# Patient Record
Sex: Female | Born: 1970
Health system: Southern US, Community
[De-identification: ages and names within clinical notes are randomized; demographics above are authoritative.]

## PROBLEM LIST (undated history)

## (undated) DIAGNOSIS — J45909 Unspecified asthma, uncomplicated: Secondary | ICD-10-CM

## (undated) DIAGNOSIS — J302 Other seasonal allergic rhinitis: Secondary | ICD-10-CM

## (undated) DIAGNOSIS — K219 Gastro-esophageal reflux disease without esophagitis: Secondary | ICD-10-CM

## (undated) HISTORY — PX: ABDOMINAL HYSTERECTOMY: SHX81

## (undated) HISTORY — PX: BLADDER SURGERY: SHX569

## (undated) HISTORY — PX: OTHER SURGICAL HISTORY: SHX169

## (undated) HISTORY — PX: BACK SURGERY: SHX140

## (undated) HISTORY — DX: Unspecified asthma, uncomplicated: J45.909

---

## 2003-03-09 ENCOUNTER — Encounter: Payer: Self-pay | Admitting: Otolaryngology

## 2003-03-09 ENCOUNTER — Ambulatory Visit (HOSPITAL_COMMUNITY): Admission: RE | Admit: 2003-03-09 | Discharge: 2003-03-09 | Payer: Self-pay | Admitting: Otolaryngology

## 2003-10-21 ENCOUNTER — Ambulatory Visit (HOSPITAL_COMMUNITY): Admission: RE | Admit: 2003-10-21 | Discharge: 2003-10-21 | Payer: Self-pay | Admitting: Family Medicine

## 2004-06-22 ENCOUNTER — Ambulatory Visit (HOSPITAL_COMMUNITY): Admission: RE | Admit: 2004-06-22 | Discharge: 2004-06-22 | Payer: Self-pay | Admitting: Internal Medicine

## 2004-07-15 ENCOUNTER — Ambulatory Visit (HOSPITAL_COMMUNITY): Admission: RE | Admit: 2004-07-15 | Discharge: 2004-07-15 | Payer: Self-pay | Admitting: Internal Medicine

## 2004-09-23 ENCOUNTER — Ambulatory Visit (HOSPITAL_COMMUNITY): Admission: RE | Admit: 2004-09-23 | Discharge: 2004-09-23 | Payer: Self-pay | Admitting: Family Medicine

## 2004-10-30 ENCOUNTER — Ambulatory Visit: Payer: Self-pay | Admitting: Pulmonary Disease

## 2004-10-30 ENCOUNTER — Ambulatory Visit: Admission: RE | Admit: 2004-10-30 | Discharge: 2004-10-30 | Payer: Self-pay | Admitting: Family Medicine

## 2005-05-18 ENCOUNTER — Ambulatory Visit: Payer: Self-pay | Admitting: Psychiatry

## 2008-08-25 ENCOUNTER — Ambulatory Visit (HOSPITAL_COMMUNITY): Admission: RE | Admit: 2008-08-25 | Discharge: 2008-08-25 | Payer: Self-pay | Admitting: Family Medicine

## 2009-05-12 ENCOUNTER — Ambulatory Visit: Payer: Self-pay | Admitting: Orthopedic Surgery

## 2009-05-12 DIAGNOSIS — Q762 Congenital spondylolisthesis: Secondary | ICD-10-CM | POA: Insufficient documentation

## 2009-05-12 DIAGNOSIS — M549 Dorsalgia, unspecified: Secondary | ICD-10-CM | POA: Insufficient documentation

## 2009-05-14 ENCOUNTER — Encounter: Payer: Self-pay | Admitting: Orthopedic Surgery

## 2009-05-27 ENCOUNTER — Encounter (HOSPITAL_COMMUNITY): Admission: RE | Admit: 2009-05-27 | Discharge: 2009-06-26 | Payer: Self-pay | Admitting: Orthopedic Surgery

## 2009-06-17 ENCOUNTER — Encounter: Payer: Self-pay | Admitting: Orthopedic Surgery

## 2009-06-23 ENCOUNTER — Ambulatory Visit: Payer: Self-pay | Admitting: Orthopedic Surgery

## 2009-07-27 ENCOUNTER — Ambulatory Visit (HOSPITAL_BASED_OUTPATIENT_CLINIC_OR_DEPARTMENT_OTHER): Admission: RE | Admit: 2009-07-27 | Discharge: 2009-07-27 | Payer: Self-pay | Admitting: Urology

## 2009-12-04 HISTORY — PX: HIGH RISK BREAST EXCISION: SHX6773

## 2009-12-27 ENCOUNTER — Ambulatory Visit (HOSPITAL_COMMUNITY): Admission: RE | Admit: 2009-12-27 | Discharge: 2009-12-27 | Payer: Self-pay | Admitting: Family Medicine

## 2010-05-12 ENCOUNTER — Inpatient Hospital Stay (HOSPITAL_COMMUNITY): Admission: RE | Admit: 2010-05-12 | Discharge: 2010-05-14 | Payer: Self-pay | Admitting: Neurosurgery

## 2010-10-04 ENCOUNTER — Ambulatory Visit (HOSPITAL_COMMUNITY): Admission: RE | Admit: 2010-10-04 | Discharge: 2010-10-04 | Payer: Self-pay | Admitting: Family Medicine

## 2010-10-19 ENCOUNTER — Ambulatory Visit (HOSPITAL_COMMUNITY): Admission: RE | Admit: 2010-10-19 | Discharge: 2010-10-19 | Payer: Self-pay | Admitting: Family Medicine

## 2010-10-24 ENCOUNTER — Encounter: Admission: RE | Admit: 2010-10-24 | Discharge: 2010-10-24 | Payer: Self-pay | Admitting: Family Medicine

## 2010-11-21 ENCOUNTER — Ambulatory Visit
Admission: RE | Admit: 2010-11-21 | Discharge: 2010-11-21 | Payer: Self-pay | Source: Home / Self Care | Attending: General Surgery | Admitting: General Surgery

## 2010-11-21 ENCOUNTER — Encounter
Admission: RE | Admit: 2010-11-21 | Discharge: 2010-11-21 | Payer: Self-pay | Source: Home / Self Care | Attending: General Surgery | Admitting: General Surgery

## 2010-12-21 ENCOUNTER — Ambulatory Visit: Payer: Self-pay | Admitting: Oncology

## 2011-01-05 NOTE — Assessment & Plan Note (Signed)
Summary: 6 WK RE-CK BACK FOL'G PT/BCBS/CAF   Primary Provider:  Dr Lubertha South    History of Present Illness: I saw Brenda [name] in the office today for a followup visit.  She is a 40 years old woman with the complaint of:  6 week recheck on back after PT and taking neurontin.  Doing better, has been discharged from PT.  Neurontin helps.  No radiaiton of pain anymore.   HISTORY 40 year old female with LEFT hip pain for the past 5 months.  Patient does note history of various episodes of lower back pain as well.  Presents now with pain in the LEFT hip radiating down the LEFT thigh and pain over the LEFT greater trochanter with some aching there.  She denies groin pain, numbness or tingling.  It does bother her to lie on the LEFT side.  Denies stiffness.  She tried some ibuprofen 600 mg at night as well as a Dosepak did not get much relief   She is employed at [employer] and does a lot of lifting.  She had scoliosis as a child.   Allergies: No Known Drug Allergies  Review of Systems Neuro:  Complains of unsteady walking; denies numbness. MS:  Denies joint pain.   Detailed Back/Spine Exam  General:    Well-developed, well-nourished, in no acute distress; alert and oriented x 3.    Gait:    Normal heel-toe gait pattern bilaterally.    Skin:    Intact with no erythema; no scarring.    Inspection:    plantigrade foot with normal alignment of leg, ankle, hindfoot, and foot.   Palpation:    tender over the left lower back   Vascular:    dorsalis pedis and posterior tibial pulses 2+ and symmetric, capillary refill < 2 seconds, normal hair pattern, no evidence of ischemia.   Lumbosacral Exam:  Inspection-deformity:    Normal Palpation-spinal tenderness:  Abnormal Lying Straight Leg Raise:    Right:  negative    Left:  negative Toe Walking:    Right:  normal    Left:  normal Heel Walking:    Right:  normal    Left:  normal   Impression & Recommendations:   Problem # 1:  SPONDYLOLITHESIS (EAV-409.81)  Orders: Est. Patient Level III (19147)  Problem # 2:  SPONDYLOLYSIS (ICD-756.11)  Orders: Est. Patient Level III (82956)  Problem # 3:  BACK PAIN (ICD-724.5)  Orders: Est. Patient Level III (21308)  Patient Instructions: 1)  continue medicne as you can  2)  continue exercises at home  3)  schedule follow ups as  needed  Prescriptions: NEURONTIN 100 MG CAPS (GABAPENTIN) one by mouth tonite 2 tabs thurs nite 3 tabs friday nite and continue  #90 x 5   Entered and Authorized by:   Fuller Canada MD   Signed by:   Fuller Canada MD on 06/23/2009   Method used:   Print then Give to Patient   RxID:   [id number]

## 2011-01-05 NOTE — Letter (Signed)
Summary: *Orthopedic Consult Note  [facility]  [address] [place] [address]  [place], [place] [address]   Phone: [phone]  Fax: [phone]    Re:    NADEZHDA POLLITT DOB:    06-25-1971   Dear: she   Thank you for requesting that we see the above patient for consultation.  A copy of the detailed office note will be sent under separate cover, for your review.  Evaluation today is consistent with:  1)  SPONDYLOLITHESIS (ICD-756.12) 2)  SPONDYLOLYSIS (ICD-756.11) 3)  BACK PAIN (ICD-724.5)   Our recommendation is for: physical therapy and Neurontin titrated from 100 mg up to 300 mg at night to start the need to go to 3 times q. day dosing were switched her Lyrica Neurontin not tolerated  Physical therapy very important  No surgical treatment at this time but MRI may be needed in the future if pain becomes more symptomatic below the knee.       Thank you for this opportunity to look after your patient.  Sincerely,   Terrance Mass. MD.

## 2011-01-05 NOTE — Letter (Signed)
Summary: History form  History form   Imported By: Jacklynn Ganong 05/14/2009 09:18:13  _____________________________________________________________________  External Attachment:    Type:   Image     Comment:   External Document

## 2011-01-05 NOTE — Miscellaneous (Signed)
Summary: PT Discharge summary  PT Discharge summary   Imported By: Jacklynn Ganong 06/24/2009 15:23:08  _____________________________________________________________________  External Attachment:    Type:   Image     Comment:   External Document

## 2011-01-05 NOTE — Assessment & Plan Note (Signed)
Summary: left hip pain needs xr/bcbs/luking/bsf   Vital Signs:  Patient profile:   40 year old female Weight:      203 pounds Pulse (ortho):   70 / minute Resp:     16 per minute  Vitals Entered By: Fuller Canada MD (May 12, 2009 10:15 AM)  Visit Type:  Initial visit Primary Provider:  Dr Lubertha South   CC:  left hip pain.  History of Present Illness: 40 year old female with LEFT hip pain for the past 5 months.  Patient does note history of various episodes of lower back pain as well.  Presents now with pain in the LEFT hip radiating down the LEFT thigh and pain over the LEFT greater trochanter with some aching there.  She denies groin pain, numbness or tingling.  It does bother her to lie on the LEFT side.  Denies stiffness.  She tried some ibuprofen 600 mg at night as well as a Dosepak did not get much relief   She is employed at [employer] and does a lot of lifting.  She had scoliosis as a child.   Preventive Screening-Counseling & Management  Alcohol-Tobacco     Alcohol drinks/day: 0     Smoking Status: never  Caffeine-Diet-Exercise     Caffeine use/day: 3  Allergies (verified): No Known Drug Allergies  Past History:  Past Medical History: acid reflux  Past Surgical History: partial hysterectomy  Family History: na  Social History: Patient is married.  Lowes Home improvementAlcohol drinks/day:  0 Smoking Status:  never Caffeine use/day:  3  Review of Systems General:  Complains of fatigue; denies weight loss, weight gain, fever, and chills. Cardiac :  Denies chest pain, angina, heart attack, heart failure, poor circulation, blood clots, and phlebitis. Resp:  Denies short of breath, difficulty breathing, COPD, cough, and pneumonia. GI:  Denies nausea, vomiting, diarrhea, constipation, difficulty swallowing, ulcers, GERD, and reflux. GU:  Denies kidney failure, kidney transplant, kidney stones, burning, poor stream, testicular cancer, blood in urine,  and . Neuro:  Denies headache, dizziness, migraines, numbness, weakness, tremor, and unsteady walking. MS:  Complains of joint pain and joint swelling; denies rheumatoid arthritis, gout, bone cancer, osteoporosis, and . Endo:  Denies thyroid disease, goiter, and diabetes. Psych:  Complains of depression and anxiety; denies mood swings, panic attack, bipolar, and schizophrenia. Derm:  Denies eczema, cancer, and itching. EENT:  Denies poor vision, cataracts, glaucoma, poor hearing, vertigo, ears ringing, sinusitis, hoarseness, toothaches, and bleeding gums. Immunology:  Complains of seasonal allergies and sinus problems; denies allergic to bee stings. Lymphatic:  Denies lymph node cancer and lymph edema.  Physical Exam  Additional Exam:  She is a pleasant slightly overweight well-developed well-nourished female with no form and these.  She has normal temperature to her extremities within normal pulses  She is awake alert and oriented x3 with normal mood  Reflexes and coordination are normal as is sensation.  Skin shows no lesions rashes or ulcers  Lymph nodes negative  Gait analysis is normal  Lumbar exam shows tenderness in the lower lumbar segments and LEFT buttock.  Spinal motion is preserved without spasm or tightness.  Lower extremities show normal range of motion, normal strength, no instability, no tenderness except over the LEFT greater trochanter  Her extremities normal.   Impression & Recommendations:  Problem # 1:  SPONDYLOLITHESIS (ZOX-096.04)  Orders: Physical Therapy Referral (PT) New Patient Level III (54098)  Problem # 2:  SPONDYLOLYSIS (JXB-147.82)  Orders: Physical Therapy Referral (PT) New  Patient Level III (16109)  Problem # 3:  BACK PAIN (ICD-724.5) x-rays lumbar spine 3 views There is spondylolysis at L5-S1 as well as spondylolisthesis grade 1, there is rounding of the sacral superior vertebrae  Impression spondylolysis with grade 1  spondylolisthesis and some deformity of the S1 superior endplate.  She has a symptomatic spondylolysis listhesis and back pain with leg pain which is above the knee at this time.  We should be able to manage with physical therapy and Neurontin titrated up to 300 mg.  Follow up 6 weeks.  May need MRI in the future if pain becomes more symptomatic or goes below the knee.   Orders: Physical Therapy Referral (PT) New Patient Level III (60454) Lumbosacral Spine ,2/3 views (72100)  Medications Added to Medication List This Visit: 1)  Neurontin 100 Mg Caps (Gabapentin) .... One by mouth tonite 2 tabs thurs nite 3 tabs friday nite and continue  Patient Instructions: 1)  Neurontin take 100 mg at night the 1st nigth then 2 the next night then 3 tabs at night  2)  PT  3)  return in 6 weeks  Prescriptions: NEURONTIN 100 MG CAPS (GABAPENTIN) one by mouth tonite 2 tabs thurs nite 3 tabs friday nite and continue  #90 x 0   Entered and Authorized by:   Fuller Canada MD   Signed by:   Fuller Canada MD on 05/12/2009   Method used:   Handwritten   RxID:   [id number]

## 2011-01-17 ENCOUNTER — Encounter (HOSPITAL_BASED_OUTPATIENT_CLINIC_OR_DEPARTMENT_OTHER): Payer: BC Managed Care – PPO | Admitting: Oncology

## 2011-01-17 DIAGNOSIS — Z8049 Family history of malignant neoplasm of other genital organs: Secondary | ICD-10-CM

## 2011-01-17 DIAGNOSIS — N6089 Other benign mammary dysplasias of unspecified breast: Secondary | ICD-10-CM

## 2011-01-17 DIAGNOSIS — R920 Mammographic microcalcification found on diagnostic imaging of breast: Secondary | ICD-10-CM

## 2011-02-13 LAB — CBC
HCT: 38.4 % (ref 36.0–46.0)
Hemoglobin: 12.8 g/dL (ref 12.0–15.0)
MCH: 28.1 pg (ref 26.0–34.0)
MCHC: 33.3 g/dL (ref 30.0–36.0)
MCV: 84.2 fL (ref 78.0–100.0)
Platelets: 316 10*3/uL (ref 150–400)
RBC: 4.56 MIL/uL (ref 3.87–5.11)
RDW: 13.5 % (ref 11.5–15.5)
WBC: 7.7 10*3/uL (ref 4.0–10.5)

## 2011-02-13 LAB — BASIC METABOLIC PANEL
BUN: 7 mg/dL (ref 6–23)
CO2: 27 mEq/L (ref 19–32)
Calcium: 8.8 mg/dL (ref 8.4–10.5)
Chloride: 106 mEq/L (ref 96–112)
Creatinine, Ser: 0.85 mg/dL (ref 0.4–1.2)
GFR calc Af Amer: >60 mL/min (ref >60)
GFR calc non Af Amer: >60 mL/min (ref >60)
Glucose, Bld: 104 mg/dL — ABNORMAL HIGH (ref 70–99)
Potassium: 3.8 mEq/L (ref 3.5–5.1)
Sodium: 139 mEq/L (ref 135–145)

## 2011-02-13 LAB — DIFFERENTIAL
Basophils Absolute: 0.1 10*3/uL (ref 0.0–0.1)
Basophils Relative: 1 % (ref 0–1)
Eosinophils Absolute: 0.2 10*3/uL (ref 0.0–0.7)
Eosinophils Relative: 3 % (ref 0–5)
Lymphocytes Relative: 25 % (ref 12–46)
Lymphs Abs: 1.9 10*3/uL (ref 0.7–4.0)
Monocytes Absolute: 0.5 10*3/uL (ref 0.1–1.0)
Monocytes Relative: 6 % (ref 3–12)
Neutro Abs: 5.1 10*3/uL (ref 1.7–7.7)
Neutrophils Relative %: 66 % (ref 43–77)

## 2011-02-20 LAB — CBC
HCT: 41.2 % (ref 36.0–46.0)
Hemoglobin: 14.3 g/dL (ref 12.0–15.0)
MCHC: 34.7 g/dL (ref 30.0–36.0)
MCV: 88.5 fL (ref 78.0–100.0)
Platelets: 286 10*3/uL (ref 150–400)
RBC: 4.66 MIL/uL (ref 3.87–5.11)
RDW: 13.8 % (ref 11.5–15.5)
WBC: 8.3 10*3/uL (ref 4.0–10.5)

## 2011-02-20 LAB — TYPE AND SCREEN
ABO/RH(D): A POS
Antibody Screen: NEGATIVE

## 2011-02-20 LAB — ABO/RH: ABO/RH(D): A POS

## 2011-02-20 LAB — SURGICAL PCR SCREEN
MRSA, PCR: NEGATIVE
Staphylococcus aureus: POSITIVE — AB

## 2011-03-11 LAB — APTT: aPTT: 27 seconds (ref 24–37)

## 2011-03-11 LAB — CBC
HCT: 39.6 % (ref 36.0–46.0)
Hemoglobin: 13.4 g/dL (ref 12.0–15.0)
MCHC: 33.9 g/dL (ref 30.0–36.0)
MCV: 89.2 fL (ref 78.0–100.0)
Platelets: 348 10*3/uL (ref 150–400)
RBC: 4.44 MIL/uL (ref 3.87–5.11)
RDW: 13.9 % (ref 11.5–15.5)
WBC: 8.5 10*3/uL (ref 4.0–10.5)

## 2011-03-11 LAB — PROTIME-INR
INR: 1 (ref 0.00–1.49)
Prothrombin Time: 12.9 seconds (ref 11.6–15.2)

## 2011-03-11 LAB — ABO/RH: ABO/RH(D): A POS

## 2011-03-11 LAB — TYPE AND SCREEN
ABO/RH(D): A POS
Antibody Screen: NEGATIVE

## 2011-04-18 NOTE — Op Note (Signed)
Brenda [name], Brenda [name]                 ACCOUNT NO.:  [id number]   MEDICAL RECORD NO.:  [id number]          PATIENT TYPE:  AMB   LOCATION:  [facility]                         FACILITY:  [facility]   PHYSICIAN:  Martina Sinner, MD DATE OF BIRTH:  1971-02-18   DATE OF PROCEDURE:  DATE OF DISCHARGE:                               OPERATIVE REPORT   PREOPERATIVE DIAGNOSIS:  Stress urinary incontinence.   POSTOPERATIVE DIAGNOSIS:  Stress urinary incontinence.   SURGERY:  Sling, cystourethropexy plus cystoscopy (after sling  cystourethropexy, SPARC).   Ms. [name] has stress urinary incontinence.  She has a mixed  component.  She is refractory to anticholinergics.  She was prepped and  draped in the usual fashion.  Extra care was taken to leg positioning to  minimize risk compartment syndrome, neuropathy and DVT.  She was given  preoperative antibiotics and her lab tests were normal.   A weighted vaginal speculum __________ were utilized.  Two 1-cm  incisions were made one fingerbreadth above the symphysis pubis 1.5 cm  lateral to the midline.   A 2 cm incision was made underneath the mid urethra after instilling 7  mL of lidocaine/epinephrine mixture.  I dissected sharply to the  urethrovesical angle bilaterally.  With the bladder empty, I passed a  SPARC needle on top of and behind the back of the symphysis pubis on the  pulp of my index finger bilaterally, staying a little bit lateral.   I then cystoscoped the patient.  There was no injury to the bladder or  urethra and there was excellent efflux of blue dye bilaterally.  There  was no indentation of bladder with wiggling of the trocars.   With the bladder empty, I attached the [facility] sling and brought it up to  the retropubic space.  I tensioned over the fat part of a moderate-sized  Kelly clamp.  I cut below the blue dots and removed the sheath.  I was  happy with the position and hypermobility and lack of spring back of the  mid  urethral sling.  Copious irrigation was utilized with antibiotics.   I closed the vaginal incision with running 2-0 Vicryl on a CT1 needle  followed by two interrupted sutures.  I cut the sling below the skin  level in the suprapubic area used one interrupted 4-0 Vicryl followed by  Dermabond.   A vaginal pack was inserted at the end of the case.  The patient was  taken to the recovery room.  I was very pleased with the procedure and  hopefully it will reach her treatment goals.           ______________________________  Martina Sinner, MD  Electronically Signed    SAM/MEDQ  D:  07/27/2009  T:  07/27/2009  Job:  [id number]

## 2011-04-21 NOTE — Procedures (Signed)
Brenda [name], Brenda [name]                 ACCOUNT NO.:  [id number]   MEDICAL RECORD NO.:  [id number]          PATIENT TYPE:  OUT   LOCATION:  SLEEP LAB                     FACILITY:  [facility]   PHYSICIAN:  Marcelyn Bruins, M.D. [facility] DATE OF BIRTH:  06/18/1971   DATE OF STUDY:  10/30/2004                              NOCTURNAL POLYSOMNOGRAM   REFERRING PHYSICIAN:  Lubertha South   INDICATION FOR THIS STUDY:  Hypersomnia with sleep apnea.   SLEEP ARCHITECTURE:  The patient had a total sleep time of 342 minutes with  no slow wave sleep and normal quantity of REM.  Sleep onset latency was  prolonged at 69 minutes and REM onset was fairly quick.  Sleep efficiency  was 82%.   IMPRESSION:  1.  Very mild obstructive sleep apnea/hypopnea syndrome with a respiratory      disturbance of five events per hour and O2 desaturation as low as 90%.      The events were clearly worse in the supine position and with REM.  The      patient did not meet split night criteria due to the small numbers of      events.  2.  Moderate snoring noted.  3.  No clinically significant cardiac arrhythmia.  4.  Large numbers of periodic leg movements with very little sleep      disruption.  However, clinical correlation is suggested.      KC/MEDQ  D:  11/14/2004 12:43:48  T:  11/14/2004 14:13:55  Job:  [id number]

## 2011-05-04 ENCOUNTER — Other Ambulatory Visit (INDEPENDENT_AMBULATORY_CARE_PROVIDER_SITE_OTHER): Payer: Self-pay | Admitting: General Surgery

## 2011-05-04 DIAGNOSIS — C50911 Malignant neoplasm of unspecified site of right female breast: Secondary | ICD-10-CM

## 2011-05-11 ENCOUNTER — Other Ambulatory Visit (INDEPENDENT_AMBULATORY_CARE_PROVIDER_SITE_OTHER): Payer: Self-pay | Admitting: General Surgery

## 2011-05-11 ENCOUNTER — Ambulatory Visit
Admission: RE | Admit: 2011-05-11 | Discharge: 2011-05-11 | Disposition: A | Discharge status: Home / Self Care | Payer: BC Managed Care – PPO | Source: Ambulatory Visit | Attending: General Surgery | Admitting: General Surgery

## 2011-05-11 DIAGNOSIS — C50911 Malignant neoplasm of unspecified site of right female breast: Secondary | ICD-10-CM

## 2011-05-11 DIAGNOSIS — N6099 Unspecified benign mammary dysplasia of unspecified breast: Secondary | ICD-10-CM

## 2011-07-07 ENCOUNTER — Ambulatory Visit (HOSPITAL_COMMUNITY)
Admission: RE | Admit: 2011-07-07 | Discharge: 2011-07-07 | Disposition: A | Discharge status: Home / Self Care | Payer: BC Managed Care – PPO | Source: Ambulatory Visit | Attending: Family Medicine | Admitting: Family Medicine

## 2011-07-07 ENCOUNTER — Other Ambulatory Visit: Payer: Self-pay | Admitting: Family Medicine

## 2011-07-07 DIAGNOSIS — M25579 Pain in unspecified ankle and joints of unspecified foot: Secondary | ICD-10-CM | POA: Insufficient documentation

## 2011-07-07 DIAGNOSIS — M79671 Pain in right foot: Secondary | ICD-10-CM

## 2011-07-07 DIAGNOSIS — S99929A Unspecified injury of unspecified foot, initial encounter: Secondary | ICD-10-CM | POA: Insufficient documentation

## 2011-07-07 DIAGNOSIS — S8990XA Unspecified injury of unspecified lower leg, initial encounter: Secondary | ICD-10-CM | POA: Insufficient documentation

## 2011-07-07 DIAGNOSIS — W208XXA Other cause of strike by thrown, projected or falling object, initial encounter: Secondary | ICD-10-CM | POA: Insufficient documentation

## 2011-07-07 DIAGNOSIS — S99919A Unspecified injury of unspecified ankle, initial encounter: Secondary | ICD-10-CM | POA: Insufficient documentation

## 2011-12-11 IMAGING — RF DG LUMBAR SPINE 2-3V
1 series · 3 of 3 positions shown · non-contrast
Comparison: Intraoperative images 05/12/2010

CLINICAL DATA: L5-S1 posterior fusion.

LUMBAR SPINE - 2-3 VIEW

[Series 1: run · 3 of 3 slices shown]
[im 1/3]
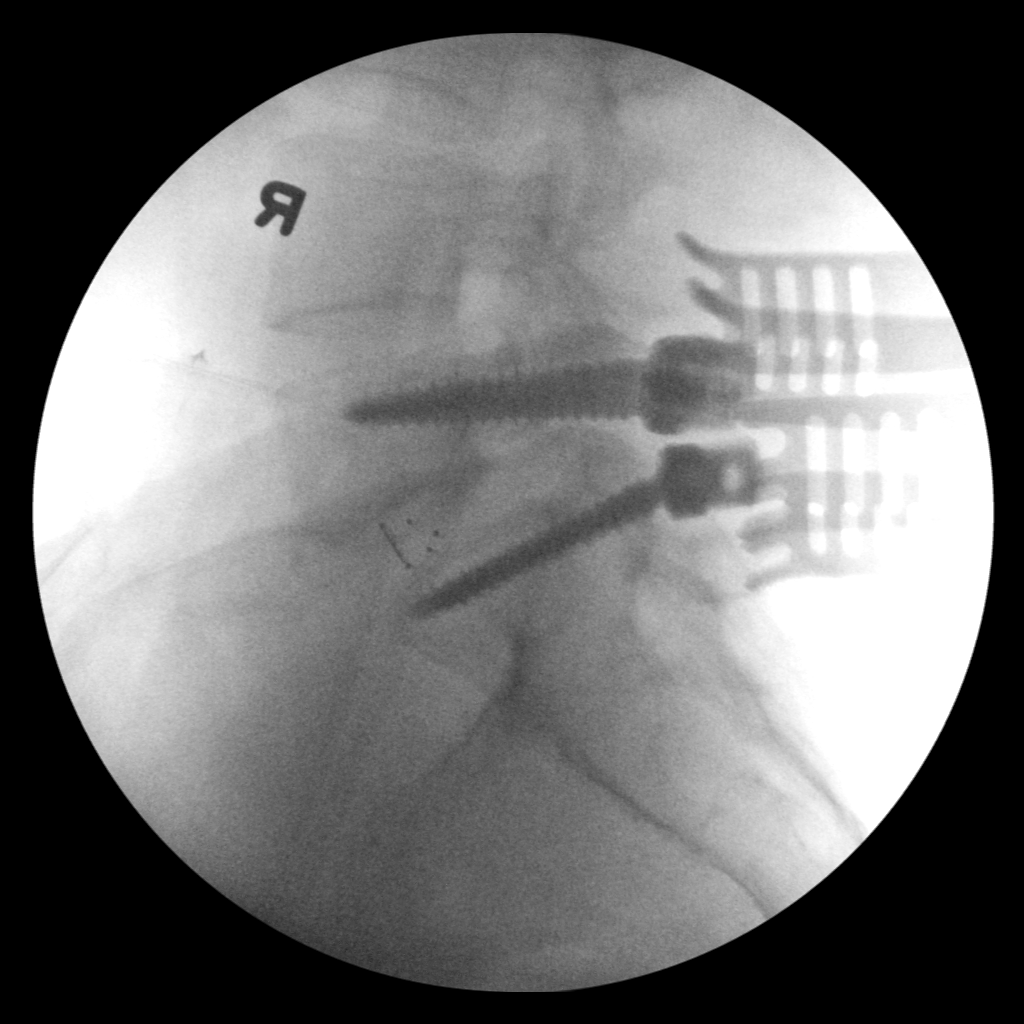
[im 2/3]
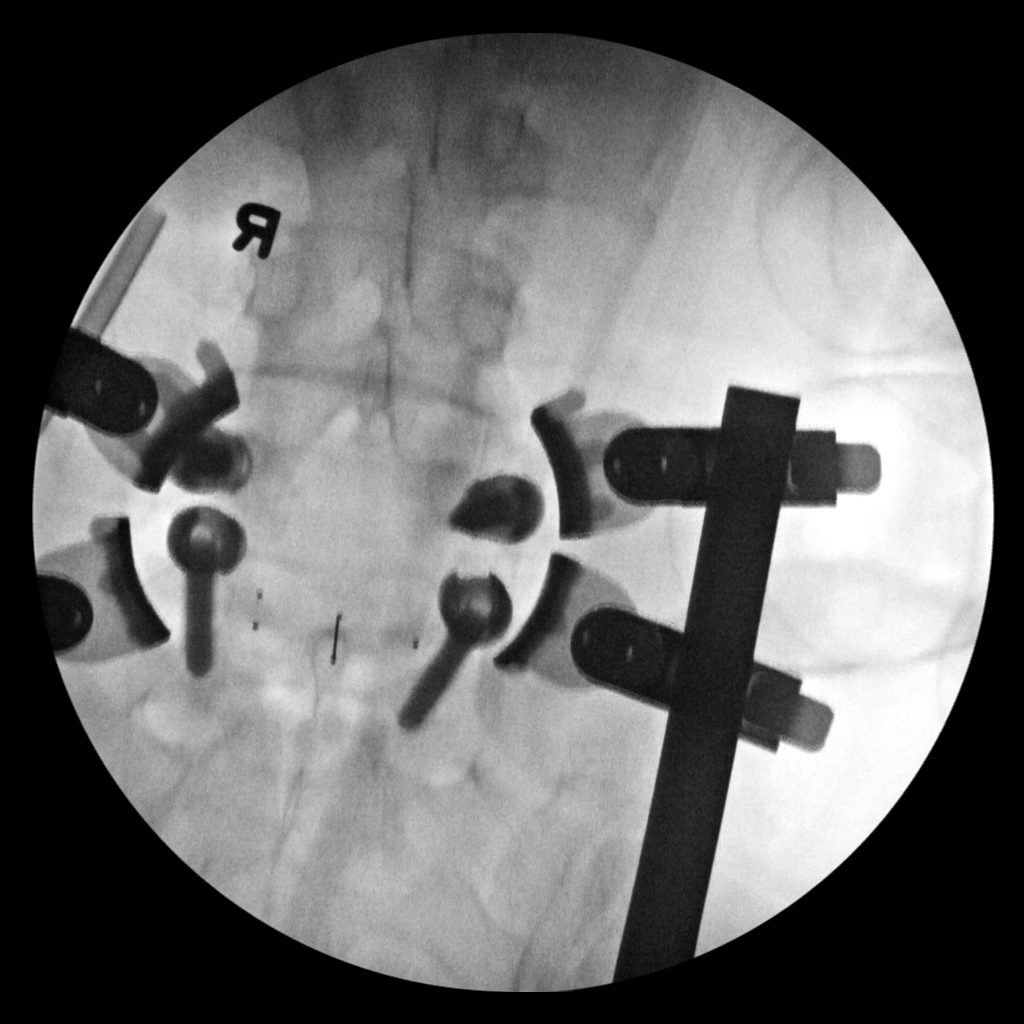
[im 3/3]
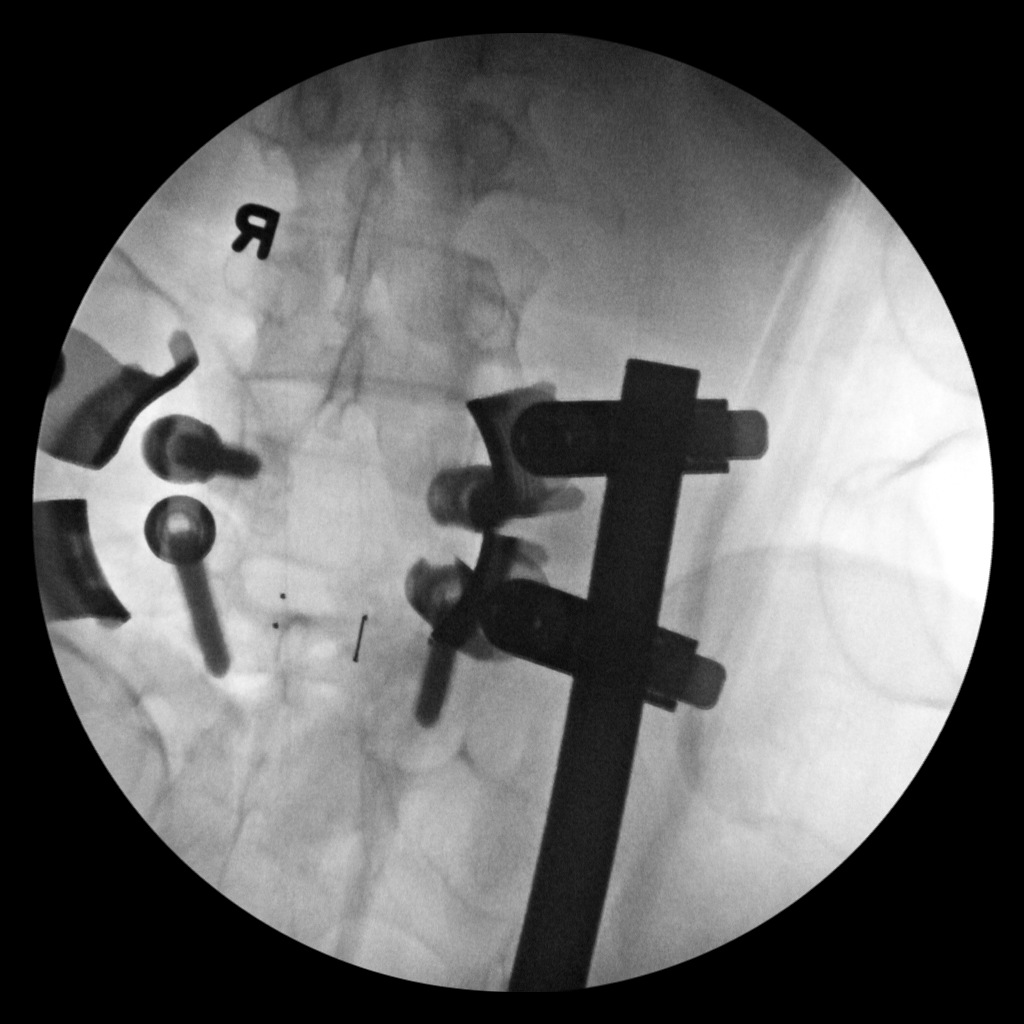

[3 of 3 positions shown; findings below may reference images not displayed]

FINDINGS: Two spot images demonstrate placement of pedicle screws
at L5 and S1.  Slight anterolisthesis of L5 on S1.
IMPRESSION: Pedicle placement at L5 and S1.

## 2011-12-11 IMAGING — CR DG LUMBAR SPINE 1V
1 series · 1 of 1 positions shown · non-contrast
Comparison: 12/27/2009 MRI

CLINICAL DATA: L5-S1 PLIF

LUMBAR SPINE - 1 VIEW

[view not recorded]
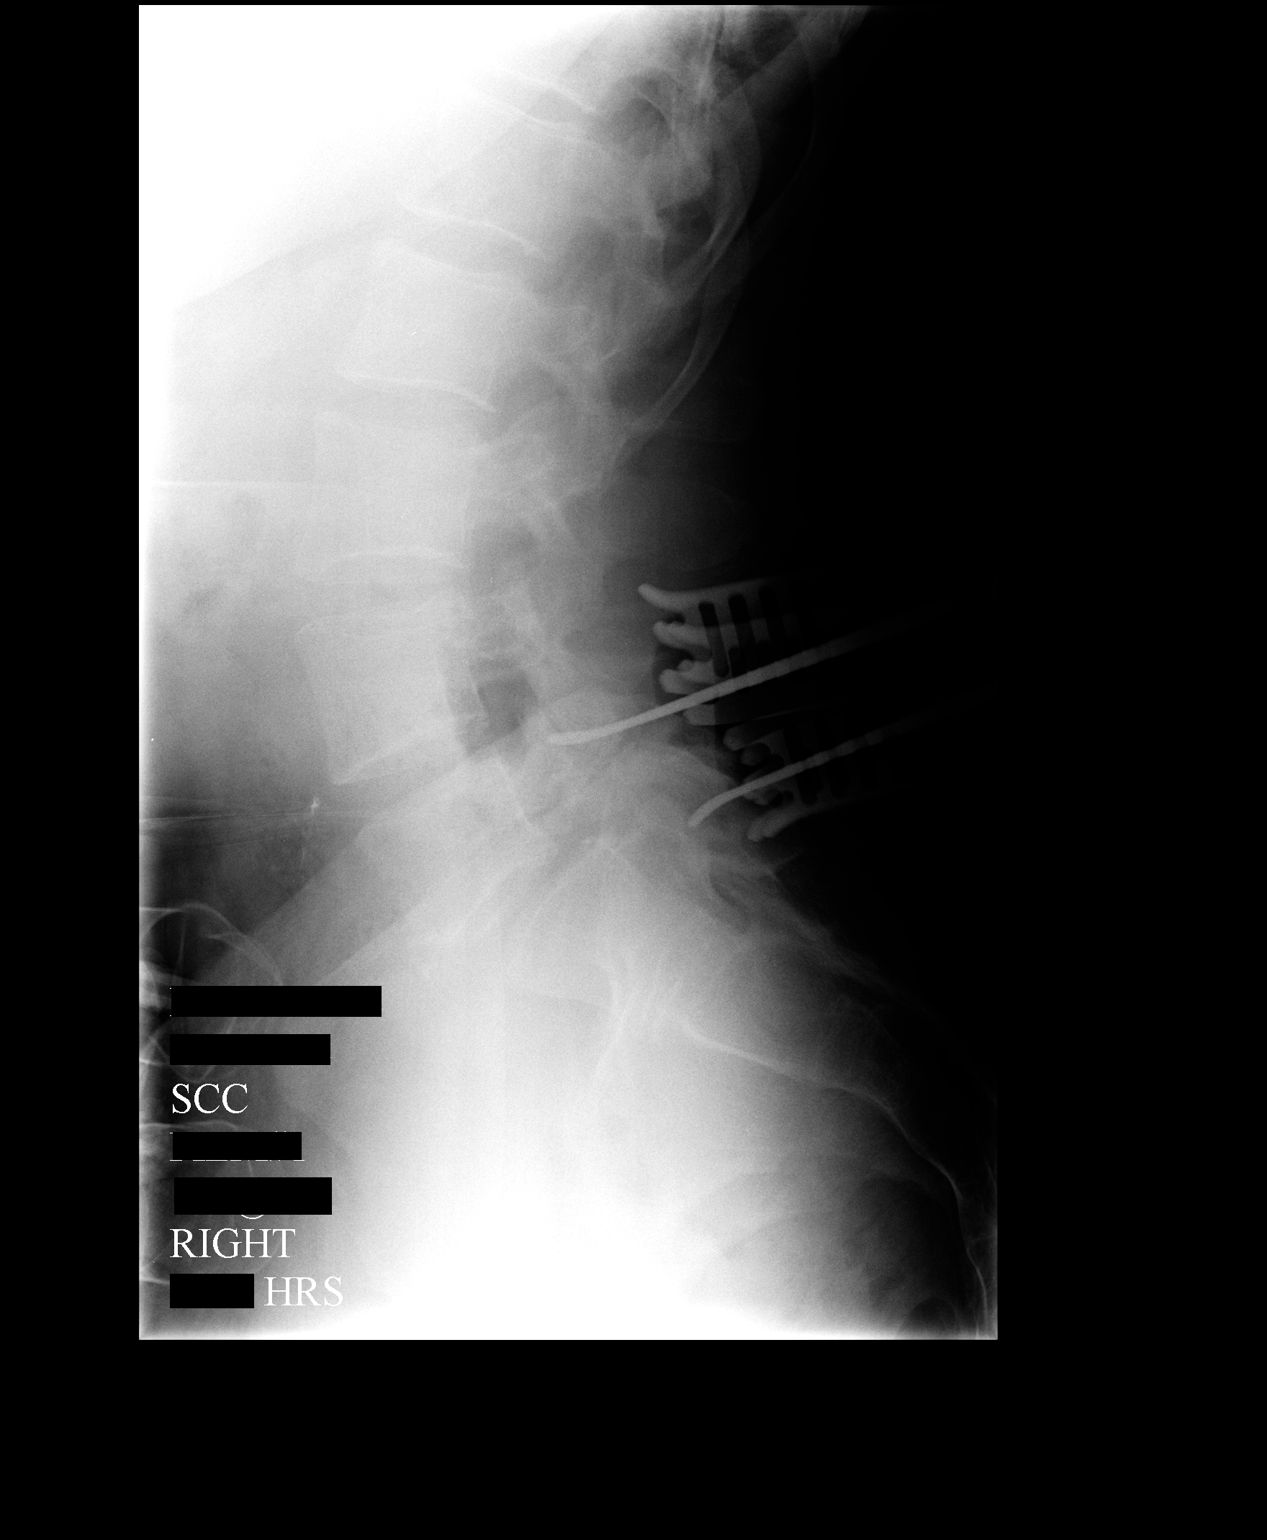

[1 of 1 positions shown; findings below may reference images not displayed]

FINDINGS: A single lateral intraoperative view of the lumbar spine
is submitted postoperatively for interpretation.
Two posterior metallic probes are identified, the superior one
directed at the L4-L5 interspace and the inferior one directed
towards the upper S1 body.
Grade 1 anterolisthesis of L5 on S1 is again noted.
IMPRESSION: Posterior localizers as described.

## 2012-08-06 ENCOUNTER — Other Ambulatory Visit: Payer: Self-pay | Admitting: Family Medicine

## 2012-08-06 DIAGNOSIS — Z1231 Encounter for screening mammogram for malignant neoplasm of breast: Secondary | ICD-10-CM

## 2012-08-09 ENCOUNTER — Ambulatory Visit
Admission: RE | Admit: 2012-08-09 | Discharge: 2012-08-09 | Disposition: A | Discharge status: Home / Self Care | Payer: BC Managed Care – PPO | Source: Ambulatory Visit | Attending: Family Medicine | Admitting: Family Medicine

## 2012-08-09 DIAGNOSIS — Z1231 Encounter for screening mammogram for malignant neoplasm of breast: Secondary | ICD-10-CM

## 2013-10-10 ENCOUNTER — Encounter: Payer: Self-pay | Admitting: Nurse Practitioner

## 2013-10-10 ENCOUNTER — Ambulatory Visit (INDEPENDENT_AMBULATORY_CARE_PROVIDER_SITE_OTHER): Payer: BC Managed Care – PPO | Admitting: Nurse Practitioner

## 2013-10-10 VITALS — BP 148/80 | Temp 98.8°F | Ht 67.0 in | Wt 232.0 lb

## 2013-10-10 DIAGNOSIS — J310 Chronic rhinitis: Secondary | ICD-10-CM

## 2013-10-10 MED ORDER — METHYLPREDNISOLONE ACETATE 40 MG/ML IJ SUSP
40.0000 mg | Freq: Once | INTRAMUSCULAR | Status: AC
  Administered 2013-10-10: 40 mg via INTRAMUSCULAR

## 2013-10-10 MED ORDER — FLUTICASONE PROPIONATE 50 MCG/ACT NA SUSP: 2.0000 | Freq: Every day | NASAL | Status: DC

## 2013-10-10 MED ORDER — CEFUROXIME AXETIL 500 MG PO TABS: 500.0000 mg | ORAL_TABLET | Freq: Two times a day (BID) | ORAL | Status: DC

## 2013-10-10 MED ORDER — PREDNISONE 20 MG PO TABS: ORAL_TABLET | ORAL | Status: DC

## 2013-10-10 NOTE — Patient Instructions (Signed)
Continue Zyrtec Zaditor eye drops ( generic)

## 2013-10-14 ENCOUNTER — Encounter: Payer: Self-pay | Admitting: Nurse Practitioner

## 2013-10-14 ENCOUNTER — Other Ambulatory Visit: Payer: Self-pay

## 2013-10-14 DIAGNOSIS — J310 Chronic rhinitis: Secondary | ICD-10-CM | POA: Insufficient documentation

## 2013-10-14 DIAGNOSIS — Z1231 Encounter for screening mammogram for malignant neoplasm of breast: Secondary | ICD-10-CM

## 2013-10-14 DIAGNOSIS — T485X5A Adverse effect of other anti-common-cold drugs, initial encounter: Secondary | ICD-10-CM | POA: Insufficient documentation

## 2013-10-14 NOTE — Assessment & Plan Note (Signed)
Medications  . methylPREDNISolone acetate (DEPO-MEDROL) injection 40 mg    Sig:   . cefUROXime (CEFTIN) 500 MG tablet    Sig: Take 1 tablet (500 mg total) by mouth 2 (two) times daily with a meal.    Dispense:  20 tablet    Refill:  0    Order Specific Question:  Supervising Provider    Answer:  Merlyn Albert [[id number]]  . predniSONE (DELTASONE) 20 MG tablet    Sig: 3 po qd x 3 d then 2 po qd x 3 d then 1 po qd x 3 d; start 11/8    Dispense:  18 tablet    Refill:  0    Order Specific Question:  Supervising Provider    Answer:  Merlyn Albert [[id number]]  . fluticasone (FLONASE) 50 MCG/ACT nasal spray    Sig: Place 2 sprays into both nostrils daily. Prn congestion    Dispense:  16 g    Refill:  5    Order Specific Question:  Supervising Provider    Answer:  Riccardo Dubin   Advised patient to stop Afrin nasal spray use immediately and to avoid using it in the future. Call back if symptoms worsen or persist. Explained that Flonase is for long-term use if needed.

## 2013-10-14 NOTE — Progress Notes (Signed)
Subjective:  Presents for complaints of intense head congestion over the past 2 months, seems to be worse over the past week. Has been using Afrin daily. Now only lasting about 4 hours before congestion comes back. Over the past week has started running a slight fever with frontal area headache. Nonproductive cough. Producing green mucus mainly in the mornings. No ear pain. No sore throat. Reflux is stable.  Objective:   BP 148/80  Temp(Src) 98.8 F (37.1 C) (Oral)  Ht 5\' 7"  (1.702 m)  Wt 232 lb (105.235 kg)  BMI 36.33 kg/m2  LMP 07/06/2004 NAD. Alert, oriented. TMs significant retraction, clear effusion. Pharynx injected with PND noted. Neck supple with mild soft nontender adenopathy. Nasal mucosa pale and very boggy. Lungs clear. Heart regular rate rhythm.  Assessment:Rhinitis - Plan: methylPREDNISolone acetate (DEPO-MEDROL) injection 40 mg  Rhinitis medicamentosa, initial encounter  Plan:  Meds ordered this encounter  Medications  . methylPREDNISolone acetate (DEPO-MEDROL) injection 40 mg    Sig:   . cefUROXime (CEFTIN) 500 MG tablet    Sig: Take 1 tablet (500 mg total) by mouth 2 (two) times daily with a meal.    Dispense:  20 tablet    Refill:  0    Order Specific Question:  Supervising Provider    Answer:  Merlyn Albert [[id number]]  . predniSONE (DELTASONE) 20 MG tablet    Sig: 3 po qd x 3 d then 2 po qd x 3 d then 1 po qd x 3 d; start 11/8    Dispense:  18 tablet    Refill:  0    Order Specific Question:  Supervising Provider    Answer:  Merlyn Albert [[id number]]  . fluticasone (FLONASE) 50 MCG/ACT nasal spray    Sig: Place 2 sprays into both nostrils daily. Prn congestion    Dispense:  16 g    Refill:  5    Order Specific Question:  Supervising Provider    Answer:  Riccardo Dubin   Advised patient to stop Afrin nasal spray use immediately and to avoid using it in the future. Call back if symptoms worsen or persist. Explained that Flonase is for long-term use if  needed.

## 2013-11-19 ENCOUNTER — Ambulatory Visit
Admission: RE | Admit: 2013-11-19 | Discharge: 2013-11-19 | Disposition: A | Discharge status: Home / Self Care | Payer: BC Managed Care – PPO | Source: Ambulatory Visit

## 2013-11-19 DIAGNOSIS — Z1231 Encounter for screening mammogram for malignant neoplasm of breast: Secondary | ICD-10-CM

## 2014-03-27 ENCOUNTER — Other Ambulatory Visit: Payer: Self-pay | Admitting: Nurse Practitioner

## 2014-04-01 ENCOUNTER — Ambulatory Visit (INDEPENDENT_AMBULATORY_CARE_PROVIDER_SITE_OTHER): Payer: BC Managed Care – PPO | Admitting: Family Medicine

## 2014-04-01 ENCOUNTER — Telehealth: Payer: Self-pay | Admitting: Family Medicine

## 2014-04-01 ENCOUNTER — Encounter: Payer: Self-pay | Admitting: Family Medicine

## 2014-04-01 ENCOUNTER — Ambulatory Visit (HOSPITAL_COMMUNITY)
Admission: RE | Admit: 2014-04-01 | Discharge: 2014-04-01 | Disposition: A | Discharge status: Home / Self Care | Payer: BC Managed Care – PPO | Source: Ambulatory Visit | Attending: Family Medicine | Admitting: Family Medicine

## 2014-04-01 VITALS — BP 112/80 | Temp 98.6°F | Ht 67.0 in | Wt 229.0 lb

## 2014-04-01 DIAGNOSIS — R918 Other nonspecific abnormal finding of lung field: Secondary | ICD-10-CM | POA: Insufficient documentation

## 2014-04-01 DIAGNOSIS — R05 Cough: Secondary | ICD-10-CM

## 2014-04-01 DIAGNOSIS — J309 Allergic rhinitis, unspecified: Secondary | ICD-10-CM

## 2014-04-01 DIAGNOSIS — R059 Cough, unspecified: Secondary | ICD-10-CM

## 2014-04-01 DIAGNOSIS — J45909 Unspecified asthma, uncomplicated: Secondary | ICD-10-CM

## 2014-04-01 DIAGNOSIS — J683 Other acute and subacute respiratory conditions due to chemicals, gases, fumes and vapors: Secondary | ICD-10-CM | POA: Insufficient documentation

## 2014-04-01 MED ORDER — METHYLPREDNISOLONE ACETATE 40 MG/ML IJ SUSP
40.0000 mg | Freq: Once | INTRAMUSCULAR | Status: AC
  Administered 2014-04-01: 40 mg via INTRAMUSCULAR

## 2014-04-01 MED ORDER — CEFDINIR 300 MG PO CAPS: 300.0000 mg | ORAL_CAPSULE | Freq: Two times a day (BID) | ORAL | Status: DC

## 2014-04-01 MED ORDER — ALBUTEROL SULFATE HFA 108 (90 BASE) MCG/ACT IN AERS: 2.0000 | INHALATION_SPRAY | Freq: Four times a day (QID) | RESPIRATORY_TRACT | Status: DC | PRN

## 2014-04-01 MED ORDER — PREDNISONE 20 MG PO TABS: ORAL_TABLET | ORAL | Status: DC

## 2014-04-01 MED ORDER — FLUTICASONE PROPIONATE 50 MCG/ACT NA SUSP: 2.0000 | Freq: Every day | NASAL | Status: DC

## 2014-04-01 MED ORDER — TRIAMCINOLONE ACETONIDE 0.1 % EX CREA: 1.0000 "application " | TOPICAL_CREAM | Freq: Two times a day (BID) | CUTANEOUS | Status: DC

## 2014-04-01 NOTE — Progress Notes (Signed)
Patient notified and verbalized understanding of the test results. No further questions. 

## 2014-04-01 NOTE — Progress Notes (Signed)
   Subjective:    Patient ID: Brenda [name], female    DOB: [birth year], 43 y.o.   MRN: [id number]  Sinusitis This is a new problem. The current episode started more than 1 month ago. The problem has been gradually worsening since onset. There has been no fever. Her pain is at a severity of 7/10. The pain is moderate. Associated symptoms include congestion and coughing. (Wheezing) Past treatments include spray decongestants. The treatment provided no relief.  Rash This is a new problem. The current episode started more than 1 month ago. The problem is unchanged. Pain location: all over body. The rash is characterized by redness and itchiness. She was exposed to nothing. Associated symptoms include congestion and coughing. Past treatments include nothing. The treatment provided no relief.  Patient has no other concerns at this time.   Using decong spray apparently for months.  On claritin, took zyrtec early, didn';t help that much.  alsa plus   Sig pressure in her sinuses.  Patient also notes food clogging in her esophagus intermittently. Has had her esophagus stretched twice in the past. Was placed on proton pump inhibitor but now only using intermittently. No obvious reflux symptomatology. Next. Patient states she's been coughing for a year. This worries her tremendously. Patient does not smoke. He does have secondary smoke exposure  Review of Systems  HENT: Positive for congestion.   Respiratory: Positive for cough.   Skin: Positive for rash.   No chest pain no back pain ROS otherwise negative    Objective:   Physical Exam  Alert no acute distress H&T moderate his congestion. Frontal tenderness. Trace normal neck supple. Lungs currently rare wheeze heart regular in rhythm. Abdomen benign skin nummular eczema-like eruption      Assessment & Plan:  Impression 1 chronic cough discussed likely reactive airways. Somewhat worsened during allergy season #2 allergic rhinitis poor control  discussed #3 rhinitis medicamentosa. #4 solid food dysphagia plan as per orders and inhaler steroids. Chest x-ray. GI consult. Antibiotics. WSL

## 2014-04-01 NOTE — Telephone Encounter (Signed)
Order already placed in for this when pts note dict

## 2014-04-01 NOTE — Telephone Encounter (Signed)
Patient had an appointment today and spoke with Dr. Richardson Landry about getting her setup with the doctor that she seen previously to have her esophagus stretched again. She just wanted to remind you to set this up, because nothing was mentioned about it on her AVS.

## 2014-04-06 ENCOUNTER — Ambulatory Visit: Payer: BC Managed Care – PPO | Admitting: Family Medicine

## 2014-04-08 ENCOUNTER — Encounter: Payer: Self-pay | Admitting: Nurse Practitioner

## 2014-04-08 ENCOUNTER — Ambulatory Visit (INDEPENDENT_AMBULATORY_CARE_PROVIDER_SITE_OTHER): Payer: BC Managed Care – PPO | Admitting: Nurse Practitioner

## 2014-04-08 VITALS — BP 112/88 | Ht 67.0 in | Wt 229.0 lb

## 2014-04-08 DIAGNOSIS — R5383 Other fatigue: Secondary | ICD-10-CM

## 2014-04-08 DIAGNOSIS — R5381 Other malaise: Secondary | ICD-10-CM

## 2014-04-08 DIAGNOSIS — F419 Anxiety disorder, unspecified: Secondary | ICD-10-CM | POA: Insufficient documentation

## 2014-04-08 DIAGNOSIS — F32A Depression, unspecified: Secondary | ICD-10-CM | POA: Insufficient documentation

## 2014-04-08 DIAGNOSIS — F341 Dysthymic disorder: Secondary | ICD-10-CM

## 2014-04-08 DIAGNOSIS — Z13220 Encounter for screening for lipoid disorders: Secondary | ICD-10-CM

## 2014-04-08 DIAGNOSIS — F329 Major depressive disorder, single episode, unspecified: Secondary | ICD-10-CM

## 2014-04-08 MED ORDER — ESCITALOPRAM OXALATE 10 MG PO TABS: 10.0000 mg | ORAL_TABLET | Freq: Every day | ORAL | Status: DC

## 2014-04-08 NOTE — Patient Instructions (Addendum)
My fitness pal Ventral sleeve gastrectomy [facility]

## 2014-04-09 ENCOUNTER — Encounter: Payer: Self-pay | Admitting: Nurse Practitioner

## 2014-04-09 NOTE — Progress Notes (Signed)
Subjective:  Presents for routine follow up. Has been off Lexapro for 2 weeks. Increased anxiety. Increased fatigue. Has had negative testing for sleep apnea in the past. Limited exercise. Not doing well with her diet.  Objective:   BP 112/88  Ht 5\' 7"  (1.702 m)  Wt 229 lb (103.874 kg)  BMI 35.86 kg/m2  LMP 07/06/2004 NAD. Alert, oriented. Lungs clear. Heart RRR. Central obesity.  Assessment:  Problem List Items Addressed This Visit     Other   Anxiety and depression - Primary    Other Visit Diagnoses   Fatigue        Relevant Orders       Basic metabolic panel       CBC with Differential       Hepatic function panel       TSH       Vit D  25 hydroxy (rtn osteoporosis monitoring)    Need for lipid screening        Relevant Orders       Lipid panel      Plan:  Meds ordered this encounter  Medications  . escitalopram (LEXAPRO) 10 MG tablet    Sig: Take 1 tablet (10 mg total) by mouth daily.    Dispense:  30 tablet    Refill:  5    Order Specific Question:  Supervising Provider    Answer:  [name] [[id number]]   Schedule physical for next visit. Encouraged regular activity, healthy diet and weight loss. Discussed weight loss surgery.

## 2014-04-14 ENCOUNTER — Encounter (INDEPENDENT_AMBULATORY_CARE_PROVIDER_SITE_OTHER): Payer: Self-pay | Admitting: *Deleted

## 2014-04-16 ENCOUNTER — Telehealth: Payer: Self-pay | Admitting: Family Medicine

## 2014-04-16 NOTE — Telephone Encounter (Signed)
Pt still with cough but it's some better, taking her reflux medicine, having chest pain all the way across, thought it may be gas, ongoing for 3 days, really can't afford to come in or go to ER, please advise

## 2014-04-17 NOTE — Telephone Encounter (Signed)
Spoke with patient. Encouraged her to go to ER for the chest pain and that it should not wait until later this evening or any longer. Pt verbalized understanding.

## 2014-04-17 NOTE — Telephone Encounter (Signed)
According to my notes, I did not see patient for these symptoms. I recommend that she be evaluated at our office or local urgent care. Also make sure she is taking her reflux medicine.

## 2014-04-20 ENCOUNTER — Telehealth: Payer: Self-pay | Admitting: Family Medicine

## 2014-04-20 NOTE — Telephone Encounter (Signed)
Will send message when her results come in. Please make sure she has signed up for my chart. Thanks.

## 2014-04-20 NOTE — Telephone Encounter (Signed)
Patient had to cancel her physical this Friday due to starting a new job. She would like you to send email through my chart to tell her the blood work results.

## 2014-04-22 ENCOUNTER — Encounter: Payer: Self-pay | Admitting: Nurse Practitioner

## 2014-04-22 LAB — LIPID PANEL
Cholesterol: 228 mg/dL — ABNORMAL HIGH (ref 0–200)
HDL: 55 mg/dL (ref >39)
LDL Cholesterol: 148 mg/dL — ABNORMAL HIGH (ref 0–99)
Total CHOL/HDL Ratio: 4.1 Ratio
Triglycerides: 125 mg/dL (ref <150)
VLDL: 25 mg/dL (ref 0–40)

## 2014-04-22 LAB — CBC WITH DIFFERENTIAL/PLATELET
Basophils Absolute: 0.1 10*3/uL (ref 0.0–0.1)
Basophils Relative: 1 % (ref 0–1)
Eosinophils Absolute: 0.7 10*3/uL (ref 0.0–0.7)
Eosinophils Relative: 8 % — ABNORMAL HIGH (ref 0–5)
HCT: 39.2 % (ref 36.0–46.0)
Hemoglobin: 12.9 g/dL (ref 12.0–15.0)
Lymphocytes Relative: 26 % (ref 12–46)
Lymphs Abs: 2.3 10*3/uL (ref 0.7–4.0)
MCH: 27.2 pg (ref 26.0–34.0)
MCHC: 32.9 g/dL (ref 30.0–36.0)
MCV: 82.5 fL (ref 78.0–100.0)
Monocytes Absolute: 0.4 10*3/uL (ref 0.1–1.0)
Monocytes Relative: 5 % (ref 3–12)
Neutro Abs: 5.2 10*3/uL (ref 1.7–7.7)
Neutrophils Relative %: 60 % (ref 43–77)
Platelets: 314 10*3/uL (ref 150–400)
RBC: 4.75 MIL/uL (ref 3.87–5.11)
RDW: 15.7 % — ABNORMAL HIGH (ref 11.5–15.5)
WBC: 8.7 10*3/uL (ref 4.0–10.5)

## 2014-04-22 LAB — TSH: TSH: 4.067 u[IU]/mL (ref 0.350–4.500)

## 2014-04-22 LAB — BASIC METABOLIC PANEL
BUN: 8 mg/dL (ref 6–23)
CO2: 33 mEq/L — ABNORMAL HIGH (ref 19–32)
Calcium: 9.2 mg/dL (ref 8.4–10.5)
Chloride: 104 mEq/L (ref 96–112)
Creat: 0.8 mg/dL (ref 0.50–1.10)
Glucose, Bld: 87 mg/dL (ref 70–99)
Potassium: 4.2 mEq/L (ref 3.5–5.3)
Sodium: 141 mEq/L (ref 135–145)

## 2014-04-22 LAB — HEPATIC FUNCTION PANEL
ALT: 20 U/L (ref 0–35)
AST: 20 U/L (ref 0–37)
Albumin: 3.8 g/dL (ref 3.5–5.2)
Alkaline Phosphatase: 81 U/L (ref 39–117)
Bilirubin, Direct: 0.1 mg/dL (ref 0.0–0.3)
Indirect Bilirubin: 0.3 mg/dL (ref 0.2–1.2)
Total Bilirubin: 0.4 mg/dL (ref 0.2–1.2)
Total Protein: 6.7 g/dL (ref 6.0–8.3)

## 2014-04-22 LAB — VITAMIN D 25 HYDROXY (VIT D DEFICIENCY, FRACTURES): Vit D, 25-Hydroxy: 37 ng/mL (ref 30–89)

## 2014-04-24 ENCOUNTER — Encounter: Payer: BC Managed Care – PPO | Admitting: Nurse Practitioner

## 2014-04-30 ENCOUNTER — Telehealth: Payer: Self-pay | Admitting: Family Medicine

## 2014-04-30 MED ORDER — BENZONATATE 100 MG PO CAPS: 100.0000 mg | ORAL_CAPSULE | Freq: Three times a day (TID) | ORAL | Status: DC | PRN

## 2014-04-30 MED ORDER — CLARITHROMYCIN 500 MG PO TABS
500.0000 mg | ORAL_TABLET | Freq: Two times a day (BID) | ORAL | Status: DC
Start: 2014-04-30 — End: 2014-05-08

## 2014-04-30 NOTE — Telephone Encounter (Signed)
biaxin 500 bid ten d, tess perle 100 numb thirty one q 6 prn cough

## 2014-04-30 NOTE — Telephone Encounter (Signed)
Pt states she is still not better from her visit on 5/4 Still has a cough and congestion. Wants to know if you  Will call her in some more cough meds and possibly another antibiotic to kick this out.   [facility]

## 2014-04-30 NOTE — Telephone Encounter (Signed)
Med sent electronically to pharmacy. Patient notified.

## 2014-05-08 ENCOUNTER — Encounter: Payer: Self-pay | Admitting: Family Medicine

## 2014-05-08 ENCOUNTER — Ambulatory Visit (INDEPENDENT_AMBULATORY_CARE_PROVIDER_SITE_OTHER): Payer: BC Managed Care – PPO | Admitting: Family Medicine

## 2014-05-08 VITALS — BP 122/82 | Temp 98.9°F | Ht 67.0 in | Wt 227.2 lb

## 2014-05-08 DIAGNOSIS — R062 Wheezing: Secondary | ICD-10-CM

## 2014-05-08 DIAGNOSIS — J45901 Unspecified asthma with (acute) exacerbation: Secondary | ICD-10-CM

## 2014-05-08 DIAGNOSIS — J441 Chronic obstructive pulmonary disease with (acute) exacerbation: Secondary | ICD-10-CM | POA: Insufficient documentation

## 2014-05-08 MED ORDER — ALBUTEROL SULFATE (2.5 MG/3ML) 0.083% IN NEBU
2.5000 mg | INHALATION_SOLUTION | Freq: Once | RESPIRATORY_TRACT | Status: AC
  Administered 2014-05-08: 2.5 mg via RESPIRATORY_TRACT

## 2014-05-08 MED ORDER — ALBUTEROL SULFATE (2.5 MG/3ML) 0.083% IN NEBU: 2.5000 mg | INHALATION_SOLUTION | Freq: Four times a day (QID) | RESPIRATORY_TRACT | Status: DC | PRN

## 2014-05-08 MED ORDER — FLUTICASONE PROPIONATE HFA 110 MCG/ACT IN AERO: 2.0000 | INHALATION_SPRAY | Freq: Two times a day (BID) | RESPIRATORY_TRACT | Status: DC

## 2014-05-08 MED ORDER — PREDNISONE 20 MG PO TABS: ORAL_TABLET | ORAL | Status: DC

## 2014-05-08 MED ORDER — LEVOFLOXACIN 500 MG PO TABS: 500.0000 mg | ORAL_TABLET | Freq: Every day | ORAL | Status: AC

## 2014-05-08 NOTE — Patient Instructions (Signed)
Asthma, Adult Asthma is a recurring condition in which the airways tighten and narrow. Asthma can make it difficult to breathe. It can cause coughing, wheezing, and shortness of breath. Asthma episodes (also called asthma attacks) range from minor to life-threatening. Asthma cannot be cured, but medicines and lifestyle changes can help control it. CAUSES Asthma is believed to be caused by inherited (genetic) and environmental factors, but its exact cause is unknown. Asthma may be triggered by allergens, lung infections, or irritants in the air. Asthma triggers are different for each person. Common triggers include:   Animal dander.  Dust mites.  Cockroaches.  Pollen from trees or grass.  Mold.  Smoke.  Air pollutants such as dust, household cleaners, hair sprays, aerosol sprays, paint fumes, strong chemicals, or strong odors.  Cold air, weather changes, and winds (which increase molds and pollens in the air).  Strong emotional expressions such as crying or laughing hard.  Stress.  Certain medicines (such as aspirin) or types of drugs (such as beta-blockers).  Sulfites in foods and drinks. Foods and drinks that may contain sulfites include dried fruit, potato chips, and sparkling grape juice.  Infections or inflammatory conditions such as the flu, a cold, or an inflammation of the nasal membranes (rhinitis).  Gastroesophageal reflux disease (GERD).  Exercise or strenuous activity. SYMPTOMS Symptoms may occur immediately after asthma is triggered or many hours later. Symptoms include:  Wheezing.  Excessive nighttime or early morning coughing.  Frequent or severe coughing with a common cold.  Chest tightness.  Shortness of breath. DIAGNOSIS  The diagnosis of asthma is made by a review of your medical history and a physical exam. Tests may also be performed. These may include:  Lung function studies. These tests show how much air you breath in and out.  Allergy  tests.  Imaging tests such as X-rays. TREATMENT  Asthma cannot be cured, but it can usually be controlled. Treatment involves identifying and avoiding your asthma triggers. It also involves medicines. There are 2 classes of medicine used for asthma treatment:   Controller medicines. These prevent asthma symptoms from occurring. They are usually taken every day.  Reliever or rescue medicines. These quickly relieve asthma symptoms. They are used as needed and provide short-term relief. Your health care provider will help you create an asthma action plan. An asthma action plan is a written plan for managing and treating your asthma attacks. It includes a list of your asthma triggers and how they may be avoided. It also includes information on when medicines should be taken and when their dosage should be changed. An action plan may also involve the use of a device called a peak flow meter. A peak flow meter measures how well the lungs are working. It helps you monitor your condition. HOME CARE INSTRUCTIONS   Take medicine as directed by your health care provider. Speak with your health care provider if you have questions about how or when to take the medicines.  Use a peak flow meter as directed by your health care provider. Record and keep track of readings.  Understand and use the action plan to help minimize or stop an asthma attack without needing to seek medical care.  Control your home environment in the following ways to help prevent asthma attacks:  Do not smoke. Avoid being exposed to secondhand smoke.  Change your heating and air conditioning filter regularly.  Limit your use of fireplaces and wood stoves.  Get rid of pests (such as roaches and   mice) and their droppings.  Throw away plants if you see mold on them.  Clean your floors and dust regularly. Use unscented cleaning products.  Try to have someone else vacuum for you regularly. Stay out of rooms while they are being  vacuumed and for a short while afterward. If you vacuum, use a dust mask from a hardware store, a double-layered or microfilter vacuum cleaner bag, or a vacuum cleaner with a HEPA filter.  Replace carpet with wood, tile, or vinyl flooring. Carpet can trap dander and dust.  Use allergy-proof pillows, mattress covers, and box spring covers.  Wash bed sheets and blankets every week in hot water and dry them in a dryer.  Use blankets that are made of polyester or cotton.  Clean bathrooms and kitchens with bleach. If possible, have someone repaint the walls in these rooms with mold-resistant paint. Keep out of the rooms that are being cleaned and painted.  Wash hands frequently. SEEK MEDICAL CARE IF:   You have wheezing, shortness of breath, or a cough even if taking medicine to prevent attacks.  The colored mucus you cough up (sputum) is thicker than usual.  Your sputum changes from clear or white to yellow, green, gray, or bloody.  You have any problems that may be related to the medicines you are taking (such as a rash, itching, swelling, or trouble breathing).  You are using a reliever medicine more than 2 3 times per week.  Your peak flow is still at 50 79% of you personal best after following your action plan for 1 hour. SEEK IMMEDIATE MEDICAL CARE IF:   You seem to be getting worse and are unresponsive to treatment during an asthma attack.  You are short of breath even at rest.  You get short of breath when doing very little physical activity.  You have difficulty eating, drinking, or talking due to asthma symptoms.  You develop chest pain.  You develop a fast heartbeat.  You have a bluish color to your lips or fingernails.  You are lightheaded, dizzy, or faint.  Your peak flow is less than 50% of your personal best.  You have a fever or persistent symptoms for more than 2 3 days.  You have a fever and symptoms suddenly get worse. MAKE SURE YOU:   Understand these  instructions.  Will watch your condition.  Will get help right away if you are not doing well or get worse. Document Released: 11/20/2005 Document Revised: 07/23/2013 Document Reviewed: 06/19/2013 Exit[employer] Patient Information 2014 [employer].  

## 2014-05-08 NOTE — Progress Notes (Signed)
   Subjective:    Patient ID: Brenda [name], female    DOB: [birth year], 43 y.o.   MRN: [id number]  Cough This is a chronic problem. The current episode started more than 1 year ago. Associated symptoms include nasal congestion and wheezing. Treatments tried: omnicef, biaxin ,tessalon perles.   Patient would like rx for nebulizer machine.  Spring allergies have improved.  The last 2 days have been difficult. Consider going to the emergency room last night. Severe wheezing. On further history did some wheezing very rarely once every 5 or 7 years with chest cold. Of note chronic cough for the past year.  All this illness started 6 weeks ago with a flulike prodrome. Now wheezing cough. Worse in recent days productive in nature. Gets headaches when coughing [name]. Next  Does not smoke some smoke exposure  Frequent "bronchitis" as a child  Review of Systems  Respiratory: Positive for cough and wheezing.    No vomiting no diarrhea no abdominal pain ROS otherwise negative    Objective:   Physical Exam Alert moderate malaise. Vital stable HET moderate his congestion lungs bilateral wheezes very significant. Significant improvement post to nebulizer treatments. Heart slight tachycardic       Assessment & Plan:  Impression asthma discussed. Time to declare this is a diagnosis. Likely parainfluenza prodrome at the start of this flare. Now onto persistent bronchitis. Plan prednisone taper. Levaquin daily. Time to initiate Flovent 110 mics per puff 2 puffs twice a day. Nebulizer prescribed. Warning signs discussed. Educational information given. Recheck in one month.

## 2014-05-14 ENCOUNTER — Encounter (INDEPENDENT_AMBULATORY_CARE_PROVIDER_SITE_OTHER): Payer: Self-pay | Admitting: Internal Medicine

## 2014-05-14 ENCOUNTER — Ambulatory Visit (INDEPENDENT_AMBULATORY_CARE_PROVIDER_SITE_OTHER): Payer: BC Managed Care – PPO | Admitting: Internal Medicine

## 2014-05-14 VITALS — BP 132/58 | HR 92 | Temp 97.8°F | Ht 67.0 in | Wt 232.5 lb

## 2014-05-14 DIAGNOSIS — R059 Cough, unspecified: Secondary | ICD-10-CM

## 2014-05-14 DIAGNOSIS — K219 Gastro-esophageal reflux disease without esophagitis: Secondary | ICD-10-CM | POA: Insufficient documentation

## 2014-05-14 DIAGNOSIS — R05 Cough: Secondary | ICD-10-CM

## 2014-05-14 MED ORDER — PANTOPRAZOLE SODIUM 40 MG PO TBEC: 40.0000 mg | DELAYED_RELEASE_TABLET | Freq: Every day | ORAL | Status: DC

## 2014-05-14 NOTE — Progress Notes (Signed)
Subjective:     Patient ID: Brenda [name], female   DOB: [birth year], 43 y.o.   MRN: [id number]  HPIReferred to our office by Dr. Sallee Lange for cough. She has had this cough for a year. She has been on numerous antibiotics in the past 3 months for her asthma/bronchitis. Presently taking Levaquin and Prednisone which she started on 05/08/2014.  She has wheezes every day for the past 6 months.  Chest xray revealed: FINDINGS:  Mediastinum and hilar structures normal. Minimal perihilar  interstitial prominence noted. Mild pneumonitis cannot be excluded.  No focal alveolar infiltrate. No pleural effusion or pneumothorax.  Heart size normal. Degenerative changes thoracic spine.  IMPRESSION:  Cannot exclude mild changes of perihilar pneumonitis. Mild chronic  interstitial lung disease could also present in this fashion.  Appetite is good. No weight loss. She tells me she has acid reflux, which she takes Nexium for. She says it is not controlled all the time. Foods are not lodging in her esophagus. No abdominal pain.She usually has a BM once a week. She takes a laxative after the 3rd day. No melena or bright red rectal bleeding.  She had an EGD/ED years ago by Dr. Laural Golden for dysphagia.( I could not find the report).   06/22/2004 DG Esophagus: choking sensation IMPRESSION  Somewhat prominent esophageal ampulla without evidence of definite hiatal hernia or reflux. Slight prominence cricopharyngeus muscle, but no Zenker's diverticulum.   Review of Systems Past Medical History  Diagnosis Date  . Asthma     Past Surgical History  Procedure Laterality Date  . Back surgery      3-4 yrs ago.  . Bladder surgery      incontinence  . Egd/ed      5 yr ago by Dr. 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 Two children in good health  No Known Allergies  Current Outpatient Prescriptions on File Prior to Visit  Medication Sig Dispense Refill  . albuterol (PROVENTIL HFA;VENTOLIN HFA)  108 (90 BASE) MCG/ACT inhaler Inhale 2 puffs into the lungs every 6 (six) hours as needed for wheezing or shortness of breath.  1 Inhaler  2  . albuterol (PROVENTIL) (2.5 MG/3ML) 0.083% nebulizer solution Take 3 mLs (2.5 mg total) by nebulization every 6 (six) hours as needed for wheezing or shortness of breath.  50 vial  5  . escitalopram (LEXAPRO) 10 MG tablet Take 1 tablet (10 mg total) by mouth daily.  30 tablet  5  . fluticasone (FLONASE) 50 MCG/ACT nasal spray Place 2 sprays into both nostrils daily. Prn congestion  16 g  0  . fluticasone (FLOVENT HFA) 110 MCG/ACT inhaler Inhale 2 puffs into the lungs 2 (two) times daily.  1 Inhaler  5  . levofloxacin (LEVAQUIN) 500 MG tablet Take 1 tablet (500 mg total) by mouth daily.  10 tablet  0  . predniSONE (DELTASONE) 20 MG tablet Take 3 tabs qd x 3 days, 2 tabs qd x 3 days then 1 tab qd x 2 days  17 tablet  0  . triamcinolone cream (KENALOG) 0.1 % Apply 1 application topically 2 (two) times daily.  60 g  0  . benzonatate (TESSALON) 100 MG capsule Take 1 capsule (100 mg total) by mouth 3 (three) times daily as needed for cough.  30 capsule  0   No current facility-administered medications on file prior to visit.        Objective:   Physical Exam  Filed Vitals:   05/14/14  1454  BP: 132/58  Pulse: 92  Temp: 97.8 F (36.6 C)  Height: 5\' 7"  (1.702 m)  Weight: 232 lb 8 oz (105.461 kg)  Alert and oriented. Skin warm and dry. Oral mucosa is moist.   . Sclera anicteric, conjunctivae is pink. Thyroid not enlarged. No cervical lymphadenopathy. Lungs clear. Heart regular rate and rhythm.  Abdomen is soft. Bowel sounds are positive. No hepatomegaly. No abdominal masses felt. Sligh epigastric tenderness.  No edema to lower extremities.        Assessment:    GERD; not controlled at this time. I discussed this case with Dr. Laural Golden.  Constipation    Plan:    Protonix 40mg  BID . OV in 2 months and I will call Dr Laural Golden   Miralax 1 scoop a day.

## 2014-05-14 NOTE — Patient Instructions (Signed)
Protonix 40mg  BID. OV in 2 months.

## 2014-07-14 ENCOUNTER — Ambulatory Visit (INDEPENDENT_AMBULATORY_CARE_PROVIDER_SITE_OTHER): Payer: BC Managed Care – PPO | Admitting: Internal Medicine

## 2014-08-21 ENCOUNTER — Ambulatory Visit: Payer: BC Managed Care – PPO | Admitting: Family Medicine

## 2014-08-24 ENCOUNTER — Ambulatory Visit: Payer: BC Managed Care – PPO | Admitting: Family Medicine

## 2014-11-30 ENCOUNTER — Ambulatory Visit (INDEPENDENT_AMBULATORY_CARE_PROVIDER_SITE_OTHER): Payer: 59 | Admitting: Family Medicine

## 2014-11-30 ENCOUNTER — Encounter: Payer: Self-pay | Admitting: Family Medicine

## 2014-11-30 VITALS — BP 130/88 | Temp 98.3°F | Ht 67.0 in | Wt 243.0 lb

## 2014-11-30 DIAGNOSIS — J449 Chronic obstructive pulmonary disease, unspecified: Secondary | ICD-10-CM

## 2014-11-30 DIAGNOSIS — J45901 Unspecified asthma with (acute) exacerbation: Principal | ICD-10-CM

## 2014-11-30 DIAGNOSIS — J310 Chronic rhinitis: Secondary | ICD-10-CM

## 2014-11-30 DIAGNOSIS — J329 Chronic sinusitis, unspecified: Secondary | ICD-10-CM

## 2014-11-30 DIAGNOSIS — J441 Chronic obstructive pulmonary disease with (acute) exacerbation: Secondary | ICD-10-CM

## 2014-11-30 MED ORDER — FLUTICASONE PROPIONATE HFA 220 MCG/ACT IN AERO: 2.0000 | INHALATION_SPRAY | Freq: Two times a day (BID) | RESPIRATORY_TRACT | Status: DC

## 2014-11-30 MED ORDER — PREDNISONE 20 MG PO TABS: ORAL_TABLET | ORAL | Status: DC

## 2014-11-30 MED ORDER — AMOXICILLIN-POT CLAVULANATE 875-125 MG PO TABS: 1.0000 | ORAL_TABLET | Freq: Two times a day (BID) | ORAL | Status: DC

## 2014-11-30 NOTE — Progress Notes (Signed)
   Subjective:    Patient ID: Brenda [name], female    DOB: [birth year], 43 y.o.   MRN: [id number]  Cough This is a new problem. Episode onset: Christmas. The problem has been gradually worsening. The cough is productive of sputum. Associated symptoms include chest pain and nasal congestion. Nothing aggravates the symptoms. She has tried OTC cough suppressant and steroid inhaler for the symptoms. The treatment provided mild relief. Her past medical history is significant for asthma.   Patient did not maintain her steroid inhaler as requested. Did not follow-up as requested.   Sinus headache and cough and cong  Frontal   Wheezing neb q three hrs and feeloing rough at times  Headache frontal  ch sharp in natures, hits and uncomfortabe, has to bend over to ease ooff  Review of Systems  Respiratory: Positive for cough.   Cardiovascular: Positive for chest pain.   no vomiting no diarrhea no rash     Objective:   Physical Exam  Alert hydration good. HEENT moderate his congestion frontal tenderness. Lungs bilateral wheezes no tachypnea heart regular in rhythm.      Assessment & Plan:  Impression sinusitis/bronchitis with exacerbation of asthma. Also complicated by noncompliance. Plan Flovent 220 problems 2 twice a day. Prednisone taper. Augmentin twice a day 14 days. Symptomatic care discussed. WSL

## 2014-12-04 HISTORY — PX: REDUCTION MAMMAPLASTY: SUR839

## 2014-12-17 ENCOUNTER — Telehealth: Payer: Self-pay | Admitting: Family Medicine

## 2014-12-17 MED ORDER — PREDNISONE 20 MG PO TABS: ORAL_TABLET | ORAL | Status: DC

## 2014-12-17 MED ORDER — CLARITHROMYCIN 500 MG PO TABS: 500.0000 mg | ORAL_TABLET | Freq: Two times a day (BID) | ORAL | Status: DC

## 2014-12-17 NOTE — Telephone Encounter (Signed)
biaxin 500 bid ten d, rep pred if still sig wheezing

## 2014-12-17 NOTE — Telephone Encounter (Signed)
Patient was seen on 12/28 but not feeling any better still has real bad cough,congestion wants another round of antibotics and cough syrup called into [facility].

## 2014-12-17 NOTE — Addendum Note (Signed)
Addended by: Jesusita Oka on: 12/17/2014 05:21 PM   Modules accepted: Orders

## 2014-12-17 NOTE — Telephone Encounter (Signed)
Medication sent to pharmacy. Left message on voicemail notifying patient.

## 2015-01-05 ENCOUNTER — Telehealth: Payer: Self-pay | Admitting: *Deleted

## 2015-01-05 ENCOUNTER — Encounter: Payer: Self-pay | Admitting: Family Medicine

## 2015-01-05 ENCOUNTER — Ambulatory Visit (INDEPENDENT_AMBULATORY_CARE_PROVIDER_SITE_OTHER): Payer: 59 | Admitting: Family Medicine

## 2015-01-05 VITALS — BP 128/70 | Temp 98.4°F | Ht 67.0 in | Wt 244.8 lb

## 2015-01-05 DIAGNOSIS — J449 Chronic obstructive pulmonary disease, unspecified: Secondary | ICD-10-CM

## 2015-01-05 DIAGNOSIS — J45901 Unspecified asthma with (acute) exacerbation: Principal | ICD-10-CM

## 2015-01-05 DIAGNOSIS — J441 Chronic obstructive pulmonary disease with (acute) exacerbation: Secondary | ICD-10-CM

## 2015-01-05 MED ORDER — FLUTICASONE PROPIONATE 50 MCG/ACT NA SUSP: 2.0000 | Freq: Every day | NASAL | Status: DC

## 2015-01-05 MED ORDER — PREDNISONE 20 MG PO TABS: ORAL_TABLET | ORAL | Status: DC

## 2015-01-05 MED ORDER — CLARITHROMYCIN 500 MG PO TABS: 500.0000 mg | ORAL_TABLET | Freq: Two times a day (BID) | ORAL | Status: DC

## 2015-01-05 MED ORDER — FLUTICASONE-SALMETEROL 115-21 MCG/ACT IN AERO: 2.0000 | INHALATION_SPRAY | Freq: Two times a day (BID) | RESPIRATORY_TRACT | Status: DC

## 2015-01-05 NOTE — Patient Instructions (Addendum)
We will work on referral. Start the advair in place of the flovent as soon as you get it filled out. Take all the prednisone and take all the antibiotic as prescribed. As part of today's visit a referral has been made. This is a process that is handled by our clinical referral specialists. This process requires that we send your medical information to the specialists for their review before they will issue you an appointment. Unfortunately this does take time and much of this process is under the responsibility of the specialists. Our referral specialist will make certain that your insurance company is notified as well as the physician group that we are referring you to for your problem. Emergent referrals are made as quick as possible. Most standard referrals often take 7-10 days before we hear from the specialists office when they can see you. If you have not heard when your appointment is from [place] or the referral specialists within 7-10 days please call us regarding this referral.

## 2015-01-05 NOTE — Progress Notes (Signed)
   Subjective:    Patient ID: Brenda [name], female    DOB: 09/14/1971, 44 y.o.   MRN: [id number]  Cough This is a chronic problem. The current episode started more than 1 year ago (2 years ago). Associated symptoms include headaches and wheezing. Treatments tried: neb treatment, inhalers, antibiotics, steroids.   Ongoing cough and congestion  Off the med and off the pred  ough now back as basd as it was  Last eve bad cough  Serious coughing spell Of note patient was seen last summer. Started on a steroid inhaler. Encouraged to follow-up in one month. She did not return.  Return in 6 months later with significant cough and wheezing. Was started once again on Flovent. Patient reports not helping much.  We called and further antibiotics a couple weeks ago.  Reemergence of wheezing the last several days.  Notes potential allergy to dogs and wonders if this may be a component.   Review of Systems  Respiratory: Positive for cough and wheezing.   Neurological: Positive for headaches.   no fever no chills somewhat productive cough nonsmoker     Objective:   Physical Exam  Alert mild malaise talkative vital stable HET normal lungs bilateral wheezes heart regular in rhythm non-to Know tachycardiac      Assessment & Plan:  Impression exacerbation of asthma #2 chronic persistent asthma with continued challenges. Plan stop Flovent. Initiate Advair twice a day. Mother round of prednisone and Biaxin. Referral to asthma specialist. Least 25 minutes spent most in discussion. WSL

## 2015-01-05 NOTE — Telephone Encounter (Signed)
Dr. Richardson Landry put in referral for pt. Pt wants to try and get appt on one of her days off. She is off feb 4, 5, 10, and 11th. Pt advised it may take a few weeks to get appt. She gets work schedule tomorrow and will call back and let you know what days she is off.

## 2015-01-06 ENCOUNTER — Encounter: Payer: Self-pay | Admitting: Family Medicine

## 2015-01-06 NOTE — Telephone Encounter (Signed)
Pt scheduled with Dr. Ishmael Holter in [place] on Feb 11, at [place] notified.

## 2015-02-15 ENCOUNTER — Other Ambulatory Visit: Payer: Self-pay | Admitting: *Deleted

## 2015-02-15 ENCOUNTER — Telehealth: Payer: Self-pay | Admitting: Family Medicine

## 2015-02-15 MED ORDER — LEVOFLOXACIN 500 MG PO TABS: 500.0000 mg | ORAL_TABLET | Freq: Every day | ORAL | Status: DC

## 2015-02-15 MED ORDER — PREDNISONE 20 MG PO TABS: ORAL_TABLET | ORAL | Status: DC

## 2015-02-15 NOTE — Telephone Encounter (Signed)
meds sent to pharm. Pt notified. Pt states she has already seen the asthma specialist.

## 2015-02-15 NOTE — Telephone Encounter (Signed)
Adult pred taper, levaquin 500 qd for ten d, have brendale please ck on status of referral this morning

## 2015-02-15 NOTE — Telephone Encounter (Signed)
Seen 01/05/15 for cough and asthma. Referral was put in for asthma specialist. Still having cough. coughin up sputum. Starting to turn green. No fever. She is wheezing. Using albuteral and advair.

## 2015-02-15 NOTE — Telephone Encounter (Signed)
Patient has a bad cough and is requesting that we call in a steroid for her cough.  Can we do this or does she need to come in?   [facility]

## 2015-04-07 ENCOUNTER — Other Ambulatory Visit: Payer: Self-pay

## 2015-04-07 DIAGNOSIS — Z1231 Encounter for screening mammogram for malignant neoplasm of breast: Secondary | ICD-10-CM

## 2015-04-26 ENCOUNTER — Telehealth: Payer: Self-pay | Admitting: Nurse Practitioner

## 2015-04-26 ENCOUNTER — Other Ambulatory Visit: Payer: Self-pay | Admitting: Nurse Practitioner

## 2015-04-26 ENCOUNTER — Encounter: Payer: Self-pay | Admitting: Nurse Practitioner

## 2015-04-26 ENCOUNTER — Ambulatory Visit (INDEPENDENT_AMBULATORY_CARE_PROVIDER_SITE_OTHER): Payer: BLUE CROSS/BLUE SHIELD | Admitting: Nurse Practitioner

## 2015-04-26 VITALS — BP 128/88 | Ht 67.0 in | Wt 243.1 lb

## 2015-04-26 DIAGNOSIS — R102 Pelvic and perineal pain: Secondary | ICD-10-CM | POA: Diagnosis not present

## 2015-04-26 DIAGNOSIS — M549 Dorsalgia, unspecified: Secondary | ICD-10-CM

## 2015-04-26 DIAGNOSIS — Z0000 Encounter for general adult medical examination without abnormal findings: Secondary | ICD-10-CM

## 2015-04-26 DIAGNOSIS — Z01419 Encounter for gynecological examination (general) (routine) without abnormal findings: Secondary | ICD-10-CM

## 2015-04-26 DIAGNOSIS — G8929 Other chronic pain: Secondary | ICD-10-CM

## 2015-04-26 DIAGNOSIS — M546 Pain in thoracic spine: Secondary | ICD-10-CM | POA: Diagnosis not present

## 2015-04-26 MED ORDER — PHENTERMINE HCL 37.5 MG PO TABS
37.5000 mg | ORAL_TABLET | Freq: Every day | ORAL | Status: DC
Start: 2015-04-26 — End: 2015-05-24

## 2015-04-26 MED ORDER — ESCITALOPRAM OXALATE 10 MG PO TABS: 10.0000 mg | ORAL_TABLET | Freq: Every day | ORAL | Status: DC

## 2015-04-26 NOTE — Telephone Encounter (Signed)
Pt would like to be referred to the Dr. That her sister Lynett Fish went to, to have  A breast reduction done. Pt states that she has shoulder and back pain.

## 2015-04-26 NOTE — Patient Instructions (Addendum)
benefiber and Miralax High magnesium foods Severe constipation: fleets enema or magnesium citrate

## 2015-04-27 LAB — BASIC METABOLIC PANEL
BUN/Creatinine Ratio: 12 (ref 9–23)
BUN: 9 mg/dL (ref 6–24)
CO2: 24 mmol/L (ref 18–29)
Calcium: 9.1 mg/dL (ref 8.7–10.2)
Chloride: 101 mmol/L (ref 97–108)
Creatinine, Ser: 0.78 mg/dL (ref 0.57–1.00)
GFR calc Af Amer: 106 mL/min/{1.73_m2} (ref >59)
GFR calc non Af Amer: 92 mL/min/{1.73_m2} (ref >59)
Glucose: 92 mg/dL (ref 65–99)
Potassium: 4.5 mmol/L (ref 3.5–5.2)
Sodium: 140 mmol/L (ref 134–144)

## 2015-04-27 LAB — HEPATIC FUNCTION PANEL
ALT: 35 IU/L — ABNORMAL HIGH (ref 0–32)
AST: 25 IU/L (ref 0–40)
Albumin: 4 g/dL (ref 3.5–5.5)
Alkaline Phosphatase: 91 IU/L (ref 39–117)
Bilirubin Total: 0.4 mg/dL (ref 0.0–1.2)
Bilirubin, Direct: 0.11 mg/dL (ref 0.00–0.40)
Total Protein: 6.5 g/dL (ref 6.0–8.5)

## 2015-04-27 LAB — LIPID PANEL
Chol/HDL Ratio: 3.9 ratio units (ref 0.0–4.4)
Cholesterol, Total: 190 mg/dL (ref 100–199)
HDL: 49 mg/dL (ref >39)
LDL Calculated: 114 mg/dL — ABNORMAL HIGH (ref 0–99)
Triglycerides: 137 mg/dL (ref 0–149)
VLDL Cholesterol Cal: 27 mg/dL (ref 5–40)

## 2015-04-27 LAB — TSH: TSH: 2.36 u[IU]/mL (ref 0.450–4.500)

## 2015-04-28 ENCOUNTER — Other Ambulatory Visit (HOSPITAL_COMMUNITY): Payer: Self-pay

## 2015-04-28 ENCOUNTER — Ambulatory Visit (HOSPITAL_COMMUNITY)
Admission: RE | Admit: 2015-04-28 | Discharge: 2015-04-28 | Disposition: A | Discharge status: Home / Self Care | Payer: BLUE CROSS/BLUE SHIELD | Source: Ambulatory Visit

## 2015-04-28 ENCOUNTER — Ambulatory Visit: Payer: Self-pay

## 2015-04-28 ENCOUNTER — Ambulatory Visit (HOSPITAL_COMMUNITY)
Admission: RE | Admit: 2015-04-28 | Discharge: 2015-04-28 | Disposition: A | Discharge status: Home / Self Care | Payer: BLUE CROSS/BLUE SHIELD | Source: Ambulatory Visit | Attending: Nurse Practitioner | Admitting: Nurse Practitioner

## 2015-04-28 DIAGNOSIS — Z1231 Encounter for screening mammogram for malignant neoplasm of breast: Secondary | ICD-10-CM | POA: Insufficient documentation

## 2015-04-28 DIAGNOSIS — R102 Pelvic and perineal pain: Secondary | ICD-10-CM

## 2015-04-30 ENCOUNTER — Encounter: Payer: Self-pay | Admitting: Nurse Practitioner

## 2015-04-30 DIAGNOSIS — G8929 Other chronic pain: Secondary | ICD-10-CM | POA: Insufficient documentation

## 2015-04-30 DIAGNOSIS — M549 Dorsalgia, unspecified: Secondary | ICD-10-CM

## 2015-04-30 NOTE — Progress Notes (Signed)
Subjective:    Patient ID: Brenda [name], female    DOB: 1971-09-06, 44 y.o.   MRN: [id number]  HPI presents for her wellness exam. Married, same sexual partner. Has had a hysterectomy, still has her ovaries. Occasional left area pelvic discomfort especially with standing. No back pain. Some constipation at times, has a BM every 3-4 days. Rare use of laxative or enema. Is scheduled for her mammogram in 2 days. Would like to restart phentermine for weight loss. Working out at least 3 times per week. Doing fairly well with her diet. Regular eye exams. Needs dental exam. Also has chronic upper back and neck pain which she attributes to the size of her breasts. Is interested in a consultation for breast reduction.    Review of Systems  Constitutional: Negative for fever, activity change, appetite change and fatigue.  HENT: Negative for dental problem, ear pain, sinus pressure and sore throat.   Respiratory: Negative for cough, chest tightness, shortness of breath and wheezing.   Cardiovascular: Negative for chest pain.  Gastrointestinal: Positive for abdominal pain and constipation. Negative for nausea, vomiting, diarrhea, [name] in stool and abdominal distention.  Genitourinary: Positive for pelvic pain. Negative for dysuria, urgency, frequency, vaginal discharge, enuresis, difficulty urinating and genital sores.  Musculoskeletal: Positive for back pain and neck pain.       Objective:   Physical Exam  Constitutional: She is oriented to person, place, and time. She appears well-developed. No distress.  HENT:  Right Ear: External ear normal.  Left Ear: External ear normal.  Mouth/Throat: Oropharynx is clear and moist.  Neck: Normal range of motion. Neck supple. No tracheal deviation present. No thyromegaly present.  Cardiovascular: Normal rate, regular rhythm and normal heart sounds.  Exam reveals no gallop.   No murmur heard. Pulmonary/Chest: Effort normal and breath sounds normal.    Abdominal: Soft. She exhibits no distension and no mass. There is tenderness. There is no rebound and no guarding.  Minimal lower mid abd tenderness.   Genitourinary: Vagina normal. No vaginal discharge found.  External GU no rashes or lesions. Vagina no discharge, slightly pale. Bimanual exam no tenderness or obvious masses.  Musculoskeletal: She exhibits no edema.  Lymphadenopathy:    She has no cervical adenopathy.  Neurological: She is alert and oriented to person, place, and time.  Skin: Skin is warm and dry. No rash noted.  Psychiatric: She has a normal mood and affect. Her behavior is normal.  Vitals reviewed.  Breast exam: Areas of dense tissue, no obvious masses. Axilla no adenopathy.       Assessment & Plan:   Problem List Items Addressed This Visit      Other   Chronic upper back pain   Relevant Orders   Ambulatory referral to General Surgery   Morbid obesity   Relevant Medications   phentermine (ADIPEX-P) 37.5 MG tablet   Other Relevant Orders   TSH (Completed)    Other Visit Diagnoses    Well woman exam    -  Primary    Relevant Orders    Lipid panel (Completed)    Hepatic function panel (Completed)    Basic metabolic panel (Completed)    Pelvic pain in female        Relevant Orders    US Pelvis Complete (Completed)      Meds ordered this encounter  Medications  . phentermine (ADIPEX-P) 37.5 MG tablet    Sig: Take 1 tablet (37.5 mg total) by mouth daily before  breakfast.    Dispense:  30 tablet    Refill:  0    Order Specific Question:  Supervising Provider    Answer:  [name] [[id number]]  . escitalopram (LEXAPRO) 10 MG tablet    Sig: Take 1 tablet (10 mg total) by mouth daily.    Dispense:  30 tablet    Refill:  5   Trial of phentermine. Reviewed potential adverse effects. DC med and call if any problems. Encouraged healthy diet regular activity and continued weight loss efforts. Also recommend daily vitamin D and calcium supplementation.  Discussed measures to help with constipation. Note no family history of colon cancer. Return in about 1 month (around 05/27/2015) for recheck.

## 2015-05-24 ENCOUNTER — Encounter: Payer: Self-pay | Admitting: Nurse Practitioner

## 2015-05-24 ENCOUNTER — Ambulatory Visit (INDEPENDENT_AMBULATORY_CARE_PROVIDER_SITE_OTHER): Payer: BLUE CROSS/BLUE SHIELD | Admitting: Nurse Practitioner

## 2015-05-24 VITALS — BP 126/88 | Ht 67.0 in | Wt 227.4 lb

## 2015-05-24 DIAGNOSIS — K219 Gastro-esophageal reflux disease without esophagitis: Secondary | ICD-10-CM

## 2015-05-24 MED ORDER — PHENTERMINE HCL 37.5 MG PO TABS: ORAL_TABLET | ORAL | Status: DC

## 2015-05-24 NOTE — Progress Notes (Signed)
Subjective:  Presents for recheck. Doing great with her weight loss. Has started [employer] online. Working out at least 3 days per week. Denies any side effects from the phentermine. Reflux remains stable.  Objective:   BP 126/88 mmHg  Ht 5\' 7"  (1.702 m)  Wt 227 lb 6 oz (103.137 kg)  BMI 35.60 kg/m2  LMP 07/06/2004 NAD. Alert, oriented. Lungs clear. Heart regular rate rhythm.  Assessment:  Problem List Items Addressed This Visit      Digestive   GERD (gastroesophageal reflux disease) - Primary     Other   Morbid obesity   Relevant Medications   phentermine (ADIPEX-P) 37.5 MG tablet     Plan:  Meds ordered this encounter  Medications  . phentermine (ADIPEX-P) 37.5 MG tablet    Sig: One each morning on an empty stomach    Dispense:  30 tablet    Refill:  2    Order Specific Question:  Supervising Provider    Answer:  [name] [[id number]]   Continue weight loss efforts. Recheck in 3 months if she wishes to continue phentermine.

## 2015-06-22 ENCOUNTER — Telehealth: Payer: Self-pay | Admitting: Nurse Practitioner

## 2015-06-22 NOTE — Telephone Encounter (Signed)
Pt states she has a cough again, an don't want it to get as  Bad as it did last time. She has five Levosloxacin 500 mg tabs 1 daily   Wants to know if you can call in a full script for this med to clear it  Up before it starts  [facility]

## 2015-06-23 NOTE — Telephone Encounter (Signed)
Please get more information. Her last flare up was in February according to chart. Fever? Wheezing? Using inhaler? Mucus? Color? Thanks.

## 2015-06-23 NOTE — Telephone Encounter (Signed)
TCNA 

## 2015-06-24 NOTE — Telephone Encounter (Signed)
Spoke with patient and patient stated the following: No fever, some wheezing, no mucus head stopped up and mucus is draining but non productive cough, some use of inhaler . Patient states that she has Levofloxacin 500 MG that she had left over. Patient states that she had 5 tablets left and started taking 2 days ago. Patient wants to know if she should continue on the antibiotic

## 2015-06-25 ENCOUNTER — Other Ambulatory Visit: Payer: Self-pay | Admitting: Nurse Practitioner

## 2015-06-25 MED ORDER — LEVOFLOXACIN 500 MG PO TABS: 500.0000 mg | ORAL_TABLET | Freq: Every day | ORAL | Status: DC

## 2015-06-25 NOTE — Telephone Encounter (Signed)
Left message with husband that rx had been sent to pharm.  Unable to call pt on number left and pt was not at work when i called.

## 2015-06-25 NOTE — Telephone Encounter (Signed)
Now that she has started Levaquin, recommend at least 5 more days after this. Will send in Rx.

## 2015-06-30 ENCOUNTER — Telehealth: Payer: Self-pay | Admitting: Nurse Practitioner

## 2015-06-30 NOTE — Telephone Encounter (Signed)
Pt is needing a letter of medical necessity and weight loss verification sent to Dr. Georgia Lopes for her breast reduction to be approved. Pt needs to know if she needs to come in for a weight check for this as well.

## 2015-07-01 ENCOUNTER — Telehealth: Payer: Self-pay | Admitting: *Deleted

## 2015-07-01 NOTE — Telephone Encounter (Signed)
Pt walked in today and wanted to get her weight. She states Dr. Towanda Malkin needed an up to date weight on her. Her weight today was 209.6 lbs

## 2015-07-09 NOTE — Telephone Encounter (Signed)
Pt called by stating that she is needing the letter of necessity sent to Dr. Towanda Malkin for he breast reduction surgery. Pt states it is going on three weeks and she still hasn't heard anything. Please advise

## 2015-07-12 ENCOUNTER — Encounter: Payer: Self-pay | Admitting: Nurse Practitioner

## 2015-07-12 NOTE — Telephone Encounter (Signed)
Actually it is 11 days as of today since I got initial request. Letter at nurse's desk. Thanks.

## 2015-07-12 NOTE — Telephone Encounter (Signed)
Notified patient that letter is ready for pickup. Patient requests that we fax it to Dr. Neita Goodnight office. Letter was faxed to [phone].

## 2015-07-14 ENCOUNTER — Telehealth: Payer: Self-pay | Admitting: *Deleted

## 2015-07-14 ENCOUNTER — Other Ambulatory Visit: Payer: Self-pay | Admitting: *Deleted

## 2015-07-14 DIAGNOSIS — N83201 Unspecified ovarian cyst, right side: Secondary | ICD-10-CM

## 2015-07-14 NOTE — Telephone Encounter (Signed)
Pt in [place] file for repeat US transvag non ob. Scheduled sept 2nd register 1:15. Pt notified of appt.

## 2015-07-15 ENCOUNTER — Other Ambulatory Visit: Payer: Self-pay | Admitting: *Deleted

## 2015-07-15 DIAGNOSIS — N83201 Unspecified ovarian cyst, right side: Secondary | ICD-10-CM

## 2015-07-26 ENCOUNTER — Telehealth: Payer: Self-pay | Admitting: Family Medicine

## 2015-07-26 MED ORDER — PREDNISONE 20 MG PO TABS: ORAL_TABLET | ORAL | Status: DC

## 2015-07-26 NOTE — Telephone Encounter (Signed)
Pt is requesting a prescription of prednisone for her persistent cough.   [facility] [place]

## 2015-07-26 NOTE — Addendum Note (Signed)
Addended by: Launa Grill on: 07/26/2015 01:45 PM   Modules accepted: Orders

## 2015-07-26 NOTE — Telephone Encounter (Signed)
[facility] 07/26/15 medication already sent in.

## 2015-07-26 NOTE — Telephone Encounter (Signed)
Ok times one, will need ov for any further,

## 2015-07-27 NOTE — Telephone Encounter (Signed)
Spoke with patient and informed her that medication was sent into pharmacy. Patient verbalized understanding.  

## 2015-07-27 NOTE — Telephone Encounter (Signed)
[place] 07/27/15

## 2015-07-29 ENCOUNTER — Other Ambulatory Visit: Payer: Self-pay | Admitting: Nurse Practitioner

## 2015-07-29 DIAGNOSIS — N83201 Unspecified ovarian cyst, right side: Secondary | ICD-10-CM

## 2015-08-06 ENCOUNTER — Ambulatory Visit (HOSPITAL_COMMUNITY)
Admission: RE | Admit: 2015-08-06 | Discharge: 2015-08-06 | Disposition: A | Discharge status: Home / Self Care | Payer: BLUE CROSS/BLUE SHIELD | Source: Ambulatory Visit | Attending: Nurse Practitioner | Admitting: Nurse Practitioner

## 2015-08-06 DIAGNOSIS — Z90710 Acquired absence of both cervix and uterus: Secondary | ICD-10-CM | POA: Insufficient documentation

## 2015-08-06 DIAGNOSIS — R102 Pelvic and perineal pain: Secondary | ICD-10-CM | POA: Insufficient documentation

## 2015-08-06 DIAGNOSIS — N8320 Unspecified ovarian cysts: Secondary | ICD-10-CM | POA: Insufficient documentation

## 2015-08-06 DIAGNOSIS — N83201 Unspecified ovarian cyst, right side: Secondary | ICD-10-CM

## 2015-08-10 NOTE — Progress Notes (Signed)
[facility]

## 2015-08-16 ENCOUNTER — Ambulatory Visit: Payer: BLUE CROSS/BLUE SHIELD | Admitting: Nurse Practitioner

## 2015-08-23 NOTE — H&P (Signed)
Brenda [name] is an 44 y.o. female.   Chief Complaint:Increased macromastia HPI: Back and shoulder pain with intertrigo and severe pitting  Past Medical History  Diagnosis Date  . Asthma     Past Surgical History  Procedure Laterality Date  . Back surgery      3-4 yrs ago.  . Bladder surgery      incontinence  . Egd/ed      5 yr ago by Dr. Laural Golden    No family history on file. Social History:  reports that she has never smoked. She does not have any smokeless tobacco history on file. She reports that she does not drink alcohol or use illicit drugs.  Allergies: No Known Allergies  No prescriptions prior to admission    No results found for this or any previous visit (from the past 48 hour(s)). No results found.  Review of Systems  Constitutional: Negative.   HENT: Negative.   Eyes: Negative.   Respiratory: Negative.   Cardiovascular: Negative.   Gastrointestinal: Negative.   Genitourinary: Negative.   Musculoskeletal: Positive for myalgias and back pain.  Skin: Positive for itching and rash.  Neurological: Negative.   Endo/Heme/Allergies: Negative.   Psychiatric/Behavioral: Negative.     Last menstrual period 07/06/2004. Physical Exam   Assessment/Plan Severe macromastia with accessory breast tissue for bilateral breast reductions and excision of accessory breast tissue  Browning Southwood L 08/23/2015, 9:10 AM

## 2015-08-24 ENCOUNTER — Ambulatory Visit: Payer: Self-pay | Admitting: Specialist

## 2015-08-25 ENCOUNTER — Encounter (HOSPITAL_BASED_OUTPATIENT_CLINIC_OR_DEPARTMENT_OTHER): Payer: Self-pay | Admitting: *Deleted

## 2015-08-30 ENCOUNTER — Ambulatory Visit (HOSPITAL_BASED_OUTPATIENT_CLINIC_OR_DEPARTMENT_OTHER): Payer: BLUE CROSS/BLUE SHIELD | Admitting: Anesthesiology

## 2015-08-30 ENCOUNTER — Encounter (HOSPITAL_BASED_OUTPATIENT_CLINIC_OR_DEPARTMENT_OTHER): Payer: Self-pay

## 2015-08-30 ENCOUNTER — Ambulatory Visit (HOSPITAL_BASED_OUTPATIENT_CLINIC_OR_DEPARTMENT_OTHER)
Admission: RE | Admit: 2015-08-30 | Discharge: 2015-08-30 | Disposition: A | Discharge status: Home / Self Care | Payer: BLUE CROSS/BLUE SHIELD | Source: Ambulatory Visit | Attending: Specialist | Admitting: Specialist

## 2015-08-30 ENCOUNTER — Encounter (HOSPITAL_BASED_OUTPATIENT_CLINIC_OR_DEPARTMENT_OTHER)
Admission: RE | Disposition: A | Discharge status: Home / Self Care | Payer: Self-pay | Source: Ambulatory Visit | Attending: Specialist

## 2015-08-30 DIAGNOSIS — Q831 Accessory breast: Secondary | ICD-10-CM | POA: Insufficient documentation

## 2015-08-30 DIAGNOSIS — N62 Hypertrophy of breast: Secondary | ICD-10-CM | POA: Insufficient documentation

## 2015-08-30 DIAGNOSIS — Z6831 Body mass index (BMI) 31.0-31.9, adult: Secondary | ICD-10-CM | POA: Diagnosis not present

## 2015-08-30 DIAGNOSIS — E669 Obesity, unspecified: Secondary | ICD-10-CM | POA: Insufficient documentation

## 2015-08-30 HISTORY — DX: Other seasonal allergic rhinitis: J30.2

## 2015-08-30 HISTORY — DX: Gastro-esophageal reflux disease without esophagitis: K21.9

## 2015-08-30 HISTORY — PX: BREAST REDUCTION SURGERY: SHX8

## 2015-08-30 SURGERY — BREAST REDUCTION WITH LIPOSUCTION
Anesthesia: General | Site: Breast | Laterality: Bilateral

## 2015-08-30 MED ORDER — EPINEPHRINE HCL 1 MG/ML IJ SOLN
INTRAMUSCULAR | Status: AC
  Filled 2015-08-30: qty 1

## 2015-08-30 MED ORDER — ONDANSETRON HCL 4 MG/2ML IJ SOLN
INTRAMUSCULAR | Status: DC | PRN
  Administered 2015-08-30: 4 mg via INTRAVENOUS

## 2015-08-30 MED ORDER — SODIUM CHLORIDE 0.9 % IV SOLN
INTRAVENOUS | Status: DC | PRN
  Administered 2015-08-30: 400 mL via INTRAMUSCULAR

## 2015-08-30 MED ORDER — LACTATED RINGERS IV SOLN
INTRAVENOUS | Status: DC
  Administered 2015-08-30: 07:00:00 via INTRAVENOUS

## 2015-08-30 MED ORDER — SODIUM BICARBONATE 4 % IV SOLN
INTRAVENOUS | Status: AC
  Filled 2015-08-30: qty 20

## 2015-08-30 MED ORDER — MORPHINE SULFATE 10 MG/ML IJ SOLN
INTRAMUSCULAR | Status: DC | PRN
  Administered 2015-08-30 (×4): 2 mg via INTRAVENOUS

## 2015-08-30 MED ORDER — SUCCINYLCHOLINE CHLORIDE 20 MG/ML IJ SOLN
INTRAMUSCULAR | Status: AC
  Filled 2015-08-30: qty 1

## 2015-08-30 MED ORDER — MIDAZOLAM HCL 2 MG/2ML IJ SOLN
INTRAMUSCULAR | Status: AC
  Filled 2015-08-30: qty 4

## 2015-08-30 MED ORDER — LIDOCAINE HCL (CARDIAC) 20 MG/ML IV SOLN
INTRAVENOUS | Status: DC | PRN
  Administered 2015-08-30: 50 mg via INTRAVENOUS

## 2015-08-30 MED ORDER — PROPOFOL 10 MG/ML IV BOLUS
INTRAVENOUS | Status: DC | PRN
  Administered 2015-08-30: 150 mg via INTRAVENOUS
  Administered 2015-08-30: 20 mg via INTRAVENOUS

## 2015-08-30 MED ORDER — DEXAMETHASONE SODIUM PHOSPHATE 10 MG/ML IJ SOLN
INTRAMUSCULAR | Status: AC
  Filled 2015-08-30: qty 1

## 2015-08-30 MED ORDER — MORPHINE SULFATE (PF) 10 MG/ML IV SOLN
INTRAVENOUS | Status: AC
  Filled 2015-08-30: qty 1

## 2015-08-30 MED ORDER — FENTANYL CITRATE (PF) 100 MCG/2ML IJ SOLN
INTRAMUSCULAR | Status: AC
  Filled 2015-08-30: qty 4

## 2015-08-30 MED ORDER — CEFAZOLIN SODIUM-DEXTROSE 2-3 GM-% IV SOLR
2.0000 g | INTRAVENOUS | Status: AC
  Administered 2015-08-30: 2 g via INTRAVENOUS

## 2015-08-30 MED ORDER — DEXAMETHASONE SODIUM PHOSPHATE 4 MG/ML IJ SOLN
INTRAMUSCULAR | Status: DC | PRN
  Administered 2015-08-30: 10 mg via INTRAVENOUS

## 2015-08-30 MED ORDER — LIDOCAINE HCL 2 % IJ SOLN
INTRAMUSCULAR | Status: AC
  Filled 2015-08-30: qty 80

## 2015-08-30 MED ORDER — OXYCODONE-ACETAMINOPHEN 5-325 MG PO TABS
ORAL_TABLET | ORAL | Status: AC
  Filled 2015-08-30: qty 1

## 2015-08-30 MED ORDER — PROPOFOL 500 MG/50ML IV EMUL
INTRAVENOUS | Status: AC
  Filled 2015-08-30: qty 50

## 2015-08-30 MED ORDER — FENTANYL CITRATE (PF) 100 MCG/2ML IJ SOLN
50.0000 ug | INTRAMUSCULAR | Status: DC | PRN
  Administered 2015-08-30: 100 ug via INTRAVENOUS

## 2015-08-30 MED ORDER — FENTANYL CITRATE (PF) 100 MCG/2ML IJ SOLN: 25.0000 ug | INTRAMUSCULAR | Status: DC | PRN

## 2015-08-30 MED ORDER — GLYCOPYRROLATE 0.2 MG/ML IJ SOLN: 0.2000 mg | Freq: Once | INTRAMUSCULAR | Status: DC | PRN

## 2015-08-30 MED ORDER — MIDAZOLAM HCL 2 MG/2ML IJ SOLN
1.0000 mg | INTRAMUSCULAR | Status: DC | PRN
  Administered 2015-08-30: 2 mg via INTRAVENOUS

## 2015-08-30 MED ORDER — ONDANSETRON HCL 4 MG/2ML IJ SOLN
INTRAMUSCULAR | Status: AC
  Filled 2015-08-30: qty 2

## 2015-08-30 MED ORDER — PROMETHAZINE HCL 25 MG/ML IJ SOLN: 6.2500 mg | INTRAMUSCULAR | Status: DC | PRN

## 2015-08-30 MED ORDER — ESCITALOPRAM OXALATE 10 MG PO TABS: 10.0000 mg | ORAL_TABLET | Freq: Every day | ORAL | Status: DC

## 2015-08-30 MED ORDER — OXYCODONE-ACETAMINOPHEN 5-325 MG PO TABS
1.0000 | ORAL_TABLET | Freq: Once | ORAL | Status: AC
  Administered 2015-08-30: 1 via ORAL

## 2015-08-30 MED ORDER — LIDOCAINE-EPINEPHRINE 0.5 %-1:200000 IJ SOLN
INTRAMUSCULAR | Status: AC
  Filled 2015-08-30: qty 4

## 2015-08-30 MED ORDER — CEFAZOLIN SODIUM-DEXTROSE 2-3 GM-% IV SOLR
INTRAVENOUS | Status: AC
  Filled 2015-08-30: qty 50

## 2015-08-30 MED ORDER — LIDOCAINE HCL (CARDIAC) 20 MG/ML IV SOLN
INTRAVENOUS | Status: AC
  Filled 2015-08-30: qty 5

## 2015-08-30 MED ORDER — SCOPOLAMINE 1 MG/3DAYS TD PT72: 1.0000 | MEDICATED_PATCH | Freq: Once | TRANSDERMAL | Status: DC | PRN

## 2015-08-30 SURGICAL SUPPLY — 67 items
APL SKNCLS STERI-STRIP NONHPOA (GAUZE/BANDAGES/DRESSINGS) ×4
BAG DECANTER FOR FLEXI CONT (MISCELLANEOUS) ×4 IMPLANT
BENZOIN TINCTURE PRP APPL 2/3 (GAUZE/BANDAGES/DRESSINGS) ×8 IMPLANT
BINDER BREAST XLRG (GAUZE/BANDAGES/DRESSINGS) ×3 IMPLANT
BINDER BREAST XXLRG (GAUZE/BANDAGES/DRESSINGS) IMPLANT
BLADE KNIFE  20 PERSONNA (BLADE) ×2
BLADE KNIFE 20 PERSONNA (BLADE) ×3 IMPLANT
BLADE KNIFE PERSONA 10 (BLADE) ×6 IMPLANT
BLADE KNIFE PERSONA 15 (BLADE) ×4 IMPLANT
CANISTER SUCT 1200ML W/VALVE (MISCELLANEOUS) ×4 IMPLANT
COVER BACK TABLE 60X90IN (DRAPES) ×4 IMPLANT
COVER MAYO STAND STRL (DRAPES) ×4 IMPLANT
DECANTER SPIKE VIAL GLASS SM (MISCELLANEOUS) ×8 IMPLANT
DRAIN CHANNEL 10F 3/8 F FF (DRAIN) ×8 IMPLANT
DRAPE LAPAROSCOPIC ABDOMINAL (DRAPES) ×4 IMPLANT
DRAPE UTILITY XL STRL (DRAPES) ×4 IMPLANT
DRSG PAD ABDOMINAL 8X10 ST (GAUZE/BANDAGES/DRESSINGS) ×16 IMPLANT
ELECT REM PT RETURN 9FT ADLT (ELECTROSURGICAL) ×4
ELECTRODE REM PT RTRN 9FT ADLT (ELECTROSURGICAL) ×2 IMPLANT
EVACUATOR SILICONE 100CC (DRAIN) ×8 IMPLANT
GAUZE SPONGE 4X4 12PLY STRL (GAUZE/BANDAGES/DRESSINGS) ×8 IMPLANT
GAUZE XEROFORM 5X9 LF (GAUZE/BANDAGES/DRESSINGS) ×8 IMPLANT
GLOVE BIO SURGEON STRL SZ 6.5 (GLOVE) ×3 IMPLANT
GLOVE BIO SURGEONS STRL SZ 6.5 (GLOVE) ×1
GLOVE BIOGEL M STRL SZ7.5 (GLOVE) ×4 IMPLANT
GLOVE BIOGEL PI IND STRL 8 (GLOVE) ×2 IMPLANT
GLOVE BIOGEL PI INDICATOR 8 (GLOVE) ×2
GLOVE ECLIPSE 7.0 STRL STRAW (GLOVE) ×4 IMPLANT
GLOVE SURG SS PI 6.0 STRL IVOR (GLOVE) ×3 IMPLANT
GLOVE SURG SS PI 7.5 STRL IVOR (GLOVE) ×3 IMPLANT
GLOVE SURG SS PI 8.0 STRL IVOR (GLOVE) ×3 IMPLANT
GOWN STRL REUS W/ TWL XL LVL3 (GOWN DISPOSABLE) ×4 IMPLANT
GOWN STRL REUS W/TWL XL LVL3 (GOWN DISPOSABLE) ×8
IV NS 500ML (IV SOLUTION) ×4
IV NS 500ML BAXH (IV SOLUTION) ×2 IMPLANT
NDL SPNL 18GX3.5 QUINCKE PK (NEEDLE) ×1 IMPLANT
NEEDLE SPNL 18GX3.5 QUINCKE PK (NEEDLE) ×4 IMPLANT
NS IRRIG 1000ML POUR BTL (IV SOLUTION) ×6 IMPLANT
PACK BASIN DAY SURGERY FS (CUSTOM PROCEDURE TRAY) ×4 IMPLANT
PEN SKIN MARKING BROAD TIP (MISCELLANEOUS) ×7 IMPLANT
PILLOW FOAM RUBBER ADULT (PILLOWS) ×4 IMPLANT
PIN SAFETY STERILE (MISCELLANEOUS) ×4 IMPLANT
SHEETING SILICONE GEL EPI DERM (MISCELLANEOUS) IMPLANT
SLEEVE SCD COMPRESS KNEE MED (MISCELLANEOUS) ×4 IMPLANT
SPECIMEN JAR MEDIUM (MISCELLANEOUS) IMPLANT
SPECIMEN JAR X LARGE (MISCELLANEOUS) ×8 IMPLANT
SPONGE LAP 18X18 X RAY DECT (DISPOSABLE) ×16 IMPLANT
STRIP SUTURE WOUND CLOSURE 1/2 (SUTURE) ×20 IMPLANT
SUT MNCRL AB 3-0 PS2 18 (SUTURE) ×24 IMPLANT
SUT MON AB 2-0 CT1 36 (SUTURE) IMPLANT
SUT MON AB 5-0 PS2 18 (SUTURE) ×8 IMPLANT
SUT PROLENE 3 0 PS 2 (SUTURE) ×24 IMPLANT
SYR 50ML LL SCALE MARK (SYRINGE) ×8 IMPLANT
SYR CONTROL 10ML LL (SYRINGE) IMPLANT
TAPE HYPAFIX 6 X30' (GAUZE/BANDAGES/DRESSINGS) ×1
TAPE HYPAFIX 6X30 (GAUZE/BANDAGES/DRESSINGS) ×3 IMPLANT
TAPE MEASURE 72IN RETRACT (INSTRUMENTS) ×4
TAPE MEASURE LINEN 72IN RETRCT (INSTRUMENTS) ×2 IMPLANT
TAPE PAPER 1X10 WHT MICROPORE (GAUZE/BANDAGES/DRESSINGS) ×4 IMPLANT
TOWEL OR 17X24 6PK STRL BLUE (TOWEL DISPOSABLE) ×8 IMPLANT
TOWEL OR NON WOVEN STRL DISP B (DISPOSABLE) ×4 IMPLANT
TUBE CONNECTING 20'X1/4 (TUBING) ×1
TUBE CONNECTING 20X1/4 (TUBING) ×3 IMPLANT
TUBING SET GRADUATE ASPIR 12FT (MISCELLANEOUS) ×3 IMPLANT
UNDERPAD 30X30 (UNDERPADS AND DIAPERS) ×12 IMPLANT
VAC PENCILS W/TUBING CLEAR (MISCELLANEOUS) ×4 IMPLANT
YANKAUER SUCT BULB TIP NO VENT (SUCTIONS) ×4 IMPLANT

## 2015-08-30 NOTE — Brief Op Note (Signed)
08/30/2015  9:58 AM  PATIENT:  Brenda [name]  44 y.o. female  PRE-OPERATIVE DIAGNOSIS:  MACROMASTIA  POST-OPERATIVE DIAGNOSIS:  Macromastia  PROCEDURE:  Procedure(s): BREAST REDUCTION WITH LIPOSUCTION (Bilateral)  SURGEON:  Surgeon(s) and Role:    * [name], MD - Primary  PHYSICIAN ASSISTANT:   ASSISTANTS: none   ANESTHESIA:   general  EBL:  Total I/O In: 1700 [I.V.:1700] Out: -   [name] ADMINISTERED:none  DRAINS: (mm) Jackson-Pratt drain(s) with closed bulb suction in the right and left lateral chest areas   LOCAL MEDICATIONS USED:  XYLOCAINE   SPECIMEN:  Excision  DISPOSITION OF SPECIMEN:  PATHOLOGY  COUNTS:  YES  TOURNIQUET:  * No tourniquets in log *  DICTATION: .Other Dictation: Dictation Number [id number]  PLAN OF CARE: Discharge to home after PACU  PATIENT DISPOSITION:  PACU - hemodynamically stable.   Delay start of Pharmacological VTE agent (>24hrs) due to surgical [name] loss or risk of bleeding: yes

## 2015-08-30 NOTE — Anesthesia Preprocedure Evaluation (Addendum)
Anesthesia Evaluation  Patient identified by MRN, date of birth, ID band Patient awake    Reviewed: Allergy & Precautions, NPO status , Patient's Chart, lab work & pertinent test results  History of Anesthesia Complications Negative for: history of anesthetic complications  Airway Mallampati: I  TM Distance: >3 FB Neck ROM: Full    Dental  (+) Teeth Intact, Dental Advisory Given   Pulmonary asthma ,    Pulmonary exam normal breath sounds clear to auscultation       Cardiovascular Exercise Tolerance: Good (-) hypertension(-) angina(-) Past MI negative cardio ROS Normal cardiovascular exam Rhythm:Regular Rate:Normal     Neuro/Psych PSYCHIATRIC DISORDERS Anxiety Depression negative neurological ROS     GI/Hepatic Neg liver ROS, GERD  ,  Endo/Other  Obesity   Renal/GU negative Renal ROS     Musculoskeletal negative musculoskeletal ROS (+)   Abdominal   Peds  Hematology negative hematology ROS (+)   Anesthesia Other Findings Day of surgery medications reviewed with the patient.  Reproductive/Obstetrics                            Anesthesia Physical Anesthesia Plan  ASA: II  Anesthesia Plan: General   Post-op Pain Management:    Induction: Intravenous  Airway Management Planned: LMA  Additional Equipment:   Intra-op Plan:   Post-operative Plan: Extubation in OR  Informed Consent: I have reviewed the patients History and Physical, chart, labs and discussed the procedure including the risks, benefits and alternatives for the proposed anesthesia with the patient or authorized representative who has indicated his/her understanding and acceptance.   Dental advisory given  Plan Discussed with: CRNA  Anesthesia Plan Comments: (Risks/benefits of general anesthesia discussed with patient including risk of damage to teeth, lips, gum, and tongue, nausea/vomiting, allergic reactions to  medications, and the possibility of heart attack, stroke and death.  All patient questions answered.  Patient wishes to proceed.)       Anesthesia Quick Evaluation

## 2015-08-30 NOTE — Anesthesia Postprocedure Evaluation (Signed)
  Anesthesia Post-op Note  Patient: Brenda [name]  Procedure(s) Performed: Procedure(s) (LRB): BREAST REDUCTION WITH LIPOSUCTION (Bilateral)  Patient Location: PACU  Anesthesia Type: General  Level of Consciousness: awake and alert   Airway and Oxygen Therapy: Patient Spontanous Breathing  Post-op Pain: mild  Post-op Assessment: Post-op Vital signs reviewed, Patient's Cardiovascular Status Stable, Respiratory Function Stable, Patent Airway and No signs of Nausea or vomiting  Last Vitals:  Filed Vitals:   08/30/15 1100  BP: 121/91  Pulse: 82  Temp:   Resp: 12    Post-op Vital Signs: stable   Complications: No apparent anesthesia complications

## 2015-08-30 NOTE — Transfer of Care (Signed)
Immediate Anesthesia Transfer of Care Note  Patient: Brenda [name]  Procedure(s) Performed: Procedure(s): BREAST REDUCTION WITH LIPOSUCTION (Bilateral)  Patient Location: PACU  Anesthesia Type:General  Level of Consciousness: awake and sedated  Airway & Oxygen Therapy: Patient Spontanous Breathing and Patient connected to face mask oxygen  Post-op Assessment: Report given to RN and Post -op Vital signs reviewed and stable  Post vital signs: Reviewed and stable  Last Vitals:  Filed Vitals:   08/30/15 0643  BP: 128/63  Pulse: 71  Temp: 36.6 C  Resp: 20    Complications: No apparent anesthesia complications

## 2015-08-30 NOTE — Anesthesia Procedure Notes (Signed)
Procedure Name: LMA Insertion Performed by: Terrance Mass Pre-anesthesia Checklist: Patient identified, Timeout performed, Emergency Drugs available, Patient being monitored and Suction available Patient Re-evaluated:Patient Re-evaluated prior to inductionOxygen Delivery Method: Circle system utilized Preoxygenation: Pre-oxygenation with 100% oxygen Intubation Type: IV induction Ventilation: Mask ventilation without difficulty LMA: LMA inserted LMA Size: 4.0 Tube type: Oral Number of attempts: 1 Placement Confirmation: positive ETCO2 Tube secured with: Tape Dental Injury: Teeth and Oropharynx as per pre-operative assessment

## 2015-08-30 NOTE — H&P (Signed)
Brenda [name] is an 44 y.o. female.   Chief Complaint: Increased macromastia with back pain HPI: Back and shoulder pain with increased intertrigo and accessory breast tissue  Past Medical History  Diagnosis Date  . Asthma   . Seasonal allergies   . GERD (gastroesophageal reflux disease)     Past Surgical History  Procedure Laterality Date  . Back surgery      3-4 yrs ago.  . Bladder surgery      incontinence  . Egd/ed      5 yr ago by Dr. Laural Golden  . Abdominal hysterectomy      History reviewed. No pertinent family history. Social History:  reports that she has never smoked. She does not have any smokeless tobacco history on file. She reports that she does not drink alcohol or use illicit drugs.  Allergies:  Allergies  Allergen Reactions  . Adhesive [Tape] Rash  . Latex Rash    Medications Prior to Admission  Medication Sig Dispense Refill  . albuterol (PROVENTIL HFA;VENTOLIN HFA) 108 (90 BASE) MCG/ACT inhaler Inhale 2 puffs into the lungs every 6 (six) hours as needed for wheezing or shortness of breath. 1 Inhaler 2  . escitalopram (LEXAPRO) 10 MG tablet Take 1 tablet (10 mg total) by mouth daily. 30 tablet 5  . fluticasone (FLONASE) 50 MCG/ACT nasal spray Place 2 sprays into both nostrils daily. Prn congestion 16 g 5  . fluticasone-salmeterol (ADVAIR HFA) 115-21 MCG/ACT inhaler Inhale 2 puffs into the lungs 2 (two) times daily. 1 Inhaler 5  . phentermine (ADIPEX-P) 37.5 MG tablet One each morning on an empty stomach 30 tablet 2  . triamcinolone cream (KENALOG) 0.1 % Apply 1 application topically 2 (two) times daily. 60 g 0  . albuterol (PROVENTIL) (2.5 MG/3ML) 0.083% nebulizer solution Take 3 mLs (2.5 mg total) by nebulization every 6 (six) hours as needed for wheezing or shortness of breath. 50 vial 5    No results found for this or any previous visit (from the past 48 hour(s)). No results found.  Review of Systems  Constitutional: Negative.   Eyes: Negative.    Respiratory: Negative.   Cardiovascular: Negative.   Gastrointestinal: Positive for heartburn.  Genitourinary: Negative.   Musculoskeletal: Positive for myalgias, back pain and neck pain.  Skin: Positive for rash.  Neurological: Negative.   Endo/Heme/Allergies: Negative.   Psychiatric/Behavioral: Negative.     Blood pressure 128/63, pulse 71, temperature 97.8 F (36.6 C), temperature source Oral, resp. rate 20, height 5\' 7"  (1.702 m), weight 90.901 kg (200 lb 6.4 oz), last menstrual period 07/06/2004, SpO2 100 %. Physical Exam   Assessment  Severe macromastia with accessory breast tissue for bilateral breast reductions with excision of accessory breast tissue Desmond Szabo L 08/30/2015, 7:13 AM

## 2015-08-30 NOTE — Discharge Instructions (Signed)
Breast Reduction Care After Refer to this sheet in the next few weeks. These instructions provide you with information on caring for yourself after your procedure. Your caregiver may also give you more specific instructions. Your treatment has been planned according to current medical practices, but problems sometimes occur. Call your caregiver if you have any problems or questions after your procedure. HOME CARE INSTRUCTIONS  Do not lift more than 5 pounds with one arm, or 10 pounds with both arms, for 1 month.   Do not sleep on your stomach for 4 to 6 weeks.   Do not do vigorous exercise such as bouncing, aerobics, or jumping for 6 weeks. Walking is not restricted.   Do not drive while you are taking prescription pain medicine.   Avoid prolonged sun exposure.   Keep dressings dry and clean  Measure jp drainage every 12 hrs and measure   You may slowly go back to your normal diet. Start with a light meal and increase as comfortable.   You may shower 24 hours after your drains are removed unless instructed differently by your caregiver.   Take your pain medicine as prescribed. Discomfort is normal after breast reduction surgery.   Keep the head of your bed elevated 40 degrees   : Call the office if you notice:  You have a fever.   You notice drainage from the incision that smells bad.   You have persistent pain.   You have persistent bleeding from the incision or nipple discharge.   You develop increased swelling or swelling that is greater in one breast than in the other.  MAKE SURE YOU:  1. Understand these instructions.  2. Will watch your condition.  3. Will get help right away if you are not doing well or get worse.  Document Released: 07/04/2004 Document Revised: 08/02/2011 Document Reviewed: 02/13/2008 [facility] Patient Information 2012 [place].   Post Anesthesia Home Care Instructions  Activity: Get plenty of rest for the remainder of the day. A  responsible adult should stay with you for 24 hours following the procedure.  For the next 24 hours, DO NOT: -Drive a car -Paediatric nurse -Drink alcoholic beverages -Take any medication unless instructed by your physician -Make any legal decisions or sign important papers.  Meals: Start with liquid foods such as gelatin or soup. Progress to regular foods as tolerated. Avoid greasy, spicy, heavy foods. If nausea and/or vomiting occur, drink only clear liquids until the nausea and/or vomiting subsides. Call your physician if vomiting continues.  Special Instructions/Symptoms: Your throat may feel dry or sore from the anesthesia or the breathing tube placed in your throat during surgery. If this causes discomfort, gargle with warm salt water. The discomfort should disappear within 24 hours.  If you had a scopolamine patch placed behind your ear for the management of post- operative nausea and/or vomiting:  1. The medication in the patch is effective for 72 hours, after which it should be removed.  Wrap patch in a tissue and discard in the trash. Wash hands thoroughly with soap and water. 2. You may remove the patch earlier than 72 hours if you experience unpleasant side effects which may include dry mouth, dizziness or visual disturbances. 3. Avoid touching the patch. Wash your hands with soap and water after contact with the patch.       JP Drain [employer] this sheet to all of your post-operative appointments while you have your drains.  Please measure your drains by CC's  or ML's.  Make sure you drain and measure your JP Drains 2 or 3 times per day.  At the end of each day, add up totals for the left side and add up totals for the right side.    ( 9 am )     ( 3 pm )        ( 9 pm )                Date L  R  L  R  L  R  Total L/R

## 2015-08-31 ENCOUNTER — Encounter (HOSPITAL_BASED_OUTPATIENT_CLINIC_OR_DEPARTMENT_OTHER): Payer: Self-pay | Admitting: Specialist

## 2015-08-31 NOTE — Op Note (Signed)
NAMESVEA, PUSCH                 ACCOUNT NO.:  [id number]  MEDICAL RECORD NO.:  [id number]  LOCATION:                               FACILITY:  [place]  PHYSICIAN:  Berneta Sages L. Alon Mazor, M.D.DATE OF BIRTH:  [birth year]  DATE OF PROCEDURE:  08/30/2015 DATE OF DISCHARGE:  08/30/2015                              OPERATIVE REPORT   INDICATIONS:  This 44 year old lady has severe macromastia, wears a size H bra, increased back and shoulder pain, intertriginous changes and pinning in both shoulder areas, resistant to conservative treatment.  PROCEDURES:  Bilateral breast reductions using the inferior pedicle technique.  The patient also demonstrates increased accessory breast tissue that could be controlled with excision and liposuction assistance.  DESCRIPTION OF PROCEDURE:  Preoperatively, the patient was set up and drawn for the reduction mammoplasty using the inferior pedicle technique.  Re-marked the nipple-areolar complexes over 34 cm up to 22. She underwent general anesthesia and intubated orally.  Prep was done to the chest and breast areas in routine fashion using Hibiclens soap and solution, walled off with sterile towels and drapes so as to make a sterile feel.  Xylocaine 0.25% with epinephrine 1:400,000 concentration was injected locally, 200 mL per side.  This was allowed to sit up. Then, the wounds were scored with #15 blades.  Skin over the inferior pedicle was de-epithelized with #20 blades.  Median and lateral fatty dermal pedicles were sized down to underlying pectoralis fascia. Hemostasis was maintained with the Bovie and on coagulation.  Next, a new keyhole area was reinduced __________.  After this, liposuction was fashioned over the right and left axillary lateral chest areas using a [place] catheter 28-3 and 4s, removing copious amounts of accessory breast tissue.  After proper hemostasis, the flaps were transposed and stayed with 3-0 Monocryl suture.  Subcutaneous  closure was done with 3-0 Monocryl x2 layers and running subcuticular stitch of 3-0 Monocryl and 5- 0 Monocryl throughout the inverted T.  The wounds were drained with #10 fully fluted Blake drains, which were placed in depths of wound and brought out through the lateral most portion of the incision and secured with 3-0 Prolene suture.  The wounds were cleansed.  Steri-Strips and sterile tape were applied, dressings, 4x4s, ABDs, Hypafix tape.  She withstood the procedures very well, taken to the recovery in good condition.  ESTIMATED BLOOD LOSS:  Less than 100 mL.  COMPLICATIONS:  None.  Nipple-areolar complexes were examined and showing excellent __________ and blood supply.     Odella Aquas. Towanda Malkin, M.D.    Elie Confer  D:  08/30/2015  T:  08/31/2015  Job:  [id number]

## 2015-09-14 ENCOUNTER — Telehealth: Payer: Self-pay | Admitting: *Deleted

## 2015-09-14 NOTE — Telephone Encounter (Signed)
Received referral from Dr. Neita Goodnight office for pt to follow back up w/ MD since Dr. Humphrey Rolls is gone.  I called the pt and confirmed 09/21/15 high risk appt w/ her.  Mailed calendar, welcoming packet and intake form to pt.  Placed a copy of the records in Dr. Ernestina Penna box and took one to HIM to scan.

## 2015-09-16 ENCOUNTER — Ambulatory Visit (INDEPENDENT_AMBULATORY_CARE_PROVIDER_SITE_OTHER): Payer: BLUE CROSS/BLUE SHIELD | Admitting: Family Medicine

## 2015-09-16 ENCOUNTER — Encounter: Payer: Self-pay | Admitting: Family Medicine

## 2015-09-16 VITALS — BP 120/76 | Ht 67.0 in | Wt 202.0 lb

## 2015-09-16 DIAGNOSIS — J329 Chronic sinusitis, unspecified: Secondary | ICD-10-CM

## 2015-09-16 DIAGNOSIS — J45901 Unspecified asthma with (acute) exacerbation: Secondary | ICD-10-CM

## 2015-09-16 DIAGNOSIS — M722 Plantar fascial fibromatosis: Secondary | ICD-10-CM

## 2015-09-16 DIAGNOSIS — J441 Chronic obstructive pulmonary disease with (acute) exacerbation: Secondary | ICD-10-CM | POA: Diagnosis not present

## 2015-09-16 MED ORDER — AMOXICILLIN-POT CLAVULANATE 875-125 MG PO TABS: 1.0000 | ORAL_TABLET | Freq: Two times a day (BID) | ORAL | Status: AC

## 2015-09-16 MED ORDER — PREDNISONE 10 MG PO TABS: ORAL_TABLET | ORAL | Status: DC

## 2015-09-16 MED ORDER — ETODOLAC 400 MG PO TABS: 400.0000 mg | ORAL_TABLET | Freq: Two times a day (BID) | ORAL | Status: DC

## 2015-09-16 NOTE — Progress Notes (Signed)
   Subjective:    Patient ID: Brenda [name], female    DOB: February 10, 1971, 44 y.o.   MRN: [id number]  Foot Pain This is a new problem. The current episode started more than 1 month ago. The problem occurs constantly. Associated symptoms include coughing. The symptoms are aggravated by walking. She has tried NSAIDs (Ibuprofen) for the symptoms.  Cough This is a new problem. The cough is productive of sputum. Associated symptoms include wheezing.   Saw the allergy doc, multi allergies and mites etc. now using Flonase faithfully. Tries not to miss it.  Pt now on inhaler Advair for her asthma equivalent. Unfortunately missing doses frequently. Patient states no other concerns this visit.  Right heel painful for the past six mo worse the last couple mo. Recalls no sudden injury.  Hitting the eliptical  Hurting right off the bat early int he morn  ibuorfen as needed helping some. Pains been on for several months. Occurred after starting to exercise  woring full time now,  Some prod cough, somewhat, not bad tightness, but still congest  Frontal headache progress over the past couple weeks.  Review of Systems  Respiratory: Positive for cough and wheezing.    no vomiting no diarrhea complete ROS otherwise negative     Objective:   Physical Exam  Alert moderate malaise frontal max or tenderness nasal congestion frontal neck supple. Lungs minimal wheeze no tachypnea heart regular in rhythm. Right heel distinct with tender good range of motion pulses sensation intact      Assessment & Plan:  Impression 1 plantar fasciitis discuss her great length #2 rhinosinusitis #3 allergic rhinitis improved #4 asthma ongoing with some noncompliance plan importance of compliance of Advair discussed. Advised prescribed. Local measures and exercise for heel discussed. Hold off an extra rash L discussed. Add anti-inflammatory medicine for heel. Prednisone also along with antibiotics WSL

## 2015-09-21 ENCOUNTER — Telehealth: Payer: Self-pay | Admitting: Hematology

## 2015-09-21 ENCOUNTER — Encounter: Payer: Self-pay | Admitting: Hematology

## 2015-09-21 ENCOUNTER — Ambulatory Visit (HOSPITAL_BASED_OUTPATIENT_CLINIC_OR_DEPARTMENT_OTHER): Payer: BLUE CROSS/BLUE SHIELD | Admitting: Hematology

## 2015-09-21 VITALS — BP 130/76 | HR 87 | Temp 98.0°F | Resp 18 | Ht 67.0 in | Wt 205.6 lb

## 2015-09-21 DIAGNOSIS — N62 Hypertrophy of breast: Secondary | ICD-10-CM | POA: Diagnosis not present

## 2015-09-21 DIAGNOSIS — N6091 Unspecified benign mammary dysplasia of right breast: Secondary | ICD-10-CM

## 2015-09-21 MED ORDER — RALOXIFENE HCL 60 MG PO TABS: 60.0000 mg | ORAL_TABLET | Freq: Every day | ORAL | Status: DC

## 2015-09-21 NOTE — Telephone Encounter (Signed)
per pof to sch pt appt-pt going back to work req to come 12/1-sch appt-gave pt copy of avs

## 2015-09-21 NOTE — Progress Notes (Signed)
[place]  Telephone:(336) [phone] Fax:(336) [place] Note   Patient Care Team: [name], MD as PCP - General 09/21/2015  Referring physician: Dr. Towanda Malkin  CHIEF COMPLAINTS/PURPOSE OF CONSULTATION:  Right breast atypical lobular hyperplasia (ALH)  HISTORY OF PRESENTING ILLNESS:  Brenda [name] 44 y.o. female is here because of her recently diagnosed [place]. She is accompanied by her mother to the clinic today. She is being referred by her surgeon Dr. Towanda Malkin.   She had history of right breast atypical lobular hyperplasia in 2012. The was discovered by her screening mammogram, and she subsequently underwent right lumpectomy by Dr. Donne Hazel. She was referred to medical oncologist Dr. Humphrey Rolls, who recommended tamoxifen. She did take it for one and half months, but stopped it due to depression, mood swings, moderate hot flush, she stated " it made me crzay", and she did not follow up with Dr. Humphrey Rolls afterwards.. She has been doing screening mammogram annually since then, which has been normal.   She decided to have bilateral breast reduction a few months ago. This was done by Dr. Towanda Malkin on 08/30/2015. She tolerated the surgery well, continue tube was removed a few weeks ago. She still has moderate tenderness at the surgical sites, no other complaints. She otherwise feels well. Her surgical pathology reviewed right breast atypical lobular hyperplasia. And she was referred by Dr. Towanda Malkin to discuss chemoprevention.  MEDICAL HISTORY:  Past Medical History  Diagnosis Date  . Asthma   . Seasonal allergies   . GERD (gastroesophageal reflux disease)     SURGICAL HISTORY: Past Surgical History  Procedure Laterality Date  . Back surgery      3-4 yrs ago.  . Bladder surgery      incontinence  . Egd/ed      5 yr ago by Dr. Laural Golden  . Abdominal hysterectomy    . Breast reduction surgery Bilateral 08/30/2015    Procedure: BREAST REDUCTION WITH  LIPOSUCTION;  Surgeon: [name], MD;  Location: [facility];  Service: Plastics;  Laterality: Bilateral;   GYN HISTORY  Menarchal: 9 LMP: hysterectomy at age of 34  Contraceptive:  HRT: n/a  G3P2, one misscarage     SOCIAL HISTORY: Social History   Social History  . Marital Status: Married    Spouse Name: N/A  . Number of Children: 3   . Years of Education: N/A   Occupational History  . Not on file.   Social History Main Topics  . Smoking status: Never Smoker   . Smokeless tobacco: Not on file  . Alcohol Use: No  . Drug Use: No  . Sexual Activity: Not on file   Other Topics Concern  . Not on file   Social History Narrative    FAMILY HISTORY: History reviewed. No pertinent family history.  ALLERGIES:  is allergic to adhesive and latex.  MEDICATIONS:  Current Outpatient Prescriptions  Medication Sig Dispense Refill  . albuterol (PROVENTIL HFA;VENTOLIN HFA) 108 (90 BASE) MCG/ACT inhaler Inhale 2 puffs into the lungs every 6 (six) hours as needed for wheezing or shortness of breath. 1 Inhaler 2  . amoxicillin-clavulanate (AUGMENTIN) 875-125 MG tablet Take 1 tablet by mouth 2 (two) times daily. 20 tablet 0  . escitalopram (LEXAPRO) 10 MG tablet Take 1 tablet (10 mg total) by mouth daily. 30 tablet 5  . etodolac (LODINE) 400 MG tablet Take 1 tablet (400 mg total) by mouth 2 (two) times daily. 28 tablet 1  .  fluticasone (FLONASE) 50 MCG/ACT nasal spray Place 2 sprays into both nostrils daily. Prn congestion 16 g 5  . fluticasone-salmeterol (ADVAIR HFA) 115-21 MCG/ACT inhaler Inhale 2 puffs into the lungs 2 (two) times daily. 1 Inhaler 5  . predniSONE (DELTASONE) 10 MG tablet Four qd for three d, three qd for three d two qd for two d 25 tablet 0   No current facility-administered medications for this visit.    REVIEW OF SYSTEMS:   Constitutional: Denies fevers, chills or abnormal night sweats Eyes: Denies blurriness of vision, double vision or  watery eyes Ears, nose, mouth, throat, and face: Denies mucositis or sore throat Respiratory: Denies cough, dyspnea or wheezes Cardiovascular: Denies palpitation, chest discomfort or lower extremity swelling Gastrointestinal:  Denies nausea, heartburn or change in bowel habits Skin: Denies abnormal skin rashes Lymphatics: Denies new lymphadenopathy or easy bruising Neurological:Denies numbness, tingling or new weaknesses Behavioral/Psych: Mood is stable, no new changes  All other systems were reviewed with the patient and are negative.  PHYSICAL EXAMINATION: ECOG PERFORMANCE STATUS: 1 - Symptomatic but completely ambulatory  Filed Vitals:   09/21/15 1059  BP: 130/76  Pulse: 87  Temp: 98 F (36.7 C)  Resp: 18   Filed Weights   09/21/15 1059  Weight: 205 lb 9.6 oz (93.26 kg)    GENERAL:alert, no distress and comfortable SKIN: skin color, texture, turgor are normal, no rashes or significant lesions EYES: normal, conjunctiva are pink and non-injected, sclera clear OROPHARYNX:no exudate, no erythema and lips, buccal mucosa, and tongue normal  NECK: supple, thyroid normal size, non-tender, without nodularity LYMPH:  no palpable lymphadenopathy in the cervical, axillary or inguinal LUNGS: clear to auscultation and percussion with normal breathing effort HEART: regular rate & rhythm and no murmurs and no lower extremity edema ABDOMEN:abdomen soft, non-tender and normal bowel sounds Musculoskeletal:no cyanosis of digits and no clubbing  PSYCH: alert & oriented x 3 with fluent speech NEURO: no focal motor/sensory deficits  LABORATORY DATA:  I have reviewed the data as listed Lab Results  Component Value Date   WBC 8.7 04/21/2014   HGB 12.9 04/21/2014   HCT 39.2 04/21/2014   MCV 82.5 04/21/2014   PLT 314 04/21/2014    Recent Labs  04/26/15 0947  NA 140  K 4.5  CL 101  CO2 24  GLUCOSE 92  BUN 9  CREATININE 0.78  CALCIUM 9.1  GFRNONAA 92  GFRAA 106  PROT 6.5    ALBUMIN 4.0  AST 25  ALT 35*  ALKPHOS 91  BILITOT 0.4  BILIDIR 0.11   PATHOLOGY REPORT Diagnosis 08/30/2015 1. Breast, Mammoplasty, Left - FIBROCYSTIC CHANGES WITH ADENOSIS AND CALCIFICATIONS. - PSEUDOANGIOMATOUS STROMAL HYPERPLASIA ([place]). - THERE IS NO EVIDENCE OF MALIGNANCY. 2. Breast, Mammoplasty, Right - LOBULAR NEOPLASIA (ATYPICAL LOBULAR HYPERPLASIA). - FIBROCYST CHANGES WITH ADENOSIS AND CALCIFICATIONS. - PSEUDOANGIOMATOUS STROMAL HYPERPLASIA ([place]). - SEE COMMENT.  RADIOGRAPHIC STUDIES:  I have personally reviewed the radiological images as listed and agreed with the findings in the report. No results found.  ASSESSMENT & PLAN:  44 year old premenopausal Caucasian female  #1 Right breast atypical lobular hyperplasia in 2012 and 2016  -I discussed her imaging findings and surgical pathology results with her in great details. -We reviewed the natural history of atypical hyperplasia. It is consider a benign breast disease, however it does increase the risk of breast cancer by 3-5 fold. It is considered as a high risk for breast cancer. -We also reviewed other risk factors for breast cancer. She does not  have family history of breast or ovary cancer, or significant as the risks except obesity. -We discussed annual screening mammogram, which will detect early stage breast cancer. She agrees to continue. She is very compliant on screening. Due to the high risk of breast cancer, we can also consider screening breast MRI once a year, if her insurance covers it. -We also discussed healthy diet and regular exercise, calcium and vitamin D supplement, to reduce her risk of breast cancer -We further discussed chemoprevention to breast cancer. I discussed the option of tamoxifen, raloxifene and anastrozole. These endocrine therapy agent is likely going to reduce the risk of breast cancer by 30-40%, however there is no data of survival benefit so far.  -She is premenopausal, previously  tolerated tamoxifen poorly. She does not want to go back to tamoxifen. But she is highly motivated, would like to take chemoprevention. -Raloxifene and anastrozole are only approved for postmenopausal woman. There was a early phase study of Raloxifen in high-risk premenopausal woman to prevent breast cancer, the study result has not been published. Since raloxifen is also a estrogen receptor modulator, theoretically it would work for pre-menopausal woman also. She is willing to try raloxifene -The potential side effects of Raloxifene were discussed with patient, especially hot flash, mood swings, skin and vaginal dryness, increased risk of thrombosis, etc. -I give her a prescription of raloxifene 60mg  daily, and recommend her to start on 10/05/2015, when she recovers better from her surgery. -I recommend her to increase her Lexapro from 5 mg to 10 mg daily in the next few weeks, to better control her side effects from Raloxifene especially mood swing and hot flashes. -If she tolerates Raloxifene well, I will try to switch her to Tamoxifen to see if she can tolerate. Total treatment duration is 5 years   #2 Bone health -she never had bone density scan  -She is on vitamins D and calcium supplement -I discussed tamoxifen and raloxifene can increased her bone density.  Plan -start Raloxifene on 11/12016  -I plan to see her back in 5 weeks for follow up.   All questions were answered. The patient knows to call the clinic with any problems, questions or concerns. I spent 55 minutes counseling the patient face to face. The total time spent in the appointment was 60 minutes and more than 50% was on counseling.     Truitt Merle, MD 09/21/2015 11:40 AM

## 2015-10-07 ENCOUNTER — Telehealth: Payer: Self-pay | Admitting: Family Medicine

## 2015-10-07 ENCOUNTER — Other Ambulatory Visit: Payer: Self-pay | Admitting: *Deleted

## 2015-10-07 MED ORDER — PREDNISONE 20 MG PO TABS
ORAL_TABLET | ORAL | Status: DC
Start: 2015-10-07 — End: 2015-11-04

## 2015-10-07 NOTE — Telephone Encounter (Signed)
Adult pred taper 

## 2015-10-07 NOTE — Telephone Encounter (Signed)
Med sent to pharm. Pt notified.  

## 2015-10-07 NOTE — Telephone Encounter (Signed)
Pt seen on 10/13 rhinosinitis  Symptoms, productive cough is still present  She would like some steroids called in to help with her  This cough, says it's the only thing that usually knocks it out    [facility]

## 2015-11-03 ENCOUNTER — Other Ambulatory Visit: Payer: Self-pay | Admitting: *Deleted

## 2015-11-03 DIAGNOSIS — N6091 Unspecified benign mammary dysplasia of right breast: Secondary | ICD-10-CM

## 2015-11-03 DIAGNOSIS — N6092 Unspecified benign mammary dysplasia of left breast: Principal | ICD-10-CM

## 2015-11-04 ENCOUNTER — Telehealth: Payer: Self-pay | Admitting: Hematology

## 2015-11-04 ENCOUNTER — Ambulatory Visit (HOSPITAL_BASED_OUTPATIENT_CLINIC_OR_DEPARTMENT_OTHER): Payer: BLUE CROSS/BLUE SHIELD | Admitting: Hematology

## 2015-11-04 ENCOUNTER — Encounter: Payer: Self-pay | Admitting: Hematology

## 2015-11-04 ENCOUNTER — Other Ambulatory Visit (HOSPITAL_BASED_OUTPATIENT_CLINIC_OR_DEPARTMENT_OTHER): Payer: BLUE CROSS/BLUE SHIELD

## 2015-11-04 VITALS — BP 135/77 | HR 86 | Temp 97.9°F | Resp 20 | Ht 67.0 in | Wt 214.7 lb

## 2015-11-04 DIAGNOSIS — N6091 Unspecified benign mammary dysplasia of right breast: Secondary | ICD-10-CM | POA: Insufficient documentation

## 2015-11-04 DIAGNOSIS — N951 Menopausal and female climacteric states: Secondary | ICD-10-CM | POA: Diagnosis not present

## 2015-11-04 DIAGNOSIS — N6092 Unspecified benign mammary dysplasia of left breast: Principal | ICD-10-CM

## 2015-11-04 DIAGNOSIS — N62 Hypertrophy of breast: Secondary | ICD-10-CM | POA: Diagnosis not present

## 2015-11-04 LAB — CBC WITH DIFFERENTIAL/PLATELET
BASO%: 0.9 % (ref 0.0–2.0)
Basophils Absolute: 0.1 10*3/uL (ref 0.0–0.1)
EOS%: 10.6 % — ABNORMAL HIGH (ref 0.0–7.0)
Eosinophils Absolute: 1 10*3/uL — ABNORMAL HIGH (ref 0.0–0.5)
HCT: 39.2 % (ref 34.8–46.6)
HGB: 12.7 g/dL (ref 11.6–15.9)
LYMPH%: 25 % (ref 14.0–49.7)
MCH: 27.5 pg (ref 25.1–34.0)
MCHC: 32.4 g/dL (ref 31.5–36.0)
MCV: 84.8 fL (ref 79.5–101.0)
MONO#: 0.5 10*3/uL (ref 0.1–0.9)
MONO%: 5.6 % (ref 0.0–14.0)
NEUT#: 5.2 10*3/uL (ref 1.5–6.5)
NEUT%: 57.9 % (ref 38.4–76.8)
Platelets: 352 10*3/uL (ref 145–400)
RBC: 4.62 10*6/uL (ref 3.70–5.45)
RDW: 14.6 % — ABNORMAL HIGH (ref 11.2–14.5)
WBC: 9 10*3/uL (ref 3.9–10.3)
lymph#: 2.3 10*3/uL (ref 0.9–3.3)

## 2015-11-04 LAB — COMPREHENSIVE METABOLIC PANEL (CC13)
ALT: 11 U/L (ref 0–55)
AST: 16 U/L (ref 5–34)
Albumin: 3.5 g/dL (ref 3.5–5.0)
Alkaline Phosphatase: 87 U/L (ref 40–150)
Anion Gap: 7 mEq/L (ref 3–11)
BUN: 11.3 mg/dL (ref 7.0–26.0)
CO2: 27 mEq/L (ref 22–29)
Calcium: 9.4 mg/dL (ref 8.4–10.4)
Chloride: 108 mEq/L (ref 98–109)
Creatinine: 0.9 mg/dL (ref 0.6–1.1)
EGFR: 78 mL/min/{1.73_m2} — ABNORMAL LOW (ref >90)
Glucose: 97 mg/dl (ref 70–140)
Potassium: 4.5 mEq/L (ref 3.5–5.1)
Sodium: 143 mEq/L (ref 136–145)
Total Bilirubin: 0.37 mg/dL (ref 0.20–1.20)
Total Protein: 7.1 g/dL (ref 6.4–8.3)

## 2015-11-04 NOTE — Progress Notes (Signed)
[facility]  Telephone:(336) [phone] Fax:(336) [phone]  Clinic Follow up Note   Patient Care Team: [name], MD as PCP - General [name], MD as Consulting Physician (Plastic Surgery) 11/04/2015   CHIEF COMPLAINTS:  Follow up right breast atypical lobular hyperplasia (ALH)  HISTORY OF PRESENTING ILLNESS:  Brenda [name] 44 y.o. female is here because of her recently diagnosed [place]. She is accompanied by her mother to the clinic today. She is being referred by her surgeon Dr. Towanda Malkin.   She had history of right breast atypical lobular hyperplasia in 2012. The was discovered by her screening mammogram, and she subsequently underwent right lumpectomy by Dr. Donne Hazel. She was referred to medical oncologist Dr. Humphrey Rolls, who recommended tamoxifen. She did take it for one and half months, but stopped it due to depression, mood swings, moderate hot flush, she stated " it made me crzay", and she did not follow up with Dr. Humphrey Rolls afterwards.. She has been doing screening mammogram annually since then, which has been normal.   She decided to have bilateral breast reduction a few months ago. This was done by Dr. Towanda Malkin on 08/30/2015. She tolerated the surgery well, continue tube was removed a few weeks ago. She still has moderate tenderness at the surgical sites, no other complaints. She otherwise feels well. Her surgical pathology reviewed right breast atypical lobular hyperplasia. And she was referred by Dr. Towanda Malkin to discuss chemoprevention.  CURRENT THERAPY: Raloxifene 60 mg once daily, started on  10/05/2015   INTERIM HISTORY:  [place] returns for follow-up. She started Roxicet on 10/05/2015.  She still has moderate hot flush, slightly got worse after she started the medication. She also developed mild skin rash on face, and mild facial swelling, mild vaginal discharge in the first 3 weeks, which all resolved spontaneously. She also has mild bilateral breast  tenderness, she still wears sports bra, no skin erythema or swelling. She is otherwise doing well. No other new complaints.   MEDICAL HISTORY:  Past Medical History  Diagnosis Date  . Asthma   . Seasonal allergies   . GERD (gastroesophageal reflux disease)     SURGICAL HISTORY: Past Surgical History  Procedure Laterality Date  . Back surgery      3-4 yrs ago.  . Bladder surgery      incontinence  . Egd/ed      5 yr ago by Dr. Laural Golden  . Abdominal hysterectomy    . Breast reduction surgery Bilateral 08/30/2015    Procedure: BREAST REDUCTION WITH LIPOSUCTION;  Surgeon: [name], MD;  Location: [facility];  Service: Plastics;  Laterality: Bilateral;   GYN HISTORY  Menarchal: 11 LMP: hysterectomy at age of 80  Contraceptive:  HRT: n/a  G3P2, one misscarage     SOCIAL HISTORY: Social History   Social History  . Marital Status: Married    Spouse Name: N/A  . Number of Children: 3   . Years of Education: N/A   Occupational History  . Not on file.   Social History Main Topics  . Smoking status: Never Smoker   . Smokeless tobacco: Not on file  . Alcohol Use: No  . Drug Use: No  . Sexual Activity: Not on file   Other Topics Concern  . Not on file   Social History Narrative    FAMILY HISTORY: Family History  Problem Relation Age of Onset  . Cancer Mother 89    endometrial cancer and cervical cancer   .  Cancer Maternal Grandmother     prostate cancer    ALLERGIES:  is allergic to other; adhesive; and latex.  MEDICATIONS:  Current Outpatient Prescriptions  Medication Sig Dispense Refill  . albuterol (PROVENTIL HFA;VENTOLIN HFA) 108 (90 BASE) MCG/ACT inhaler Inhale 2 puffs into the lungs every 6 (six) hours as needed for wheezing or shortness of breath. 1 Inhaler 2  . cetirizine (ZYRTEC) 10 MG tablet Take 10 mg by mouth as needed for allergies.    Marland Kitchen escitalopram (LEXAPRO) 10 MG tablet Take 1 tablet (10 mg total) by mouth daily. 30  tablet 5  . esomeprazole (NEXIUM) 40 MG capsule Take 40 mg by mouth daily at 12 noon.    . fluticasone (FLONASE) 50 MCG/ACT nasal spray Place 2 sprays into both nostrils daily. Prn congestion 16 g 5  . fluticasone-salmeterol (ADVAIR HFA) 115-21 MCG/ACT inhaler Inhale 2 puffs into the lungs 2 (two) times daily. 1 Inhaler 5  . ibuprofen (ADVIL,MOTRIN) 200 MG tablet Take 600 mg by mouth as needed.    Marland Kitchen QVAR 80 MCG/ACT inhaler Inhale 2 puffs into the lungs 2 (two) times daily.    . raloxifene (EVISTA) 60 MG tablet Take 1 tablet (60 mg total) by mouth daily. 30 tablet 2   No current facility-administered medications for this visit.    REVIEW OF SYSTEMS:   Constitutional: Denies fevers, chills or abnormal night sweats Eyes: Denies blurriness of vision, double vision or watery eyes Ears, nose, mouth, throat, and face: Denies mucositis or sore throat Respiratory: Denies cough, dyspnea or wheezes Cardiovascular: Denies palpitation, chest discomfort or lower extremity swelling Gastrointestinal:  Denies nausea, heartburn or change in bowel habits Skin: Denies abnormal skin rashes Lymphatics: Denies new lymphadenopathy or easy bruising Neurological:Denies numbness, tingling or new weaknesses Behavioral/Psych: Mood is stable, no new changes  All other systems were reviewed with the patient and are negative.  PHYSICAL EXAMINATION: ECOG PERFORMANCE STATUS: 1 - Symptomatic but completely ambulatory  Filed Vitals:   11/04/15 1444  BP: 135/77  Pulse: 86  Temp: 97.9 F (36.6 C)  Resp: 20   Filed Weights   11/04/15 1444  Weight: 214 lb 11.2 oz (97.387 kg)    GENERAL:alert, no distress and comfortable SKIN: skin color, texture, turgor are normal, no rashes or significant lesions EYES: normal, conjunctiva are pink and non-injected, sclera clear OROPHARYNX:no exudate, no erythema and lips, buccal mucosa, and tongue normal  NECK: supple, thyroid normal size, non-tender, without nodularity LYMPH:   no palpable lymphadenopathy in the cervical, axillary or inguinal LUNGS: clear to auscultation and percussion with normal breathing effort HEART: regular rate & rhythm and no murmurs and no lower extremity edema ABDOMEN:abdomen soft, non-tender and normal bowel sounds Musculoskeletal:no cyanosis of digits and no clubbing  PSYCH: alert & oriented x 3 with fluent speech NEURO: no focal motor/sensory deficits Breasts: Breast inspection showed them to be symmetrical with no nipple discharge. B/l surgical scars are well healed. Palpation of the breasts and axilla revealed no obvious mass that I could appreciate. No skin changes.   LABORATORY DATA:  I have reviewed the data as listed Lab Results  Component Value Date   WBC 9.0 11/04/2015   HGB 12.7 11/04/2015   HCT 39.2 11/04/2015   MCV 84.8 11/04/2015   PLT 352 11/04/2015    Recent Labs  04/26/15 0947 11/04/15 1408  NA 140 143  K 4.5 4.5  CL 101  --   CO2 24 27  GLUCOSE 92 97  BUN 9 11.3  CREATININE 0.78 0.9  CALCIUM 9.1 9.4  GFRNONAA 92  --   GFRAA 106  --   PROT 6.5 7.1  ALBUMIN 4.0 3.5  AST 25 16  ALT 35* 11  ALKPHOS 91 87  BILITOT 0.4 0.37  BILIDIR 0.11  --    PATHOLOGY REPORT Diagnosis 08/30/2015 1. Breast, Mammoplasty, Left - FIBROCYSTIC CHANGES WITH ADENOSIS AND CALCIFICATIONS. - PSEUDOANGIOMATOUS STROMAL HYPERPLASIA ([place]). - THERE IS NO EVIDENCE OF MALIGNANCY. 2. Breast, Mammoplasty, Right - LOBULAR NEOPLASIA (ATYPICAL LOBULAR HYPERPLASIA). - FIBROCYST CHANGES WITH ADENOSIS AND CALCIFICATIONS. - PSEUDOANGIOMATOUS STROMAL HYPERPLASIA ([place]). - SEE COMMENT.  RADIOGRAPHIC STUDIES:  I have personally reviewed the radiological images as listed and agreed with the findings in the report. No results found.  ASSESSMENT & PLAN:  44 year old premenopausal Caucasian female  #1 Right breast atypical lobular hyperplasia in 2012 and 2016  -I previously discussed her imaging findings and surgical pathology results  with her in great details. -We reviewed the natural history of atypical hyperplasia. It is consider a benign breast disease, however it does increase the risk of breast cancer by 3-5 fold. It is considered as a high risk for breast cancer. -We also reviewed other risk factors for breast cancer. She does not have family history of breast or ovary cancer, or significant as the risks except obesity. -We discussed annual screening mammogram, which will detect early stage breast cancer. She agrees to continue. She is very compliant on screening. Due to the high risk of breast cancer and dense breast tissue (c), we can also consider screening breast MRI once a year, if her insurance covers it. -We also discussed healthy diet and regular exercise, calcium and vitamin D supplement, to reduce her risk of breast cancer -We further discussed chemoprevention to breast cancer. I discussed the option of tamoxifen, raloxifene and anastrozole. These endocrine therapy agent is likely going to reduce the risk of breast cancer by 30-40%, however there is no data of survival benefit so far.  -She is on chemoprevention with raloxifene, had some mild side effect at the beginning, now tolerating well. We'll continue for a total 5 years -She still has moderate hot flash, I recommend her to increase her Lexapro from 10mg  to 20 mg daily in the next few weeks, to better control her side effects from Raloxifene especially mood swing and hot flashes.   #2 Bone health -she never had bone density scan  -She is on vitamins D and calcium supplement -I discussed raloxifene can increased her bone density.  #3. Hot flush  - increase her Lexapro from 10mg  to 20 mg daily  #4. Anxiety and depression, GERD -She'll continue follow-up with her primary care physician  Plan -continue Raloxifene  -I plan to see her back in 4 months for follow up.   All questions were answered. The patient knows to call the clinic with any problems,  questions or concerns. I spent 20 minutes counseling the patient face to face. The total time spent in the appointment was 25 minutes and more than 50% was on counseling.     Truitt Merle, MD 11/04/2015 3:03 PM

## 2015-11-04 NOTE — Addendum Note (Signed)
Addended by: Elray Buba LE on: 11/04/2015 05:15 PM   Modules accepted: Orders

## 2015-11-04 NOTE — Telephone Encounter (Signed)
per pof to sch pt appt-gave pt copy of avs °

## 2015-11-10 ENCOUNTER — Encounter: Payer: Self-pay | Admitting: *Deleted

## 2015-11-10 NOTE — Progress Notes (Signed)
[place] Psychosocial Distress Screening Clinical Social Work  Clinical Social Work was referred by distress screening protocol.  The patient scored a 5 on the Psychosocial Distress Thermometer which indicates moderate distress. Clinical Social Worker reviewed chart and phoned pt to assess for distress and other psychosocial needs. CSW made several attempts to contact pt on her cell phone as listed and it appears to not be in service. CSW is available to follow up at future appointments as needed.   [facility] DISTRESS SCREENING 11/04/2015  Screening Type Initial Screening  Distress experienced in past week (1-10) 5  Practical problem type Work/school  Emotional problem type Boredom;Adjusting to appearance changes  Spiritual/Religous concerns type Relating to God  Information Concerns Type Lack of info about diagnosis;Lack of info about treatment  Physical Problem type Breathing;Constipation/diarrhea;Sexual problems  Other Having problem with my heel on right foot.  I am just worrying about a lot of different stuff.   Calling me or text me.    Clinical Social Worker follow up needed: Yes.    If yes, follow up plan: See above Loren Racer, [place] Worker [place]  [facility] Phone: [phone] Fax: [phone]

## 2015-11-30 ENCOUNTER — Other Ambulatory Visit: Payer: Self-pay | Admitting: *Deleted

## 2015-11-30 MED ORDER — ESCITALOPRAM OXALATE 10 MG PO TABS: 10.0000 mg | ORAL_TABLET | Freq: Every day | ORAL | Status: DC

## 2015-12-03 ENCOUNTER — Other Ambulatory Visit: Payer: Self-pay | Admitting: *Deleted

## 2015-12-03 MED ORDER — RALOXIFENE HCL 60 MG PO TABS: 60.0000 mg | ORAL_TABLET | Freq: Every day | ORAL | Status: DC

## 2015-12-14 ENCOUNTER — Other Ambulatory Visit: Payer: Self-pay | Admitting: Allergy and Immunology

## 2015-12-17 ENCOUNTER — Ambulatory Visit (INDEPENDENT_AMBULATORY_CARE_PROVIDER_SITE_OTHER): Payer: BLUE CROSS/BLUE SHIELD | Admitting: Family Medicine

## 2015-12-17 ENCOUNTER — Encounter: Payer: Self-pay | Admitting: Family Medicine

## 2015-12-17 VITALS — BP 122/82 | Temp 98.3°F | Ht 67.0 in | Wt 219.0 lb

## 2015-12-17 DIAGNOSIS — J441 Chronic obstructive pulmonary disease with (acute) exacerbation: Secondary | ICD-10-CM

## 2015-12-17 DIAGNOSIS — G44229 Chronic tension-type headache, not intractable: Secondary | ICD-10-CM | POA: Diagnosis not present

## 2015-12-17 DIAGNOSIS — J329 Chronic sinusitis, unspecified: Secondary | ICD-10-CM

## 2015-12-17 DIAGNOSIS — J45901 Unspecified asthma with (acute) exacerbation: Secondary | ICD-10-CM | POA: Diagnosis not present

## 2015-12-17 MED ORDER — PHENTERMINE HCL 37.5 MG PO CAPS: 37.5000 mg | ORAL_CAPSULE | ORAL | Status: DC

## 2015-12-17 MED ORDER — CHLORZOXAZONE 500 MG PO TABS: 500.0000 mg | ORAL_TABLET | Freq: Three times a day (TID) | ORAL | Status: DC | PRN

## 2015-12-17 MED ORDER — QVAR 80 MCG/ACT IN AERS: 2.0000 | INHALATION_SPRAY | Freq: Two times a day (BID) | RESPIRATORY_TRACT | Status: DC

## 2015-12-17 MED ORDER — AMOXICILLIN-POT CLAVULANATE 875-125 MG PO TABS: 1.0000 | ORAL_TABLET | Freq: Two times a day (BID) | ORAL | Status: AC

## 2015-12-17 NOTE — Progress Notes (Signed)
   Subjective:    Patient ID: Brenda [name], female    DOB: 1971/02/26, 45 y.o.   MRN: [id number] Patient arrives office with numerous separate concerns. Cough This is a new problem. The current episode started in the past 7 days. Associated symptoms include headaches, nasal congestion, a sore throat and wheezing. Treatments tried: alka setzer plus.   Having mild sinus headaches at times, progressive over the past week.   neck has been hurting along with the back part of her head. On further history this been going on for a couple months. Off-and-on. Last for several hours. Over-the-counter meds helped some but not a lot. Admits a lot more stress. Recently had surgery had abnormal sleep position for quite a few weeks which seemed to add to it.  Now sustantial cong and crustines  No sig chest symt yet   No fever,  Takes Qvar. Claims compliance. Does not miss a dose. Generally when having no infection notes that wheezing is under good control. Medications are reviewed today.  Patient is concerned about progressive obesity. Was on a prior weight loss medication from [place]. Patient would like to resume it. States it deftly helps. No obvious side effects. Admits to some dietary noncompliance   faithfully    Review of Systems  HENT: Positive for sore throat.   Respiratory: Positive for cough and wheezing.   Neurological: Positive for headaches.       Objective:   Physical Exam   alert no acute distress lungs clear. Heart rare rhythm H&T moderate his congestion frontal tenderness. Neck supple. Some pain with lateral rotation. Lungs clear no wheezes currently heart regular in rhythm. BMI over 30      Assessment & Plan:  Impression 1 asthma clinically stable discussed maintain Qvar No. 2 tension headaches discussed 6 weeks duration likely due to sleep position along with increased stress local measures discussed plus medicines. #3 acute rhinosinusitis discussed #4 morbid obesity  discussed patient lives resume meds side effects benefits discussed plan phentermine reinitiated. Antibiotics prescribed. Patient to maintain Qvar. Diet exercise discussed. Albuterol when necessary. Recheck in several months if wishes to stay on medication

## 2016-02-24 ENCOUNTER — Telehealth: Payer: Self-pay | Admitting: Family Medicine

## 2016-02-24 NOTE — Telephone Encounter (Signed)
With the season change, patient says her cough is back and head congestion.  She wants to know if we can send the Rx for the antibiotic and steroid that she usually is prescribed.  She says Dr. Richardson Landry usually just fills this for her because she gets this routinely twice a year.   [facility]

## 2016-02-24 NOTE — Telephone Encounter (Signed)
With patient's hx of asthma and current wheezing recommend office visit for evaluation. Patient verbalized understanding and scheduled office visit

## 2016-02-24 NOTE — Telephone Encounter (Signed)
Good rec

## 2016-02-25 ENCOUNTER — Ambulatory Visit (INDEPENDENT_AMBULATORY_CARE_PROVIDER_SITE_OTHER): Payer: BLUE CROSS/BLUE SHIELD | Admitting: Family Medicine

## 2016-02-25 ENCOUNTER — Encounter: Payer: Self-pay | Admitting: Family Medicine

## 2016-02-25 ENCOUNTER — Other Ambulatory Visit: Payer: Self-pay | Admitting: Allergy and Immunology

## 2016-02-25 ENCOUNTER — Ambulatory Visit (HOSPITAL_COMMUNITY)
Admission: RE | Admit: 2016-02-25 | Discharge: 2016-02-25 | Disposition: A | Discharge status: Home / Self Care | Payer: BLUE CROSS/BLUE SHIELD | Source: Ambulatory Visit | Attending: Family Medicine | Admitting: Family Medicine

## 2016-02-25 VITALS — BP 122/82 | Temp 98.0°F | Ht 67.0 in | Wt 208.8 lb

## 2016-02-25 DIAGNOSIS — J4520 Mild intermittent asthma, uncomplicated: Secondary | ICD-10-CM | POA: Insufficient documentation

## 2016-02-25 DIAGNOSIS — B9689 Other specified bacterial agents as the cause of diseases classified elsewhere: Secondary | ICD-10-CM

## 2016-02-25 DIAGNOSIS — J019 Acute sinusitis, unspecified: Secondary | ICD-10-CM

## 2016-02-25 DIAGNOSIS — R05 Cough: Secondary | ICD-10-CM | POA: Diagnosis present

## 2016-02-25 MED ORDER — PREDNISONE 20 MG PO TABS: ORAL_TABLET | ORAL | Status: DC

## 2016-02-25 MED ORDER — LEVOFLOXACIN 500 MG PO TABS: 500.0000 mg | ORAL_TABLET | Freq: Every day | ORAL | Status: DC

## 2016-02-25 NOTE — Progress Notes (Signed)
   Subjective:    Patient ID: Brenda [name], female    DOB: 15-Jun-1971, 45 y.o.   MRN: [id number]  Cough This is a new problem. The current episode started 1 to 4 weeks ago. Associated symptoms include nasal congestion and wheezing. Treatments tried: augmentin.   Patient with significant head congestion drainage coughing wheezing not feeling good denies vomiting or diarrhea just states low energy. Symptoms been going on for several days following a viral like illness about a week and half ago   Review of Systems  Respiratory: Positive for cough and wheezing.   Skin warm dry not respiratory distress patient denies vomiting diarrhea     Objective:   Physical Exam Moderate sinus tenderness throat is normal neck supple lungs are clear no crackles heart regular       Assessment & Plan:  Sinusitis antibiotics prescribed Some wheezing exposure to smoke in the past significant asthma issues chest x-ray Pulmonary function test recommended 3-4 weeks Warning signs discussed follow-up if problems

## 2016-03-06 ENCOUNTER — Ambulatory Visit (HOSPITAL_BASED_OUTPATIENT_CLINIC_OR_DEPARTMENT_OTHER): Payer: BLUE CROSS/BLUE SHIELD | Admitting: Hematology

## 2016-03-06 ENCOUNTER — Other Ambulatory Visit (HOSPITAL_BASED_OUTPATIENT_CLINIC_OR_DEPARTMENT_OTHER): Payer: BLUE CROSS/BLUE SHIELD

## 2016-03-06 ENCOUNTER — Telehealth: Payer: Self-pay | Admitting: Hematology

## 2016-03-06 ENCOUNTER — Encounter: Payer: Self-pay | Admitting: Hematology

## 2016-03-06 ENCOUNTER — Other Ambulatory Visit: Payer: Self-pay | Admitting: Nurse Practitioner

## 2016-03-06 VITALS — BP 139/78 | HR 82 | Temp 98.3°F | Resp 17 | Ht 67.0 in | Wt 207.3 lb

## 2016-03-06 DIAGNOSIS — N951 Menopausal and female climacteric states: Secondary | ICD-10-CM

## 2016-03-06 DIAGNOSIS — K219 Gastro-esophageal reflux disease without esophagitis: Secondary | ICD-10-CM

## 2016-03-06 DIAGNOSIS — N6091 Unspecified benign mammary dysplasia of right breast: Secondary | ICD-10-CM

## 2016-03-06 DIAGNOSIS — Z1231 Encounter for screening mammogram for malignant neoplasm of breast: Secondary | ICD-10-CM

## 2016-03-06 DIAGNOSIS — N62 Hypertrophy of breast: Secondary | ICD-10-CM

## 2016-03-06 DIAGNOSIS — N6092 Unspecified benign mammary dysplasia of left breast: Principal | ICD-10-CM

## 2016-03-06 DIAGNOSIS — F419 Anxiety disorder, unspecified: Secondary | ICD-10-CM | POA: Diagnosis not present

## 2016-03-06 DIAGNOSIS — F329 Major depressive disorder, single episode, unspecified: Secondary | ICD-10-CM

## 2016-03-06 LAB — CBC WITH DIFFERENTIAL/PLATELET
BASO%: 0.6 % (ref 0.0–2.0)
Basophils Absolute: 0.1 10*3/uL (ref 0.0–0.1)
EOS%: 7.4 % — ABNORMAL HIGH (ref 0.0–7.0)
Eosinophils Absolute: 0.7 10*3/uL — ABNORMAL HIGH (ref 0.0–0.5)
HCT: 40.3 % (ref 34.8–46.6)
HGB: 13.2 g/dL (ref 11.6–15.9)
LYMPH%: 27 % (ref 14.0–49.7)
MCH: 27.1 pg (ref 25.1–34.0)
MCHC: 32.8 g/dL (ref 31.5–36.0)
MCV: 82.8 fL (ref 79.5–101.0)
MONO#: 0.7 10*3/uL (ref 0.1–0.9)
MONO%: 6.9 % (ref 0.0–14.0)
NEUT#: 5.5 10*3/uL (ref 1.5–6.5)
NEUT%: 58.1 % (ref 38.4–76.8)
Platelets: 338 10*3/uL (ref 145–400)
RBC: 4.87 10*6/uL (ref 3.70–5.45)
RDW: 14.8 % — ABNORMAL HIGH (ref 11.2–14.5)
WBC: 9.6 10*3/uL (ref 3.9–10.3)
lymph#: 2.6 10*3/uL (ref 0.9–3.3)

## 2016-03-06 LAB — COMPREHENSIVE METABOLIC PANEL
ALT: 15 U/L (ref 0–55)
AST: 16 U/L (ref 5–34)
Albumin: 3.4 g/dL — ABNORMAL LOW (ref 3.5–5.0)
Alkaline Phosphatase: 65 U/L (ref 40–150)
Anion Gap: 5 mEq/L (ref 3–11)
BUN: 12.1 mg/dL (ref 7.0–26.0)
CO2: 29 mEq/L (ref 22–29)
Calcium: 9.1 mg/dL (ref 8.4–10.4)
Chloride: 105 mEq/L (ref 98–109)
Creatinine: 0.9 mg/dL (ref 0.6–1.1)
EGFR: 82 mL/min/{1.73_m2} — ABNORMAL LOW (ref >90)
Glucose: 93 mg/dl (ref 70–140)
Potassium: 4.3 mEq/L (ref 3.5–5.1)
Sodium: 139 mEq/L (ref 136–145)
Total Bilirubin: 0.53 mg/dL (ref 0.20–1.20)
Total Protein: 6.8 g/dL (ref 6.4–8.3)

## 2016-03-06 MED ORDER — RALOXIFENE HCL 60 MG PO TABS: 60.0000 mg | ORAL_TABLET | Freq: Every day | ORAL | Status: DC

## 2016-03-06 NOTE — Progress Notes (Signed)
[place]  [phone] Fax:([phone]) [phone]  Clinic Follow up Note   Patient Care Team: [name], MD as PCP - General [name], MD as Consulting Physician (Plastic Surgery) 03/06/2016   CHIEF COMPLAINTS:  Follow up right breast atypical lobular hyperplasia (ALH)  HISTORY OF PRESENTING ILLNESS:  Brenda [name] 45 y.o. female is here because of her recently diagnosed [place]. She is accompanied by her mother to the clinic today. She is being referred by her surgeon Dr. Towanda Malkin.   She had history of right breast atypical lobular hyperplasia in 2012. The was discovered by her screening mammogram, and she subsequently underwent right lumpectomy by Dr. Donne Hazel. She was referred to medical oncologist Dr. Humphrey Rolls, who recommended tamoxifen. She did take it for one and half months, but stopped it due to depression, mood swings, moderate hot flush, she stated " it made me crzay", and she did not follow up with Dr. Humphrey Rolls afterwards.. She has been doing screening mammogram annually since then, which has been normal.   She decided to have bilateral breast reduction a few months ago. This was done by Dr. Towanda Malkin on 08/30/2015. She tolerated the surgery well, continue tube was removed a few weeks ago. She still has moderate tenderness at the surgical sites, no other complaints. She otherwise feels well. Her surgical pathology reviewed right breast atypical lobular hyperplasia. And she was referred by Dr. Towanda Malkin to discuss chemoprevention.  CURRENT THERAPY: Raloxifene 60 mg once daily, started on  10/05/2015   INTERIM HISTORY:  Brenda [name] returns for follow-up. She is doing well overall. She has been compliant with Raloxifen and tolerats well. She has occasional mild hot flash, tolerable. No other noticeable side effects. She has been trying to lose weight, she is on phentermine, and has lost about 40 pounds. She is also watching her diet and exercise more regularly. No  other complaints.  MEDICAL HISTORY:  Past Medical History  Diagnosis Date  . Asthma   . Seasonal allergies   . GERD (gastroesophageal reflux disease)     SURGICAL HISTORY: Past Surgical History  Procedure Laterality Date  . Back surgery      3-4 yrs ago.  . Bladder surgery      incontinence  . Egd/ed      5 yr ago by Dr. Laural Golden  . Abdominal hysterectomy    . Breast reduction surgery Bilateral 08/30/2015    Procedure: BREAST REDUCTION WITH LIPOSUCTION;  Surgeon: [name], MD;  Location: [facility];  Service: Plastics;  Laterality: Bilateral;   GYN HISTORY  Menarchal: 9 LMP: hysterectomy at age of 52  Contraceptive:  HRT: n/a  G3P2, one misscarage     SOCIAL HISTORY: Social History   Social History  . Marital Status: Married    Spouse Name: N/A  . Number of Children: 3   . Years of Education: N/A   Occupational History  . Not on file.   Social History Main Topics  . Smoking status: Never Smoker   . Smokeless tobacco: Not on file  . Alcohol Use: No  . Drug Use: No  . Sexual Activity: Not on file   Other Topics Concern  . Not on file   Social History Narrative    FAMILY HISTORY: Family History  Problem Relation Age of Onset  . Cancer Mother 98    endometrial cancer and cervical cancer   . Cancer Maternal Grandmother     prostate cancer    ALLERGIES:  is  allergic to other; adhesive; and latex.  MEDICATIONS:  Current Outpatient Prescriptions  Medication Sig Dispense Refill  . albuterol (PROVENTIL HFA;VENTOLIN HFA) 108 (90 BASE) MCG/ACT inhaler Inhale 2 puffs into the lungs every 6 (six) hours as needed for wheezing or shortness of breath. 1 Inhaler 2  . cetirizine (ZYRTEC) 10 MG tablet Take 10 mg by mouth as needed for allergies.    . chlorzoxazone (PARAFON FORTE DSC) 500 MG tablet Take 1 tablet (500 mg total) by mouth 3 (three) times daily as needed for muscle spasms. 30 tablet 0  . escitalopram (LEXAPRO) 10 MG tablet Take 1  tablet (10 mg total) by mouth daily. 90 tablet 1  . esomeprazole (NEXIUM) 40 MG capsule Take 40 mg by mouth daily at 12 noon.    . fluticasone (FLONASE) 50 MCG/ACT nasal spray Place 2 sprays into both nostrils daily. Prn congestion 16 g 5  . ibuprofen (ADVIL,MOTRIN) 200 MG tablet Take 600 mg by mouth as needed.    Marland Kitchen QVAR 80 MCG/ACT inhaler Inhale 2 puffs into the lungs 2 (two) times daily. 1 Inhaler 5  . raloxifene (EVISTA) 60 MG tablet Take 1 tablet (60 mg total) by mouth daily. 90 tablet 3  . phentermine 37.5 MG capsule Take 1 capsule (37.5 mg total) by mouth every morning. (Patient not taking: Reported on 03/06/2016) 30 capsule 2   No current facility-administered medications for this visit.    REVIEW OF SYSTEMS:   Constitutional: Denies fevers, chills or abnormal night sweats Eyes: Denies blurriness of vision, double vision or watery eyes Ears, nose, mouth, throat, and face: Denies mucositis or sore throat Respiratory: Denies cough, dyspnea or wheezes Cardiovascular: Denies palpitation, chest discomfort or lower extremity swelling Gastrointestinal:  Denies nausea, heartburn or change in bowel habits Skin: Denies abnormal skin rashes Lymphatics: Denies new lymphadenopathy or easy bruising Neurological:Denies numbness, tingling or new weaknesses Behavioral/Psych: Mood is stable, no new changes  All other systems were reviewed with the patient and are negative.  PHYSICAL EXAMINATION: ECOG PERFORMANCE STATUS: 1 - Symptomatic but completely ambulatory  Filed Vitals:   03/06/16 1032  BP: 139/78  Pulse: 82  Temp: 98.3 F (36.8 C)  Resp: 17   Filed Weights   03/06/16 1032  Weight: 207 lb 4.8 oz (94.031 kg)    GENERAL:alert, no distress and comfortable SKIN: skin color, texture, turgor are normal, no rashes or significant lesions EYES: normal, conjunctiva are pink and non-injected, sclera clear OROPHARYNX:no exudate, no erythema and lips, buccal mucosa, and tongue normal  NECK:  supple, thyroid normal size, non-tender, without nodularity LYMPH:  no palpable lymphadenopathy in the cervical, axillary or inguinal LUNGS: clear to auscultation and percussion with normal breathing effort HEART: regular rate & rhythm and no murmurs and no lower extremity edema ABDOMEN:abdomen soft, non-tender and normal bowel sounds Musculoskeletal:no cyanosis of digits and no clubbing  PSYCH: alert & oriented x 3 with fluent speech NEURO: no focal motor/sensory deficits Breasts: Breast inspection showed them to be symmetrical with no nipple discharge. B/l surgical scars are well healed. Palpation of the breasts and axilla revealed no obvious mass that I could appreciate. No skin changes.   LABORATORY DATA:  I have reviewed the data as listed Lab Results  Component Value Date   WBC 9.6 03/06/2016   HGB 13.2 03/06/2016   HCT 40.3 03/06/2016   MCV 82.8 03/06/2016   PLT 338 03/06/2016    Recent Labs  04/26/15 0947 11/04/15 1408  NA 140 143  K 4.5 4.5  CL 101  --   CO2 24 27  GLUCOSE 92 97  BUN 9 11.3  CREATININE 0.78 0.9  CALCIUM 9.1 9.4  GFRNONAA 92  --   GFRAA 106  --   PROT 6.5 7.1  ALBUMIN 4.0 3.5  AST 25 16  ALT 35* 11  ALKPHOS 91 87  BILITOT 0.4 0.37  BILIDIR 0.11  --    PATHOLOGY REPORT Diagnosis 08/30/2015 1. Breast, Mammoplasty, Left - FIBROCYSTIC CHANGES WITH ADENOSIS AND CALCIFICATIONS. - PSEUDOANGIOMATOUS STROMAL HYPERPLASIA ([place]). - THERE IS NO EVIDENCE OF MALIGNANCY. 2. Breast, Mammoplasty, Right - LOBULAR NEOPLASIA (ATYPICAL LOBULAR HYPERPLASIA). - FIBROCYST CHANGES WITH ADENOSIS AND CALCIFICATIONS. - PSEUDOANGIOMATOUS STROMAL HYPERPLASIA ([place]). - SEE COMMENT.  RADIOGRAPHIC STUDIES:  I have personally reviewed the radiological images as listed and agreed with the findings in the report. Dg Chest 2 View  02/25/2016  CLINICAL DATA:  Cough and congestion for 2 weeks EXAM: CHEST  2 VIEW COMPARISON:  April 01, 2014 FINDINGS: There is no edema or  consolidation. The heart size and pulmonary vascularity are normal. No adenopathy. There are surgical clips in the right breast region. IMPRESSION: No edema or consolidation. Electronically Signed   By: Lowella Grip III M.D.   On: 02/25/2016 09:39    ASSESSMENT & PLAN:  45 year old premenopausal Caucasian female  #1 Right breast atypical lobular hyperplasia in 2012 and 2016  -I previously discussed her imaging findings and surgical pathology results with her in great details. -We reviewed the natural history of atypical hyperplasia. It is consider a benign breast disease, however it does increase the risk of breast cancer by 3-5 fold. It is considered as a high risk for breast cancer. -We also reviewed other risk factors for breast cancer. She does not have family history of breast or ovary cancer, or significant as the risks except obesity. -We discussed annual screening mammogram, which will detect early stage breast cancer. She agrees to continue. She is very compliant on screening. Due to the high risk of breast cancer and dense breast tissue (c), we can also consider screening breast MRI once a year, if her insurance covers it. -We also discussed healthy diet and regular exercise, calcium and vitamin D supplement, to reduce her risk of breast cancer -She is on chemotherapy prevention with raloxifene, tolerating well, we'll continue, plan for total 5 years -We'll continue annual screening mammogram. She is due in May.  #2 Bone health -she never had bone density scan  -She is on vitamins D and calcium supplement -I discussed raloxifene can increased her bone density.  #3. Hot flush  -continue Lexapro 10 mg daily -He has improved lately  #4. Anxiety and depression, GERD -She'll continue follow-up with her primary care physician  Plan -continue Raloxifene, I refilled for her today -I plan to see her back in 4 months for follow up. We'll follow up every 6 months after next  visit   All questions were answered. The patient knows to call the clinic with any problems, questions or concerns. I spent 20 minutes counseling the patient face to face. The total time spent in the appointment was 25 minutes and more than 50% was on counseling.     Truitt Merle, MD 03/06/2016 10:46 AM

## 2016-03-06 NOTE — Telephone Encounter (Signed)
Gave and printed appt sched and avs for pt for Aug °

## 2016-04-10 ENCOUNTER — Emergency Department (HOSPITAL_COMMUNITY)
Admission: EM | Admit: 2016-04-10 | Discharge: 2016-04-10 | Disposition: A | Discharge status: Home / Self Care | Payer: BLUE CROSS/BLUE SHIELD | Attending: Emergency Medicine | Admitting: Emergency Medicine

## 2016-04-10 ENCOUNTER — Encounter (HOSPITAL_COMMUNITY): Payer: Self-pay | Admitting: *Deleted

## 2016-04-10 DIAGNOSIS — R42 Dizziness and giddiness: Secondary | ICD-10-CM | POA: Diagnosis not present

## 2016-04-10 DIAGNOSIS — K219 Gastro-esophageal reflux disease without esophagitis: Secondary | ICD-10-CM | POA: Insufficient documentation

## 2016-04-10 DIAGNOSIS — J45909 Unspecified asthma, uncomplicated: Secondary | ICD-10-CM | POA: Insufficient documentation

## 2016-04-10 DIAGNOSIS — Z91040 Latex allergy status: Secondary | ICD-10-CM | POA: Insufficient documentation

## 2016-04-10 DIAGNOSIS — K5900 Constipation, unspecified: Secondary | ICD-10-CM | POA: Insufficient documentation

## 2016-04-10 DIAGNOSIS — R200 Anesthesia of skin: Secondary | ICD-10-CM | POA: Insufficient documentation

## 2016-04-10 DIAGNOSIS — Z79899 Other long term (current) drug therapy: Secondary | ICD-10-CM | POA: Insufficient documentation

## 2016-04-10 DIAGNOSIS — M542 Cervicalgia: Secondary | ICD-10-CM | POA: Insufficient documentation

## 2016-04-10 DIAGNOSIS — Z7951 Long term (current) use of inhaled steroids: Secondary | ICD-10-CM | POA: Insufficient documentation

## 2016-04-10 LAB — BASIC METABOLIC PANEL
Anion gap: 8 (ref 5–15)
BUN: 10 mg/dL (ref 6–20)
CO2: 26 mmol/L (ref 22–32)
Calcium: 9.1 mg/dL (ref 8.9–10.3)
Chloride: 105 mmol/L (ref 101–111)
Creatinine, Ser: 0.88 mg/dL (ref 0.44–1.00)
GFR calc Af Amer: >60 mL/min (ref >60)
GFR calc non Af Amer: >60 mL/min (ref >60)
Glucose, Bld: 97 mg/dL (ref 65–99)
Potassium: 4.3 mmol/L (ref 3.5–5.1)
Sodium: 139 mmol/L (ref 135–145)

## 2016-04-10 LAB — URINALYSIS, ROUTINE W REFLEX MICROSCOPIC
Bilirubin Urine: NEGATIVE
Glucose, UA: NEGATIVE mg/dL
Hgb urine dipstick: NEGATIVE
Ketones, ur: NEGATIVE mg/dL
Leukocytes, UA: NEGATIVE
Nitrite: NEGATIVE
Protein, ur: NEGATIVE mg/dL
Specific Gravity, Urine: 1.011 (ref 1.005–1.030)
pH: 5.5 (ref 5.0–8.0)

## 2016-04-10 LAB — CBC
HCT: 39.8 % (ref 36.0–46.0)
Hemoglobin: 12.8 g/dL (ref 12.0–15.0)
MCH: 26.8 pg (ref 26.0–34.0)
MCHC: 32.2 g/dL (ref 30.0–36.0)
MCV: 83.3 fL (ref 78.0–100.0)
Platelets: 378 10*3/uL (ref 150–400)
RBC: 4.78 MIL/uL (ref 3.87–5.11)
RDW: 13.8 % (ref 11.5–15.5)
WBC: 9.5 10*3/uL (ref 4.0–10.5)

## 2016-04-10 LAB — I-STAT TROPONIN, ED: Troponin i, poc: 0 ng/mL (ref 0.00–0.08)

## 2016-04-10 MED ORDER — NAPROXEN 500 MG PO TABS: 500.0000 mg | ORAL_TABLET | Freq: Two times a day (BID) | ORAL | Status: DC

## 2016-04-10 MED ORDER — MECLIZINE HCL 25 MG PO TABS: 25.0000 mg | ORAL_TABLET | Freq: Three times a day (TID) | ORAL | Status: DC | PRN

## 2016-04-10 NOTE — ED Notes (Signed)
Neuro exam neg. Per Etta Quill, NP

## 2016-04-10 NOTE — ED Provider Notes (Signed)
CSN: [id number]     Arrival date & time 04/10/16  1133 History  By signing my name below, I, Evelene Croon, attest that this documentation has been prepared under the direction and in the presence of non-physician practitioner, Etta Quill, NP. Electronically Signed: Evelene Croon, Scribe. 04/10/2016. 3:24 PM.    Chief Complaint  Patient presents with  . Near Syncope  . Numbness    The history is provided by the patient. No language interpreter was used.    HPI Comments:  Brenda [name] is a 45 y.o. female who presents to the Emergency Department complaining of intermittent numbness to her LUE x 4 days. She notes occasional episodes of numbness  to the RUE; states L>R. She reports associated dizziness x 4 days. She notes her dizziness is worse with increased movement. She also notes intermittent neck pain. She denies recent injury/fall. No alleviating factors noted. She denies nausea, vomiting, abdominal pain, and CP.    Past Medical History  Diagnosis Date  . Asthma   . Seasonal allergies   . GERD (gastroesophageal reflux disease)    Past Surgical History  Procedure Laterality Date  . Back surgery      3-4 yrs ago.  . Bladder surgery      incontinence  . Egd/ed      5 yr ago by Dr. Laural Golden  . Abdominal hysterectomy    . Breast reduction surgery Bilateral 08/30/2015    Procedure: BREAST REDUCTION WITH LIPOSUCTION;  Surgeon: [name], MD;  Location: [facility];  Service: Plastics;  Laterality: Bilateral;   Family History  Problem Relation Age of Onset  . Cancer Mother 73    endometrial cancer and cervical cancer   . Cancer Maternal Grandmother     prostate cancer   Social History  Substance Use Topics  . Smoking status: Never Smoker   . Smokeless tobacco: None  . Alcohol Use: No     Comment: rarely    OB History    No data available     Review of Systems  Constitutional: Negative for fever and chills.  Respiratory: Negative for shortness of  breath.   Cardiovascular: Negative for chest pain.  Gastrointestinal: Positive for constipation (chronic). Negative for nausea, vomiting, abdominal pain and diarrhea.  Musculoskeletal: Positive for neck pain.  Neurological: Positive for dizziness and numbness.  All other systems reviewed and are negative.   Allergies  Other; Adhesive; and Latex  Home Medications   Prior to Admission medications   Medication Sig Start Date End Date Taking? Authorizing Provider  albuterol (PROVENTIL HFA;VENTOLIN HFA) 108 (90 BASE) MCG/ACT inhaler Inhale 2 puffs into the lungs every 6 (six) hours as needed for wheezing or shortness of breath. 04/01/14   [name], MD  cetirizine (ZYRTEC) 10 MG tablet Take 10 mg by mouth as needed for allergies.    Historical Provider, MD  chlorzoxazone (PARAFON FORTE DSC) 500 MG tablet Take 1 tablet (500 mg total) by mouth 3 (three) times daily as needed for muscle spasms. 12/17/15   [name], MD  escitalopram (LEXAPRO) 10 MG tablet Take 1 tablet (10 mg total) by mouth daily. 11/30/15 11/29/16  [name], MD  esomeprazole (NEXIUM) 40 MG capsule Take 40 mg by mouth daily at 12 noon.    Historical Provider, MD  fluticasone (FLONASE) 50 MCG/ACT nasal spray Place 2 sprays into both nostrils daily. Prn congestion 01/05/15   [name], MD  ibuprofen (ADVIL,MOTRIN) 200 MG tablet Take  600 mg by mouth as needed.    Historical Provider, MD  phentermine 37.5 MG capsule Take 1 capsule (37.5 mg total) by mouth every morning. Patient not taking: Reported on 03/06/2016 12/17/15   [name], MD  QVAR 80 MCG/ACT inhaler Inhale 2 puffs into the lungs 2 (two) times daily. 12/17/15   [name], MD  raloxifene (EVISTA) 60 MG tablet Take 1 tablet (60 mg total) by mouth daily. 03/06/16   Truitt Merle, MD   BP 123/63 mmHg  Pulse 77  Temp(Src) 98.3 F (36.8 C) (Oral)  Resp 16  SpO2 100%  LMP 07/06/2004 Physical Exam  Constitutional: She is oriented to person,  place, and time. She appears well-developed and well-nourished. No distress.  HENT:  Head: Normocephalic and atraumatic.  Eyes: Conjunctivae are normal.  Cardiovascular: Normal rate, regular rhythm and normal heart sounds.   Pulmonary/Chest: Effort normal and breath sounds normal. No respiratory distress.  Abdominal: She exhibits no distension.  Neurological: She is alert and oriented to person, place, and time. She has normal strength. She is not disoriented. No cranial nerve deficit or sensory deficit. She displays a negative Romberg sign. Coordination normal.  No visual field deficits  No horizontal or vertical nystagmus   Skin: Skin is warm and dry.  Psychiatric: She has a normal mood and affect.  Nursing note and vitals reviewed.   ED Course  Procedures   DIAGNOSTIC STUDIES:  Oxygen Saturation is 100% on RA, normal by my interpretation.    COORDINATION OF CARE:  3:17 PM Discussed treatment plan with pt at bedside and pt agreed to plan.  Labs Review Labs Reviewed  BASIC METABOLIC PANEL  CBC  URINALYSIS, ROUTINE W REFLEX MICROSCOPIC (NOT AT [facility])  CBG MONITORING, ED  I-STAT TROPOININ, ED    Imaging Review No results found. I have personally reviewed and evaluated these images and lab results as part of my medical decision-making.  Patient discussed with Dr. Rogene Houston. In light of normal neurologic exam, no imaging indicated at this time. Normal ECG and troponin. MDM   Final diagnoses:  None  Intermittent arm numbness seems to be radicular in origin. Will trial anti-inflammatory. Occasional dizziness. Not orthostatic. Will trial meclizine. Symptomatic care instructions provided. Return precautions discussed. Follow-up with PCP.     I personally performed the services described in this documentation, which was scribed in my presence. The recorded information has been reviewed and is accurate.    Etta Quill, NP 04/10/16 [place],  MD 04/11/16 [phone]

## 2016-04-10 NOTE — ED Notes (Addendum)
Dizzy and numbness x 3-4 days left sided weakness that comes and goes , had a virus last week had problems with abd, yucky feeling and feels like abd is swollen

## 2016-04-10 NOTE — Discharge Instructions (Signed)
Dizziness  Dizziness is a common problem. It is a feeling of unsteadiness or light-headedness. You may feel like you are about to faint. Dizziness can lead to injury if you stumble or fall. Anyone can become dizzy, but dizziness is more common in older adults. This condition can be caused by a number of things, including medicines, dehydration, or illness.  HOME CARE INSTRUCTIONS  Taking these steps may help with your condition:  Eating and Drinking   Drink enough fluid to keep your urine clear or pale yellow. This helps to keep you from becoming dehydrated. Try to drink more clear fluids, such as water.   Do not drink alcohol.   Limit your caffeine intake if directed by your health care provider.   Limit your salt intake if directed by your health care provider.  Activity   Avoid making quick movements.    Rise slowly from chairs and steady yourself until you feel okay.    In the morning, first sit up on the side of the bed. When you feel okay, stand slowly while you hold onto something until you know that your balance is fine.   Move your legs often if you need to stand in one place for a long time. Tighten and relax your muscles in your legs while you are standing.   Do not drive or operate heavy machinery if you feel dizzy.   Avoid bending down if you feel dizzy. Place items in your home so that they are easy for you to reach without leaning over.  Lifestyle   Do not use any tobacco products, including cigarettes, chewing tobacco, or electronic cigarettes. If you need help quitting, ask your health care provider.   Try to reduce your stress level, such as with yoga or meditation. Talk with your health care provider if you need help.  General Instructions   Watch your dizziness for any changes.   Take medicines only as directed by your health care provider. Talk with your health care provider if you think that your dizziness is caused by a medicine that you are taking.   Tell a friend or a family  member that you are feeling dizzy. If he or she notices any changes in your behavior, have this person call your health care provider.   Keep all follow-up visits as directed by your health care provider. This is important.  SEEK MEDICAL CARE IF:   Your dizziness does not go away.   Your dizziness or light-headedness gets worse.   You feel nauseous.   You have reduced hearing.   You have new symptoms.   You are unsteady on your feet or you feel like the room is spinning.  SEEK IMMEDIATE MEDICAL CARE IF:   You vomit or have diarrhea and are unable to eat or drink anything.   You have problems talking, walking, swallowing, or using your arms, hands, or legs.   You feel generally weak.   You are not thinking clearly or you have trouble forming sentences. It may take a friend or family member to notice this.   You have chest pain, abdominal pain, shortness of breath, or sweating.   Your vision changes.   You notice any bleeding.   You have a headache.   You have neck pain or a stiff neck.   You have a fever.     This information is not intended to replace advice given to you by your health care provider. Make sure you discuss any   questions you have with your health care provider.     Document Released: 05/16/2001 Document Revised: 04/06/2015 Document Reviewed: 11/16/2014  Elsevier Interactive Patient Education 2016 [employer] Inc[employer]    Paresthesia  Paresthesia is an abnormal burning or prickling sensation. This sensation is generally felt in the hands, arms, legs, or feet. However, it may occur in any part of the body. Usually, it is not painful. The feeling may be described as:   Tingling or numbness.   Pins and needles.   Skin crawling.   Buzzing.   Limbs falling asleep.   Itching.  Most people experience temporary (transient) paresthesia at some time in their lives. Paresthesia may occur when you breathe too quickly (hyperventilation). It can also occur without any apparent cause. Commonly,  paresthesia occurs when pressure is placed on a nerve. The sensation quickly goes away after the pressure is removed. For some people, however, paresthesia is a long-lasting (chronic) condition that is caused by an underlying disorder. If you continue to have paresthesia, you may need further medical evaluation.  HOME CARE INSTRUCTIONS  Watch your condition for any changes. Taking the following actions may help to lessen any discomfort that you are feeling:   Avoid drinking alcohol.   Try acupuncture or massage to help relieve your symptoms.   Keep all follow-up visits as directed by your health care provider. This is important.  SEEK MEDICAL CARE IF:   You continue to have episodes of paresthesia.   Your burning or prickling feeling gets worse when you walk.   You have pain, cramps, or dizziness.   You develop a rash.  SEEK IMMEDIATE MEDICAL CARE IF:   You feel weak.   You have trouble walking or moving.   You have problems with speech, understanding, or vision.   You feel confused.   You cannot control your bladder or bowel movements.   You have numbness after an injury.   You faint.     This information is not intended to replace advice given to you by your health care provider. Make sure you discuss any questions you have with your health care provider.     Document Released: 11/10/2002 Document Revised: 04/06/2015 Document Reviewed: 11/16/2014  Elsevier Interactive Patient Education 2016 Elsevier Inc.

## 2016-04-10 NOTE — ED Notes (Signed)
Pt reports feeling lightheaded and near syncopal x 4 days. Having intermittent numbness sensation to left side of body and then reports its followed by numbness to entire body. No acute distress noted at triage and ekg done.

## 2016-04-19 ENCOUNTER — Ambulatory Visit (HOSPITAL_COMMUNITY)
Admission: RE | Admit: 2016-04-19 | Discharge: 2016-04-19 | Disposition: A | Discharge status: Home / Self Care | Payer: BLUE CROSS/BLUE SHIELD | Source: Ambulatory Visit | Attending: Family Medicine | Admitting: Family Medicine

## 2016-04-19 DIAGNOSIS — J4520 Mild intermittent asthma, uncomplicated: Secondary | ICD-10-CM | POA: Insufficient documentation

## 2016-04-19 LAB — PULMONARY FUNCTION TEST
DL/VA % pred: 94 %
DL/VA: 4.86 ml/min/mmHg/L
DLCO cor % pred: 84 %
DLCO cor: 23.93 ml/min/mmHg
DLCO unc % pred: 82 %
DLCO unc: 23.48 ml/min/mmHg
FEF 25-75 Post: 4.59 L/sec
FEF 25-75 Pre: 2.67 L/sec
FEF2575-%Change-Post: 71 %
FEF2575-%Pred-Post: 149 %
FEF2575-%Pred-Pre: 86 %
FEV1-%Change-Post: 15 %
FEV1-%Pred-Post: 103 %
FEV1-%Pred-Pre: 90 %
FEV1-Post: 3.3 L
FEV1-Pre: 2.86 L
FEV1FVC-%Change-Post: 11 %
FEV1FVC-%Pred-Pre: 96 %
FEV6-%Change-Post: 3 %
FEV6-%Pred-Post: 97 %
FEV6-%Pred-Pre: 94 %
FEV6-Post: 3.77 L
FEV6-Pre: 3.65 L
FEV6FVC-%Change-Post: 0 %
FEV6FVC-%Pred-Post: 103 %
FEV6FVC-%Pred-Pre: 102 %
FVC-%Change-Post: 3 %
FVC-%Pred-Post: 94 %
FVC-%Pred-Pre: 91 %
FVC-Post: 3.77 L
FVC-Pre: 3.66 L
Post FEV1/FVC ratio: 88 %
Post FEV6/FVC ratio: 100 %
Pre FEV1/FVC ratio: 78 %
Pre FEV6/FVC Ratio: 100 %
RV % pred: 93 %
RV: 1.73 L
TLC % pred: 100 %
TLC: 5.55 L

## 2016-04-19 MED ORDER — ALBUTEROL SULFATE HFA 108 (90 BASE) MCG/ACT IN AERS: 2.0000 | INHALATION_SPRAY | Freq: Four times a day (QID) | RESPIRATORY_TRACT | Status: DC | PRN

## 2016-04-19 MED ORDER — ALBUTEROL SULFATE (2.5 MG/3ML) 0.083% IN NEBU
2.5000 mg | INHALATION_SOLUTION | Freq: Once | RESPIRATORY_TRACT | Status: AC
  Administered 2016-04-19: 2.5 mg via RESPIRATORY_TRACT

## 2016-04-19 NOTE — Addendum Note (Signed)
Addended by: Ofilia Neas R on: 04/19/2016 01:21 PM   Modules accepted: Orders

## 2016-05-16 ENCOUNTER — Other Ambulatory Visit: Payer: Self-pay | Admitting: Hematology

## 2016-05-16 DIAGNOSIS — Z1231 Encounter for screening mammogram for malignant neoplasm of breast: Secondary | ICD-10-CM

## 2016-05-21 ENCOUNTER — Other Ambulatory Visit: Payer: Self-pay | Admitting: Family Medicine

## 2016-06-14 ENCOUNTER — Ambulatory Visit (HOSPITAL_COMMUNITY)
Admission: RE | Admit: 2016-06-14 | Discharge: 2016-06-14 | Disposition: A | Discharge status: Home / Self Care | Payer: BLUE CROSS/BLUE SHIELD | Source: Ambulatory Visit | Attending: Hematology | Admitting: Hematology

## 2016-06-14 DIAGNOSIS — Z1231 Encounter for screening mammogram for malignant neoplasm of breast: Secondary | ICD-10-CM | POA: Diagnosis not present

## 2016-07-12 ENCOUNTER — Ambulatory Visit: Payer: BLUE CROSS/BLUE SHIELD | Admitting: Hematology

## 2016-07-12 ENCOUNTER — Other Ambulatory Visit: Payer: BLUE CROSS/BLUE SHIELD

## 2016-07-12 ENCOUNTER — Telehealth: Payer: Self-pay | Admitting: *Deleted

## 2016-07-12 NOTE — Telephone Encounter (Signed)
"  I just woke up and can't make it in today for my appointment.  Can this be rescheduled to Monday?"  Will notify Dr. Burr Medico.  Advised a scheduler will call with new appointments as ordered by provider.

## 2016-07-12 NOTE — Telephone Encounter (Signed)
Sent schedule message for rescheduling appts to Friday 07/21/16.

## 2016-07-12 NOTE — Progress Notes (Deleted)
[place]  Telephone:(336) [phone] Fax:(336) [phone]  Clinic Follow up Note   Patient Care Team: [name], MD as PCP - General [name], MD as Consulting Physician (Plastic Surgery) 07/12/2016   CHIEF COMPLAINTS:  Follow up right breast atypical lobular hyperplasia [facility])  HISTORY OF PRESENTING ILLNESS:  Brenda [name] 45 y.o. female is here because of her recently diagnosed [place]. She is accompanied by her mother to the clinic today. She is being referred by her surgeon Dr. Towanda Malkin.   She had history of right breast atypical lobular hyperplasia in 2012. The was discovered by her screening mammogram, and she subsequently underwent right lumpectomy by Dr. Donne Hazel. She was referred to medical oncologist Dr. Humphrey Rolls, who recommended tamoxifen. She did take it for one and half months, but stopped it due to depression, mood swings, moderate hot flush, she stated " it made me crzay", and she did not follow up with Dr. Humphrey Rolls afterwards.. She has been doing screening mammogram annually since then, which has been normal.   She decided to have bilateral breast reduction a few months ago. This was done by Dr. Towanda Malkin on 08/30/2015. She tolerated the surgery well, continue tube was removed a few weeks ago. She still has moderate tenderness at the surgical sites, no other complaints. She otherwise feels well. Her surgical pathology reviewed right breast atypical lobular hyperplasia. And she was referred by Dr. Towanda Malkin to discuss chemoprevention.  CURRENT THERAPY: Raloxifene 60 mg once daily, started on  10/05/2015   INTERIM HISTORY:  Allani returns for follow-up. She is doing well overall. She has been compliant with Raloxifen and tolerats well. She has occasional mild hot flash, tolerable. No other noticeable side effects. She has been trying to lose weight, she is on phentermine, and has lost about 40 pounds. She is also watching her diet and exercise more regularly. No  other complaints.  MEDICAL HISTORY:  Past Medical History:  Diagnosis Date  . Asthma   . GERD (gastroesophageal reflux disease)   . Seasonal allergies     SURGICAL HISTORY: Past Surgical History:  Procedure Laterality Date  . ABDOMINAL HYSTERECTOMY    . BACK SURGERY     3-4 yrs ago.  Marland Kitchen BLADDER SURGERY     incontinence  . BREAST REDUCTION SURGERY Bilateral 08/30/2015   Procedure: BREAST REDUCTION WITH LIPOSUCTION;  Surgeon: [name], MD;  Location: [facility];  Service: Plastics;  Laterality: Bilateral;  . EGD/ED     5 yr ago by Dr. Laural Golden   GYN HISTORY  Menarchal: 9 LMP: hysterectomy at age of 83  Contraceptive:  HRT: n/a  G3P2, one misscarage     SOCIAL HISTORY: Social History   Social History  . Marital Status: Married    Spouse Name: N/A  . Number of Children: 3   . Years of Education: N/A   Occupational History  . Not on file.   Social History Main Topics  . Smoking status: Never Smoker   . Smokeless tobacco: Not on file  . Alcohol Use: No  . Drug Use: No  . Sexual Activity: Not on file   Other Topics Concern  . Not on file   Social History Narrative    FAMILY HISTORY: Family History  Problem Relation Age of Onset  . Cancer Mother 81    endometrial cancer and cervical cancer   . Cancer Maternal Grandmother     prostate cancer    ALLERGIES:  is allergic to other; adhesive [  tape]; and latex.  MEDICATIONS:  Current Outpatient Prescriptions  Medication Sig Dispense Refill  . albuterol (PROVENTIL HFA;VENTOLIN HFA) 108 (90 Base) MCG/ACT inhaler Inhale 2 puffs into the lungs every 6 (six) hours as needed for wheezing or shortness of breath. 1 Inhaler 2  . cetirizine (ZYRTEC) 10 MG tablet Take 10 mg by mouth as needed for allergies.    . chlorzoxazone (PARAFON FORTE DSC) 500 MG tablet Take 1 tablet (500 mg total) by mouth 3 (three) times daily as needed for muscle spasms. 30 tablet 0  . escitalopram (LEXAPRO) 10 MG tablet  TAKE 1 TABLET (10 MG TOTAL) BY MOUTH DAILY. 90 tablet 0  . esomeprazole (NEXIUM) 40 MG capsule Take 40 mg by mouth daily at 12 noon.    . fluticasone (FLONASE) 50 MCG/ACT nasal spray Place 2 sprays into both nostrils daily. Prn congestion 16 g 5  . ibuprofen (ADVIL,MOTRIN) 200 MG tablet Take 600 mg by mouth as needed.    . meclizine (ANTIVERT) 25 MG tablet Take 1 tablet (25 mg total) by mouth 3 (three) times daily as needed for dizziness. 12 tablet 0  . naproxen (NAPROSYN) 500 MG tablet Take 1 tablet (500 mg total) by mouth 2 (two) times daily. 20 tablet 0  . phentermine 37.5 MG capsule Take 1 capsule (37.5 mg total) by mouth every morning. (Patient not taking: Reported on 03/06/2016) 30 capsule 2  . QVAR 80 MCG/ACT inhaler Inhale 2 puffs into the lungs 2 (two) times daily. 1 Inhaler 5  . raloxifene (EVISTA) 60 MG tablet Take 1 tablet (60 mg total) by mouth daily. 90 tablet 1   No current facility-administered medications for this visit.     REVIEW OF SYSTEMS:   Constitutional: Denies fevers, chills or abnormal night sweats Eyes: Denies blurriness of vision, double vision or watery eyes Ears, nose, mouth, throat, and face: Denies mucositis or sore throat Respiratory: Denies cough, dyspnea or wheezes Cardiovascular: Denies palpitation, chest discomfort or lower extremity swelling Gastrointestinal:  Denies nausea, heartburn or change in bowel habits Skin: Denies abnormal skin rashes Lymphatics: Denies new lymphadenopathy or easy bruising Neurological:Denies numbness, tingling or new weaknesses Behavioral/Psych: Mood is stable, no new changes  All other systems were reviewed with the patient and are negative.  PHYSICAL EXAMINATION: ECOG PERFORMANCE STATUS: 1 - Symptomatic but completely ambulatory  There were no vitals filed for this visit. There were no vitals filed for this visit.  GENERAL:alert, no distress and comfortable SKIN: skin color, texture, turgor are normal, no rashes or  significant lesions EYES: normal, conjunctiva are pink and non-injected, sclera clear OROPHARYNX:no exudate, no erythema and lips, buccal mucosa, and tongue normal  NECK: supple, thyroid normal size, non-tender, without nodularity LYMPH:  no palpable lymphadenopathy in the cervical, axillary or inguinal LUNGS: clear to auscultation and percussion with normal breathing effort HEART: regular rate & rhythm and no murmurs and no lower extremity edema ABDOMEN:abdomen soft, non-tender and normal bowel sounds Musculoskeletal:no cyanosis of digits and no clubbing  PSYCH: alert & oriented x 3 with fluent speech NEURO: no focal motor/sensory deficits Breasts: Breast inspection showed them to be symmetrical with no nipple discharge. B/l surgical scars are well healed. Palpation of the breasts and axilla revealed no obvious mass that I could appreciate. No skin changes.   LABORATORY DATA:  I have reviewed the data as listed Lab Results  Component Value Date   WBC 9.5 04/10/2016   HGB 12.8 04/10/2016   HCT 39.8 04/10/2016   MCV 83.3 04/10/2016  PLT 378 04/10/2016    Recent Labs  11/04/15 1408 03/06/16 1006 04/10/16 1201  NA 143 139 139  K 4.5 4.3 4.3  CL  --   --  105  CO2 27 29 26   GLUCOSE 97 93 97  BUN 11.3 12.1 10  CREATININE 0.9 0.9 0.88  CALCIUM 9.4 9.1 9.1  GFRNONAA  --   --  >60  GFRAA  --   --  >60  PROT 7.1 6.8  --   ALBUMIN 3.5 3.4*  --   AST 16 16  --   ALT 11 15  --   ALKPHOS 87 65  --   BILITOT 0.37 0.53  --    PATHOLOGY REPORT Diagnosis 08/30/2015 1. Breast, Mammoplasty, Left - FIBROCYSTIC CHANGES WITH ADENOSIS AND CALCIFICATIONS. - PSEUDOANGIOMATOUS STROMAL HYPERPLASIA ([place]). - THERE IS NO EVIDENCE OF MALIGNANCY. 2. Breast, Mammoplasty, Right - LOBULAR NEOPLASIA (ATYPICAL LOBULAR HYPERPLASIA). - FIBROCYST CHANGES WITH ADENOSIS AND CALCIFICATIONS. - PSEUDOANGIOMATOUS STROMAL HYPERPLASIA ([place]). - SEE COMMENT.  RADIOGRAPHIC STUDIES:  I have personally  reviewed the radiological images as listed and agreed with the findings in the report. Mm Screening Breast Tomo Bilateral  Result Date: 06/15/2016 CLINICAL DATA:  Screening. History of bilateral breast reduction mammoplasty in 2016. History of high risk excisional biopsy of the right breast in 2011. EXAM: 2D DIGITAL SCREENING BILATERAL MAMMOGRAM WITH CAD AND ADJUNCT TOMO COMPARISON:  New baseline, post reduction. ACR Breast Density Category c: The breast tissue is heterogeneously dense, which may obscure small masses. FINDINGS: There are no findings suspicious for malignancy. Images were processed with CAD. IMPRESSION: No mammographic evidence of malignancy. A result letter of this screening mammogram will be mailed directly to the patient. RECOMMENDATION: Screening mammogram in one year. (Code:SM-B-01Y) BI-RADS CATEGORY  1: Negative. Electronically Signed   By: Fidela Salisbury M.D.   On: 06/15/2016 10:24    ASSESSMENT & PLAN:  45 year old premenopausal Caucasian female  #1 Right breast atypical lobular hyperplasia in 2012 and 2016  -I previously discussed her imaging findings and surgical pathology results with her in great details. -We reviewed the natural history of atypical hyperplasia. It is consider a benign breast disease, however it does increase the risk of breast cancer by 3-5 fold. It is considered as a high risk for breast cancer. -We also reviewed other risk factors for breast cancer. She does not have family history of breast or ovary cancer, or significant as the risks except obesity. -We discussed annual screening mammogram, which will detect early stage breast cancer. She agrees to continue. She is very compliant on screening. Due to the high risk of breast cancer and dense breast tissue (c), we can also consider screening breast MRI once a year, if her insurance covers it. -We also discussed healthy diet and regular exercise, calcium and vitamin D supplement, to reduce her risk of  breast cancer -She is on chemotherapy prevention with raloxifene, tolerating well, we'll continue, plan for total 5 years -We'll continue annual screening mammogram. She is due in May.  #2 Bone health -she never had bone density scan  -She is on vitamins D and calcium supplement -I discussed raloxifene can increased her bone density.  #3. Hot flush  -continue Lexapro 10 mg daily -He has improved lately  #4. Anxiety and depression, GERD -She'll continue follow-up with her primary care physician  Plan -continue Raloxifene, I refilled for her today -I plan to see her back in 4 months for follow up. We'll follow up every 6  months after next visit   All questions were answered. The patient knows to call the clinic with any problems, questions or concerns. I spent 20 minutes counseling the patient face to face. The total time spent in the appointment was 25 minutes and more than 50% was on counseling.     Truitt Merle, MD 07/12/2016 7:21 AM

## 2016-07-18 ENCOUNTER — Telehealth: Payer: Self-pay | Admitting: *Deleted

## 2016-07-18 NOTE — Telephone Encounter (Signed)
Per LOS I have scheduled appts. I have called and gave her the appts for Friday. I have also mailed a calender

## 2016-07-21 ENCOUNTER — Ambulatory Visit (HOSPITAL_BASED_OUTPATIENT_CLINIC_OR_DEPARTMENT_OTHER): Payer: BLUE CROSS/BLUE SHIELD | Admitting: Hematology

## 2016-07-21 ENCOUNTER — Other Ambulatory Visit (HOSPITAL_BASED_OUTPATIENT_CLINIC_OR_DEPARTMENT_OTHER): Payer: BLUE CROSS/BLUE SHIELD

## 2016-07-21 ENCOUNTER — Encounter: Payer: Self-pay | Admitting: Hematology

## 2016-07-21 VITALS — BP 124/87 | HR 87 | Temp 98.7°F | Resp 17 | Ht 67.0 in | Wt 222.8 lb

## 2016-07-21 DIAGNOSIS — N6091 Unspecified benign mammary dysplasia of right breast: Secondary | ICD-10-CM

## 2016-07-21 DIAGNOSIS — N62 Hypertrophy of breast: Secondary | ICD-10-CM

## 2016-07-21 DIAGNOSIS — N6092 Unspecified benign mammary dysplasia of left breast: Principal | ICD-10-CM

## 2016-07-21 LAB — CBC WITH DIFFERENTIAL/PLATELET
BASO%: 1.1 % (ref 0.0–2.0)
Basophils Absolute: 0.1 10*3/uL (ref 0.0–0.1)
EOS%: 3.8 % (ref 0.0–7.0)
Eosinophils Absolute: 0.3 10*3/uL (ref 0.0–0.5)
HCT: 37.3 % (ref 34.8–46.6)
HGB: 12.1 g/dL (ref 11.6–15.9)
LYMPH%: 32.2 % (ref 14.0–49.7)
MCH: 26.2 pg (ref 25.1–34.0)
MCHC: 32.5 g/dL (ref 31.5–36.0)
MCV: 80.8 fL (ref 79.5–101.0)
MONO#: 0.5 10*3/uL (ref 0.1–0.9)
MONO%: 5.4 % (ref 0.0–14.0)
NEUT#: 5.1 10*3/uL (ref 1.5–6.5)
NEUT%: 57.5 % (ref 38.4–76.8)
Platelets: 308 10*3/uL (ref 145–400)
RBC: 4.62 10*6/uL (ref 3.70–5.45)
RDW: 14.5 % (ref 11.2–14.5)
WBC: 8.9 10*3/uL (ref 3.9–10.3)
lymph#: 2.9 10*3/uL (ref 0.9–3.3)

## 2016-07-21 LAB — COMPREHENSIVE METABOLIC PANEL
ALT: 16 U/L (ref 0–55)
AST: 19 U/L (ref 5–34)
Albumin: 3.4 g/dL — ABNORMAL LOW (ref 3.5–5.0)
Alkaline Phosphatase: 77 U/L (ref 40–150)
Anion Gap: 8 mEq/L (ref 3–11)
BUN: 9.5 mg/dL (ref 7.0–26.0)
CO2: 25 mEq/L (ref 22–29)
Calcium: 9.1 mg/dL (ref 8.4–10.4)
Chloride: 107 mEq/L (ref 98–109)
Creatinine: 0.8 mg/dL (ref 0.6–1.1)
EGFR: 85 mL/min/{1.73_m2} — ABNORMAL LOW (ref >90)
Glucose: 88 mg/dl (ref 70–140)
Potassium: 4.2 mEq/L (ref 3.5–5.1)
Sodium: 140 mEq/L (ref 136–145)
Total Bilirubin: 0.52 mg/dL (ref 0.20–1.20)
Total Protein: 6.9 g/dL (ref 6.4–8.3)

## 2016-07-21 NOTE — Progress Notes (Signed)
[place]  Telephone:(336) [phone] Fax:(336) [phone]  Clinic Follow up Note   Patient Care Team: [name], MD as PCP - General [name], MD as Consulting Physician (Plastic Surgery) 07/21/2016   CHIEF COMPLAINTS:  Follow up right breast atypical lobular hyperplasia ([facility])  HISTORY OF PRESENTING ILLNESS:  Brenda [name] 45 y.o. female is here because of her recently diagnosed Brenda [name]. She is accompanied by her mother to the clinic today. She is being referred by her surgeon Dr. Towanda Malkin.   She had history of right breast atypical lobular hyperplasia in 2012. The was discovered by her screening mammogram, and she subsequently underwent right lumpectomy by Dr. Donne Hazel. She was referred to medical oncologist Dr. Humphrey Rolls, who recommended tamoxifen. She did take it for one and half months, but stopped it due to depression, mood swings, moderate hot flush, she stated " it made me crzay", and she did not follow up with Dr. Humphrey Rolls afterwards.. She has been doing screening mammogram annually since then, which has been normal.   She decided to have bilateral breast reduction a few months ago. This was done by Dr. Towanda Malkin on 08/30/2015. She tolerated the surgery well, continue tube was removed a few weeks ago. She still has moderate tenderness at the surgical sites, no other complaints. She otherwise feels well. Her surgical pathology reviewed right breast atypical lobular hyperplasia. And she was referred by Dr. Towanda Malkin to discuss chemoprevention.  CURRENT THERAPY: Raloxifene 60 mg once daily, started on  10/05/2015, will stop on 07/22/2016  INTERIM HISTORY:  Brenda [name] returns for follow-up. She is doing well overall. She was last seen by me 6 months ago. She complains of hot flash, some mood swings with depression, insomnia and she is only sleeps 2-3 hours a night some time, and 20 pounds weight gain in the past 6 months. She feels hungry frequently, which she contributes to  her Raloxifen. She states her symptom have become difficult to manage. She is wondering if she can come off the medication.  MEDICAL HISTORY:  Past Medical History:  Diagnosis Date  . Asthma   . GERD (gastroesophageal reflux disease)   . Seasonal allergies     SURGICAL HISTORY: Past Surgical History:  Procedure Laterality Date  . ABDOMINAL HYSTERECTOMY    . BACK SURGERY     3-4 yrs ago.  Marland Kitchen BLADDER SURGERY     incontinence  . BREAST REDUCTION SURGERY Bilateral 08/30/2015   Procedure: BREAST REDUCTION WITH LIPOSUCTION;  Surgeon: [name], MD;  Location: [facility];  Service: Plastics;  Laterality: Bilateral;  . EGD/ED     5 yr ago by Dr. Laural Golden   GYN HISTORY  Menarchal: 9 LMP: hysterectomy at age of 46  Contraceptive:  HRT: n/a  G3P2, one misscarage     SOCIAL HISTORY: Social History   Social History  . Marital Status: Married    Spouse Name: N/A  . Number of Children: 3   . Years of Education: N/A   Occupational History  . Not on file.   Social History Main Topics  . Smoking status: Never Smoker   . Smokeless tobacco: Not on file  . Alcohol Use: No  . Drug Use: No  . Sexual Activity: Not on file   Other Topics Concern  . Not on file   Social History Narrative    FAMILY HISTORY: Family History  Problem Relation Age of Onset  . Cancer Mother 63    endometrial cancer and cervical cancer   .  Cancer Maternal Grandmother     prostate cancer    ALLERGIES:  is allergic to other; adhesive [tape]; and latex.  MEDICATIONS:  Current Outpatient Prescriptions  Medication Sig Dispense Refill  . albuterol (PROVENTIL HFA;VENTOLIN HFA) 108 (90 Base) MCG/ACT inhaler Inhale 2 puffs into the lungs every 6 (six) hours as needed for wheezing or shortness of breath. 1 Inhaler 2  . cetirizine (ZYRTEC) 10 MG tablet Take 10 mg by mouth as needed for allergies.    Marland Kitchen escitalopram (LEXAPRO) 10 MG tablet TAKE 1 TABLET (10 MG TOTAL) BY MOUTH DAILY. 90  tablet 0  . esomeprazole (NEXIUM) 40 MG capsule Take 40 mg by mouth daily at 12 noon.    . fluticasone (FLONASE) 50 MCG/ACT nasal spray Place 2 sprays into both nostrils daily. Prn congestion 16 g 5  . ibuprofen (ADVIL,MOTRIN) 200 MG tablet Take 600 mg by mouth as needed.    Marland Kitchen QVAR 80 MCG/ACT inhaler Inhale 2 puffs into the lungs 2 (two) times daily. 1 Inhaler 5  . raloxifene (EVISTA) 60 MG tablet Take 1 tablet (60 mg total) by mouth daily. 90 tablet 1  . naproxen (NAPROSYN) 500 MG tablet Take 1 tablet (500 mg total) by mouth 2 (two) times daily. (Patient not taking: Reported on 07/21/2016) 20 tablet 0   No current facility-administered medications for this visit.     REVIEW OF SYSTEMS:   Constitutional: Denies fevers, chills or abnormal night sweats Eyes: Denies blurriness of vision, double vision or watery eyes Ears, nose, mouth, throat, and face: Denies mucositis or sore throat Respiratory: Denies cough, dyspnea or wheezes Cardiovascular: Denies palpitation, chest discomfort or lower extremity swelling Gastrointestinal:  Denies nausea, heartburn or change in bowel habits Skin: Denies abnormal skin rashes Lymphatics: Denies new lymphadenopathy or easy bruising Neurological:Denies numbness, tingling or new weaknesses Behavioral/Psych: Mood is stable, no new changes  All other systems were reviewed with the patient and are negative.  PHYSICAL EXAMINATION: ECOG PERFORMANCE STATUS: 1 - Symptomatic but completely ambulatory  Vitals:   07/21/16 1610  BP: 124/87  Pulse: 87  Resp: 17  Temp: 98.7 F (37.1 C)   Filed Weights   07/21/16 1610  Weight: 222 lb 12.8 oz (101.1 kg)    GENERAL:alert, no distress and comfortable SKIN: skin color, texture, turgor are normal, no rashes or significant lesions EYES: normal, conjunctiva are pink and non-injected, sclera clear OROPHARYNX:no exudate, no erythema and lips, buccal mucosa, and tongue normal  NECK: supple, thyroid normal size,  non-tender, without nodularity LYMPH:  no palpable lymphadenopathy in the cervical, axillary or inguinal LUNGS: clear to auscultation and percussion with normal breathing effort HEART: regular rate & rhythm and no murmurs and no lower extremity edema ABDOMEN:abdomen soft, non-tender and normal bowel sounds Musculoskeletal:no cyanosis of digits and no clubbing  PSYCH: alert & oriented x 3 with fluent speech NEURO: no focal motor/sensory deficits Breasts: Breast inspection showed them to be symmetrical with no nipple discharge. B/l surgical scars are well healed. Palpation of the breasts and axilla revealed no obvious mass that I could appreciate. No skin changes.   LABORATORY DATA:  I have reviewed the data as listed Lab Results  Component Value Date   WBC 8.9 07/21/2016   HGB 12.1 07/21/2016   HCT 37.3 07/21/2016   MCV 80.8 07/21/2016   PLT 308 07/21/2016    Recent Labs  11/04/15 1408 03/06/16 1006 04/10/16 1201 07/21/16 1507  NA 143 139 139 140  K 4.5 4.3 4.3 4.2  CL  --   --  105  --   CO2 27 29 26 25   GLUCOSE 97 93 97 88  BUN 11.3 12.1 10 9.5  CREATININE 0.9 0.9 0.88 0.8  CALCIUM 9.4 9.1 9.1 9.1  GFRNONAA  --   --  >60  --   GFRAA  --   --  >60  --   PROT 7.1 6.8  --  6.9  ALBUMIN 3.5 3.4*  --  3.4*  AST 16 16  --  19  ALT 11 15  --  16  ALKPHOS 87 65  --  77  BILITOT 0.37 0.53  --  0.52   PATHOLOGY REPORT Diagnosis 08/30/2015 1. Breast, Mammoplasty, Left - FIBROCYSTIC CHANGES WITH ADENOSIS AND CALCIFICATIONS. - PSEUDOANGIOMATOUS STROMAL HYPERPLASIA ([place]). - THERE IS NO EVIDENCE OF MALIGNANCY. 2. Breast, Mammoplasty, Right - LOBULAR NEOPLASIA (ATYPICAL LOBULAR HYPERPLASIA). - FIBROCYST CHANGES WITH ADENOSIS AND CALCIFICATIONS. - PSEUDOANGIOMATOUS STROMAL HYPERPLASIA ([place]). - SEE COMMENT.  RADIOGRAPHIC STUDIES:  I have personally reviewed the radiological images as listed and agreed with the findings in the report. No results found.  Screening mammogram  bilateral 06/14/2016 IMPRESSION: No mammographic evidence of malignancy. A result letter of this screening mammogram will be mailed directly to the patient.  RECOMMENDATION: Screening mammogram in one year. (Code:SM-B-01Y)  ASSESSMENT & PLAN:  45 year old premenopausal Caucasian female  #1 Right breast atypical lobular hyperplasia in 2012 and 2016  -I previously discussed her imaging findings and surgical pathology results with her in great details. -We reviewed the natural history of atypical hyperplasia. It is consider a benign breast disease, however it does increase the risk of breast cancer by 3-5 fold. It is considered as a high risk for breast cancer. -We also reviewed other risk factors for breast cancer. She does not have family history of breast or ovary cancer, or significant as the risks except obesity. -We discussed annual screening mammogram, which will detect early stage breast cancer. She agrees to continue. She is very compliant on screening. Due to the high risk of breast cancer and dense breast tissue (c), I recommend her to consider screening breast MRI once a year, if her insurance covers it. -We also discussed healthy diet and regular exercise, calcium and vitamin D supplement, to reduce her risk of breast cancer -She has been on chemoprevention Raloxifen for about 10 months now, however she has developed a multiple symptoms, likely related to the side effects, difficult to manage, I recommend her to stop Raloxifen.   #2 Bone health -she never had bone density scan  -She is on vitamins D and calcium supplement -I discussed raloxifene can increased her bone density.  #3. Hot flush  -continue Lexapro 10 mg daily  #4. Anxiety and depression, GERD -She'll continue follow-up with her primary care physician  Plan -Stop Raloxifene due to multiple side effects -I plan to see her back in one year, no lab. She will also follow-up with her primary care physician at least  once a year.   All questions were answered. The patient knows to call the clinic with any problems, questions or concerns. I spent 20 minutes counseling the patient face to face. The total time spent in the appointment was 25 minutes and more than 50% was on counseling.     Truitt Merle, MD 07/21/2016 5:45 PM

## 2016-07-25 ENCOUNTER — Telehealth: Payer: Self-pay | Admitting: Hematology

## 2016-07-25 NOTE — Telephone Encounter (Signed)
APPT PER 07/21/16 LOS CONF WITH PATIENT. 07/25/16

## 2016-08-23 ENCOUNTER — Encounter: Payer: Self-pay | Admitting: Family Medicine

## 2016-08-23 ENCOUNTER — Ambulatory Visit (INDEPENDENT_AMBULATORY_CARE_PROVIDER_SITE_OTHER): Payer: BLUE CROSS/BLUE SHIELD | Admitting: Family Medicine

## 2016-08-23 VITALS — BP 122/74 | Temp 98.6°F | Ht 67.0 in | Wt 217.0 lb

## 2016-08-23 DIAGNOSIS — J019 Acute sinusitis, unspecified: Secondary | ICD-10-CM

## 2016-08-23 DIAGNOSIS — B9689 Other specified bacterial agents as the cause of diseases classified elsewhere: Secondary | ICD-10-CM

## 2016-08-23 DIAGNOSIS — J4520 Mild intermittent asthma, uncomplicated: Secondary | ICD-10-CM

## 2016-08-23 MED ORDER — CEFPROZIL 500 MG PO TABS: 500.0000 mg | ORAL_TABLET | Freq: Two times a day (BID) | ORAL | 0 refills | Status: DC

## 2016-08-23 NOTE — Progress Notes (Signed)
   Subjective:    Patient ID: Brenda [name], female    DOB: 05/02/1971, 45 y.o.   MRN: [id number]  Sinusitis  This is a new problem. Episode onset: 3 weeks. Associated symptoms include congestion, coughing and ear pain. (Wheezing )  three months of symtom cough an d ong and headache  alsaswl tplus not helping  n hunk   q var using an d helps   Also on ster nasalspray  flonase reg  Review of Systems  HENT: Positive for congestion and ear pain.   Respiratory: Positive for cough.        Objective:   Physical Exam Alert, mild malaise. Hydration good Vitals stable. frontal/ maxillary tenderness evident positive nasal congestion. pharynx normal neck supple  lungs clear/ wheezes. heart regular in rhythm        Assessment & Plan:  Impression rhinosinusitis likely post viral, discussed with patient. plan antibiotics prescribed. Questions answered. Symptomatic care discussed. warning signs discussed. WSL Plus element of reactive airways. See prescriptions written

## 2016-08-24 ENCOUNTER — Encounter: Payer: Self-pay | Admitting: Family Medicine

## 2016-08-25 ENCOUNTER — Encounter: Payer: Self-pay | Admitting: Family Medicine

## 2016-09-06 DIAGNOSIS — E669 Obesity, unspecified: Secondary | ICD-10-CM | POA: Diagnosis not present

## 2016-09-06 DIAGNOSIS — F330 Major depressive disorder, recurrent, mild: Secondary | ICD-10-CM | POA: Diagnosis not present

## 2016-09-06 DIAGNOSIS — K219 Gastro-esophageal reflux disease without esophagitis: Secondary | ICD-10-CM | POA: Diagnosis not present

## 2016-09-25 DIAGNOSIS — Z0000 Encounter for general adult medical examination without abnormal findings: Secondary | ICD-10-CM | POA: Diagnosis not present

## 2016-09-27 DIAGNOSIS — Z6831 Body mass index (BMI) 31.0-31.9, adult: Secondary | ICD-10-CM | POA: Diagnosis not present

## 2016-09-27 DIAGNOSIS — E6609 Other obesity due to excess calories: Secondary | ICD-10-CM | POA: Diagnosis not present

## 2016-09-27 DIAGNOSIS — Z0000 Encounter for general adult medical examination without abnormal findings: Secondary | ICD-10-CM | POA: Diagnosis not present

## 2016-09-27 DIAGNOSIS — E782 Mixed hyperlipidemia: Secondary | ICD-10-CM | POA: Diagnosis not present

## 2016-09-27 DIAGNOSIS — K219 Gastro-esophageal reflux disease without esophagitis: Secondary | ICD-10-CM | POA: Diagnosis not present

## 2016-10-23 ENCOUNTER — Other Ambulatory Visit: Payer: Self-pay | Admitting: Family Medicine

## 2016-10-23 MED ORDER — CEFPROZIL 500 MG PO TABS: 500.0000 mg | ORAL_TABLET | Freq: Two times a day (BID) | ORAL | 0 refills | Status: DC

## 2016-10-23 NOTE — Telephone Encounter (Signed)
Ref times one 

## 2016-10-23 NOTE — Telephone Encounter (Signed)
Patient was notified and med sent to pharmacy.

## 2016-10-23 NOTE — Telephone Encounter (Signed)
Patient states that she has a small cough. No congestion, fever, sob, wheezing, sore throat, etc. [facility]

## 2016-10-23 NOTE — Telephone Encounter (Signed)
Patient requesting refill on cefzil 500 for bad cough

## 2016-11-01 DIAGNOSIS — R21 Rash and other nonspecific skin eruption: Secondary | ICD-10-CM | POA: Diagnosis not present

## 2016-11-01 DIAGNOSIS — Z6831 Body mass index (BMI) 31.0-31.9, adult: Secondary | ICD-10-CM | POA: Diagnosis not present

## 2016-11-01 DIAGNOSIS — Z713 Dietary counseling and surveillance: Secondary | ICD-10-CM | POA: Diagnosis not present

## 2016-11-01 DIAGNOSIS — E6609 Other obesity due to excess calories: Secondary | ICD-10-CM | POA: Diagnosis not present

## 2016-11-02 ENCOUNTER — Encounter: Payer: Self-pay | Admitting: Family Medicine

## 2016-11-02 ENCOUNTER — Ambulatory Visit (INDEPENDENT_AMBULATORY_CARE_PROVIDER_SITE_OTHER): Payer: BLUE CROSS/BLUE SHIELD | Admitting: Family Medicine

## 2016-11-02 VITALS — BP 120/72 | Temp 98.3°F | Ht 67.0 in | Wt 202.0 lb

## 2016-11-02 DIAGNOSIS — B9689 Other specified bacterial agents as the cause of diseases classified elsewhere: Secondary | ICD-10-CM

## 2016-11-02 DIAGNOSIS — J45901 Unspecified asthma with (acute) exacerbation: Secondary | ICD-10-CM | POA: Diagnosis not present

## 2016-11-02 DIAGNOSIS — J019 Acute sinusitis, unspecified: Secondary | ICD-10-CM | POA: Diagnosis not present

## 2016-11-02 DIAGNOSIS — J441 Chronic obstructive pulmonary disease with (acute) exacerbation: Secondary | ICD-10-CM

## 2016-11-02 MED ORDER — PREDNISONE 20 MG PO TABS: ORAL_TABLET | ORAL | 0 refills | Status: DC

## 2016-11-02 MED ORDER — ALBUTEROL SULFATE HFA 108 (90 BASE) MCG/ACT IN AERS: 2.0000 | INHALATION_SPRAY | Freq: Four times a day (QID) | RESPIRATORY_TRACT | 0 refills | Status: DC | PRN

## 2016-11-02 MED ORDER — QVAR 80 MCG/ACT IN AERS
2.0000 | INHALATION_SPRAY | Freq: Two times a day (BID) | RESPIRATORY_TRACT | 0 refills | Status: DC
Start: 2016-11-02 — End: 2017-01-24

## 2016-11-02 MED ORDER — LEVOFLOXACIN 500 MG PO TABS: 500.0000 mg | ORAL_TABLET | Freq: Every day | ORAL | 0 refills | Status: DC

## 2016-11-02 NOTE — Progress Notes (Signed)
   Subjective:    Patient ID: Brenda [name], female    DOB: [birth year], 45 y.o.   MRN: [id number]  Sinusitis  This is a new problem. The current episode started 1 to 4 weeks ago. The problem is unchanged. There has been no fever. The pain is moderate. Associated symptoms include congestion, coughing, headaches and shortness of breath. (Wheezing) Treatments tried: otc medicines. The treatment provided no relief.   Patient has no other concerns at this time.  Light green cong an d clear gunkiness  Uses the q var bid  Needs refill   Sig sob and wheezing  On cefzil bid ten d  Change of weather seems to rtrigger allergies    claritin and zyrtec rotates   Also uses reg   Review of Systems  HENT: Positive for congestion.   Respiratory: Positive for cough and shortness of breath.   Neurological: Positive for headaches.       Objective:   Physical Exam  Alert, mild malaise. Hydration good Vitals stable. frontal/ maxillary tenderness evident positive nasal congestion. pharynx normal neck supple  lungs clear/no crackles However there are positive wheezes. heart regular in rhythm       Assessment & Plan:  Impression rhinosinusitis/bronchitis with flare of asthma plan prednisone taper. Change antibiotic. Albuterol 2 sprays 4 times a day. Maintain Qvar rationale discussed. Patient to consider a possible allergy referral and get back with [place] if she wants

## 2016-12-09 ENCOUNTER — Other Ambulatory Visit: Payer: Self-pay | Admitting: Family Medicine

## 2017-01-23 DIAGNOSIS — Z713 Dietary counseling and surveillance: Secondary | ICD-10-CM | POA: Diagnosis not present

## 2017-01-23 DIAGNOSIS — E6609 Other obesity due to excess calories: Secondary | ICD-10-CM | POA: Diagnosis not present

## 2017-01-23 DIAGNOSIS — Z6831 Body mass index (BMI) 31.0-31.9, adult: Secondary | ICD-10-CM | POA: Diagnosis not present

## 2017-01-24 ENCOUNTER — Telehealth: Payer: Self-pay | Admitting: *Deleted

## 2017-01-24 MED ORDER — QVAR 80 MCG/ACT IN AERS: 2.0000 | INHALATION_SPRAY | Freq: Two times a day (BID) | RESPIRATORY_TRACT | 0 refills | Status: DC

## 2017-01-24 NOTE — Telephone Encounter (Signed)
yes

## 2017-01-24 NOTE — Telephone Encounter (Signed)
QVAR HFA inhalers are being discontinued by manufacturer. Replacement  QVAR HFA REDIHALER. Is it Ok to change.

## 2017-01-24 NOTE — Telephone Encounter (Signed)
Prescription sent electronically to pharmacy. 

## 2017-02-25 ENCOUNTER — Other Ambulatory Visit: Payer: Self-pay | Admitting: Family Medicine

## 2017-05-11 ENCOUNTER — Ambulatory Visit (INDEPENDENT_AMBULATORY_CARE_PROVIDER_SITE_OTHER): Payer: BLUE CROSS/BLUE SHIELD | Admitting: Nurse Practitioner

## 2017-05-11 VITALS — Temp 98.6°F | Wt 212.0 lb

## 2017-05-11 DIAGNOSIS — J4531 Mild persistent asthma with (acute) exacerbation: Secondary | ICD-10-CM

## 2017-05-11 DIAGNOSIS — K219 Gastro-esophageal reflux disease without esophagitis: Secondary | ICD-10-CM

## 2017-05-11 DIAGNOSIS — J329 Chronic sinusitis, unspecified: Secondary | ICD-10-CM

## 2017-05-11 MED ORDER — ALBUTEROL SULFATE (2.5 MG/3ML) 0.083% IN NEBU: INHALATION_SOLUTION | RESPIRATORY_TRACT | 0 refills | Status: DC

## 2017-05-11 MED ORDER — AMOXICILLIN-POT CLAVULANATE 875-125 MG PO TABS: 1.0000 | ORAL_TABLET | Freq: Two times a day (BID) | ORAL | 0 refills | Status: DC

## 2017-05-11 MED ORDER — PREDNISONE 20 MG PO TABS: ORAL_TABLET | ORAL | 0 refills | Status: DC

## 2017-05-13 ENCOUNTER — Encounter: Payer: Self-pay | Admitting: Nurse Practitioner

## 2017-05-13 NOTE — Progress Notes (Signed)
Subjective:  Presents for c/o allergies and breathing problems x 2 weeks. No fever, sore throat or headache. Cough worse at night. Using albuterol 3-4 x per day for wheezing which helps about 2-3 hours. Worse in the afternoons. Sits outside in the evenings with her husband. Also worse when exposed to cigarette smoke when she visits her mother. Taking QVAR BID. Has been on Phentermine for about 2 weeks from another provider. No V/D or abdominal pain. Has had flare up of her reflux. Has been off her Nexium.   Objective:   Temp 98.6 F (37 C)   Wt 212 lb (96.2 kg)   LMP 07/06/2004   BMI 33.20 kg/m  NAD. Alert, oriented. TMs clear effusion. Pharynx injected with green PND noted. Neck supple with mild anterior adenopathy. Lungs initially expiratory wheezes noted. No tachypnea. Given albuterol neb treatment. Wheezing resolved with subjective improvement in symptoms.   Assessment:   Problem List Items Addressed This Visit      Digestive   GERD (gastroesophageal reflux disease)    Other Visit Diagnoses    Mild persistent asthma with acute exacerbation    -  Primary   Relevant Medications   predniSONE (DELTASONE) 20 MG tablet   albuterol (PROVENTIL) (2.5 MG/3ML) 0.083% nebulizer solution   Rhinosinusitis       Relevant Medications   amoxicillin-clavulanate (AUGMENTIN) 875-125 MG tablet   predniSONE (DELTASONE) 20 MG tablet       Plan:   Meds ordered this encounter  Medications  . amoxicillin-clavulanate (AUGMENTIN) 875-125 MG tablet    Sig: Take 1 tablet by mouth 2 (two) times daily.    Dispense:  20 tablet    Refill:  0    Order Specific Question:   Supervising Provider    Answer:   [name] [[id number]]  . predniSONE (DELTASONE) 20 MG tablet    Sig: 2 po qd x 5 d    Dispense:  10 tablet    Refill:  0    Order Specific Question:   Supervising Provider    Answer:   [name] [[id number]]  . albuterol (PROVENTIL) (2.5 MG/3ML) 0.083% nebulizer solution    Sig: Use q 4 hrs via  nebulizer prn wheezing    Dispense:  25 vial    Refill:  0    Order Specific Question:   Supervising Provider    Answer:   [name] [[id number]]   Continue QVAR and albuterol as directed. Given samples of OTC nexium. Will refer to GI specialist for recheck on GERD.  Call back next week if no improvement sooner if worse.

## 2017-05-15 ENCOUNTER — Encounter: Payer: Self-pay | Admitting: Family Medicine

## 2017-05-17 ENCOUNTER — Ambulatory Visit (INDEPENDENT_AMBULATORY_CARE_PROVIDER_SITE_OTHER): Payer: BLUE CROSS/BLUE SHIELD | Admitting: Internal Medicine

## 2017-05-21 ENCOUNTER — Encounter (INDEPENDENT_AMBULATORY_CARE_PROVIDER_SITE_OTHER): Payer: Self-pay | Admitting: Internal Medicine

## 2017-05-21 ENCOUNTER — Ambulatory Visit (INDEPENDENT_AMBULATORY_CARE_PROVIDER_SITE_OTHER): Payer: BLUE CROSS/BLUE SHIELD | Admitting: Internal Medicine

## 2017-05-21 ENCOUNTER — Other Ambulatory Visit (INDEPENDENT_AMBULATORY_CARE_PROVIDER_SITE_OTHER): Payer: Self-pay | Admitting: Internal Medicine

## 2017-05-21 VITALS — BP 138/100 | HR 64 | Ht 67.0 in | Wt 211.7 lb

## 2017-05-21 DIAGNOSIS — K219 Gastro-esophageal reflux disease without esophagitis: Secondary | ICD-10-CM

## 2017-05-21 DIAGNOSIS — R1319 Other dysphagia: Secondary | ICD-10-CM

## 2017-05-21 DIAGNOSIS — R1310 Dysphagia, unspecified: Secondary | ICD-10-CM

## 2017-05-21 NOTE — Progress Notes (Signed)
Subjective:    Patient ID: Brenda [name], female    DOB: 1971-12-02, 46 y.o.   MRN: [id number]  HPI Referred by Pearson Forster NP for GERD. She tells me she stays constipated. She is having a BM 1-2 times a week. Here lately she has been having a BM daily. She has been eating salads and vegetables and yogurt for weight loss. She is using Miralax for her constipation. Taking Nexium 20mg  BID for GERD.Has been on Nexium for about a week. She says GERD is better.  She has bloating.  Appetite is somewhat good. She has some nausea. No weight loss.  She says sometimes she has dysphagia to liquids and meats.  Steak and chicken feel like they lodge.   She also says her mother had "cancer cell"  in her stomach.     06/22/2004 DG Esophagus: choking sensation IMPRESSION  Somewhat prominent esophageal ampulla without evidence of definite hiatal hernia or reflux. Slight prominence cricopharyngeus muscle, but no Zenker's diverticulum.   Review of Systems Past Medical History:  Diagnosis Date  . Asthma   . GERD (gastroesophageal reflux disease)   . Seasonal allergies     Past Surgical History:  Procedure Laterality Date  . ABDOMINAL HYSTERECTOMY    . BACK SURGERY     3-4 yrs ago.  Marland Kitchen BLADDER SURGERY     incontinence  . BREAST REDUCTION SURGERY Bilateral 08/30/2015   Procedure: BREAST REDUCTION WITH LIPOSUCTION;  Surgeon: [name], MD;  Location: [facility];  Service: Plastics;  Laterality: Bilateral;  . EGD/ED     5 yr ago by Dr. Laural Golden    Allergies  Allergen Reactions  . Other     Grass ,  Bed mites,   Dogs  And  Cats.   . Adhesive [Tape] Rash  . Latex Rash    Current Outpatient Prescriptions on File Prior to Visit  Medication Sig Dispense Refill  . albuterol (PROVENTIL HFA;VENTOLIN HFA) 108 (90 Base) MCG/ACT inhaler Inhale 2 puffs into the lungs every 6 (six) hours as needed for wheezing or shortness of breath. 1 Inhaler 0  . albuterol (PROVENTIL) (2.5  MG/3ML) 0.083% nebulizer solution Use q 4 hrs via nebulizer prn wheezing 25 vial 0  . amoxicillin-clavulanate (AUGMENTIN) 875-125 MG tablet Take 1 tablet by mouth 2 (two) times daily. 20 tablet 0  . cetirizine (ZYRTEC) 10 MG tablet Take 10 mg by mouth as needed for allergies.    Marland Kitchen escitalopram (LEXAPRO) 10 MG tablet TAKE 1 TABLET (10 MG TOTAL) BY MOUTH DAILY. 90 tablet 0  . fluticasone (FLONASE) 50 MCG/ACT nasal spray Place 2 sprays into both nostrils daily. Prn congestion 16 g 5  . PROAIR HFA 108 (90 Base) MCG/ACT inhaler INHALE 2 PUFFS INTO THE LUNGS EVERY 6 (SIX) HOURS AS NEEDED FOR WHEEZING OR SHORTNESS OF BREATH. 8.5 Inhaler 2  . QVAR 80 MCG/ACT inhaler INHALE 2 PUFFS INTO THE LUNGS 2 (TWO) TIMES DAILY. 26.1 g 0  . ibuprofen (ADVIL,MOTRIN) 200 MG tablet Take 600 mg by mouth as needed.     No current facility-administered medications on file prior to visit.         Objective:   Physical Exam [name] pressure (!) 138/100, pulse 64, height 5\' 7"  (1.702 m), weight 211 lb 11.2 oz (96 kg), last menstrual period 07/06/2004.  Alert and oriented. Skin warm and dry. Oral mucosa is moist.   . Sclera anicteric, conjunctivae is pink. Thyroid not enlarged. No cervical lymphadenopathy. Lungs clear. Heart  regular rate and rhythm.  Abdomen is soft. Bowel sounds are positive. No hepatomegaly. No abdominal masses felt. No tenderness.  No edema to lower extremities.         Assessment & Plan:  Dysphagia: Stricture, WEB needs to be ruled out.  EGD/ED. GERD. Continue the Nexium.

## 2017-05-21 NOTE — Patient Instructions (Signed)
EGD/ED. Continue the Nexium.

## 2017-05-28 ENCOUNTER — Other Ambulatory Visit: Payer: Self-pay | Admitting: Hematology

## 2017-05-28 DIAGNOSIS — Z1231 Encounter for screening mammogram for malignant neoplasm of breast: Secondary | ICD-10-CM

## 2017-05-29 ENCOUNTER — Encounter (INDEPENDENT_AMBULATORY_CARE_PROVIDER_SITE_OTHER): Payer: Self-pay | Admitting: *Deleted

## 2017-05-29 ENCOUNTER — Telehealth (INDEPENDENT_AMBULATORY_CARE_PROVIDER_SITE_OTHER): Payer: Self-pay | Admitting: *Deleted

## 2017-05-29 DIAGNOSIS — K219 Gastro-esophageal reflux disease without esophagitis: Secondary | ICD-10-CM | POA: Insufficient documentation

## 2017-05-29 DIAGNOSIS — R1319 Other dysphagia: Secondary | ICD-10-CM | POA: Insufficient documentation

## 2017-05-29 DIAGNOSIS — R1310 Dysphagia, unspecified: Secondary | ICD-10-CM | POA: Insufficient documentation

## 2017-05-29 NOTE — Telephone Encounter (Signed)
Please call patient, wants recommendations about her constipation  [phone]

## 2017-05-30 ENCOUNTER — Telehealth (INDEPENDENT_AMBULATORY_CARE_PROVIDER_SITE_OTHER): Payer: Self-pay | Admitting: Internal Medicine

## 2017-05-30 DIAGNOSIS — K5909 Other constipation: Secondary | ICD-10-CM

## 2017-05-30 MED ORDER — LUBIPROSTONE 24 MCG PO CAPS: 24.0000 ug | ORAL_CAPSULE | Freq: Two times a day (BID) | ORAL | 3 refills | Status: DC

## 2017-05-30 NOTE — Telephone Encounter (Signed)
Rx for Amitiza sent to her pharmacy for her constipation

## 2017-06-18 ENCOUNTER — Other Ambulatory Visit: Payer: Self-pay | Admitting: Hematology

## 2017-06-18 ENCOUNTER — Telehealth: Payer: Self-pay | Admitting: *Deleted

## 2017-06-18 DIAGNOSIS — N6091 Unspecified benign mammary dysplasia of right breast: Secondary | ICD-10-CM

## 2017-06-18 NOTE — Telephone Encounter (Signed)
Call from pt, she is trying to schedule mammogram. She was told by the [facility] that she needs diagnostic mammogram and ultrasound. They need orders entered.  Next office visit 7/26 with Dr. Burr Medico.

## 2017-06-18 NOTE — Telephone Encounter (Signed)
Orders are in, please let [facility] know. Thanks.   Truitt Merle MD

## 2017-06-19 ENCOUNTER — Other Ambulatory Visit: Payer: Self-pay | Admitting: Hematology

## 2017-06-19 DIAGNOSIS — N6091 Unspecified benign mammary dysplasia of right breast: Secondary | ICD-10-CM

## 2017-06-19 DIAGNOSIS — Z9889 Other specified postprocedural states: Secondary | ICD-10-CM

## 2017-06-19 DIAGNOSIS — N644 Mastodynia: Secondary | ICD-10-CM

## 2017-06-22 ENCOUNTER — Ambulatory Visit: Payer: BLUE CROSS/BLUE SHIELD

## 2017-06-22 ENCOUNTER — Ambulatory Visit
Admission: RE | Admit: 2017-06-22 | Discharge: 2017-06-22 | Disposition: A | Discharge status: Home / Self Care | Payer: BLUE CROSS/BLUE SHIELD | Source: Ambulatory Visit | Attending: Hematology | Admitting: Hematology

## 2017-06-22 DIAGNOSIS — R922 Inconclusive mammogram: Secondary | ICD-10-CM | POA: Diagnosis not present

## 2017-06-22 DIAGNOSIS — Z9889 Other specified postprocedural states: Secondary | ICD-10-CM

## 2017-06-22 DIAGNOSIS — N6314 Unspecified lump in the right breast, lower inner quadrant: Secondary | ICD-10-CM | POA: Diagnosis not present

## 2017-06-22 DIAGNOSIS — N6091 Unspecified benign mammary dysplasia of right breast: Secondary | ICD-10-CM

## 2017-06-22 DIAGNOSIS — N6313 Unspecified lump in the right breast, lower outer quadrant: Secondary | ICD-10-CM | POA: Diagnosis not present

## 2017-06-22 DIAGNOSIS — N644 Mastodynia: Secondary | ICD-10-CM

## 2017-06-28 ENCOUNTER — Encounter: Payer: BLUE CROSS/BLUE SHIELD | Admitting: Hematology

## 2017-06-30 NOTE — Progress Notes (Signed)
This encounter was created in error - please disregard.

## 2017-07-03 ENCOUNTER — Telehealth: Payer: Self-pay

## 2017-07-03 NOTE — Telephone Encounter (Signed)
Called patient to reschedule due to no show and her voicemail is full,a letter and calendar have been mailed to the patient  Brenda [name]

## 2017-07-09 ENCOUNTER — Telehealth: Payer: Self-pay

## 2017-07-09 NOTE — Telephone Encounter (Signed)
Called patient to r/s her 8/29 appt due to bmdc and patient wishes to check her new work schedule and will call us back to r/s  Dezmond Downie

## 2017-08-01 ENCOUNTER — Ambulatory Visit: Payer: BLUE CROSS/BLUE SHIELD | Admitting: Hematology

## 2017-08-06 ENCOUNTER — Other Ambulatory Visit: Payer: Self-pay | Admitting: Family Medicine

## 2017-08-13 ENCOUNTER — Telehealth: Payer: Self-pay | Admitting: Family Medicine

## 2017-08-13 DIAGNOSIS — Z13220 Encounter for screening for lipoid disorders: Secondary | ICD-10-CM

## 2017-08-13 DIAGNOSIS — R5383 Other fatigue: Secondary | ICD-10-CM

## 2017-08-13 DIAGNOSIS — Z79899 Other long term (current) drug therapy: Secondary | ICD-10-CM

## 2017-08-13 NOTE — Telephone Encounter (Signed)
Met 7, lipid, liver, vit D and TSH

## 2017-08-13 NOTE — Telephone Encounter (Signed)
Blood work ordered in EPIC. Patient notified. 

## 2017-08-13 NOTE — Telephone Encounter (Signed)
(  Message for Brenda [name])-patient has physical on 10/22 and needing her labs done.

## 2017-08-23 ENCOUNTER — Telehealth (INDEPENDENT_AMBULATORY_CARE_PROVIDER_SITE_OTHER): Payer: Self-pay | Admitting: *Deleted

## 2017-08-23 NOTE — Telephone Encounter (Signed)
Called patient and scheduled her to see Deberah Castle, NP on 08/29/17 at 11:15am

## 2017-08-23 NOTE — Telephone Encounter (Signed)
Needs OV.  

## 2017-08-23 NOTE — Telephone Encounter (Signed)
Forwarded to [facility] for OV

## 2017-08-23 NOTE — Telephone Encounter (Signed)
Patient left message requesting TCS, having stomach problems -- I didn't see in your note anything about TCS, she is having EGD/ED

## 2017-08-29 ENCOUNTER — Ambulatory Visit (INDEPENDENT_AMBULATORY_CARE_PROVIDER_SITE_OTHER): Payer: BLUE CROSS/BLUE SHIELD | Admitting: Internal Medicine

## 2017-08-29 ENCOUNTER — Encounter (INDEPENDENT_AMBULATORY_CARE_PROVIDER_SITE_OTHER): Payer: Self-pay | Admitting: Internal Medicine

## 2017-08-29 ENCOUNTER — Encounter (INDEPENDENT_AMBULATORY_CARE_PROVIDER_SITE_OTHER): Payer: Self-pay

## 2017-08-29 VITALS — BP 130/80 | HR 72 | Temp 97.4°F | Ht 67.0 in | Wt 225.9 lb

## 2017-08-29 DIAGNOSIS — K5909 Other constipation: Secondary | ICD-10-CM

## 2017-08-29 MED ORDER — LINACLOTIDE 290 MCG PO CAPS: 290.0000 ug | ORAL_CAPSULE | Freq: Every day | ORAL | 3 refills | Status: DC

## 2017-08-29 NOTE — Patient Instructions (Addendum)
Samples of LInzessx 4 boxes OV in 3 months.

## 2017-08-29 NOTE — Progress Notes (Signed)
Subjective:    Patient ID: Brenda [name], female    DOB: October 24, 1971, 46 y.o.   MRN: [id number]  HPI  Presents today with c/o constipation.  She was given Amitiza but really is not satisfied with this.  She was seen in June of this years for GERD. Taking Nexium 20mg  BID. GERD better since starting the Nexium. She also c/o some dysphagia. She is scheduled for an EGD in October.  Hx of stomach cancer in a mother.     06/22/2004 DG Esophagus: choking sensation IMPRESSION  Somewhat prominent esophageal ampulla without evidence of definite hiatal hernia or reflux. Slight prominence cricopharyngeus muscle, but no Zenker's diverticulum.     Review of Systems Past Medical History:  Diagnosis Date  . Asthma   . GERD (gastroesophageal reflux disease)   . Seasonal allergies     Past Surgical History:  Procedure Laterality Date  . ABDOMINAL HYSTERECTOMY    . BACK SURGERY     3-4 yrs ago.  Marland Kitchen BLADDER SURGERY     incontinence  . BREAST REDUCTION SURGERY Bilateral 08/30/2015   Procedure: BREAST REDUCTION WITH LIPOSUCTION;  Surgeon: [name], MD;  Location: [facility];  Service: Plastics;  Laterality: Bilateral;  . EGD/ED     5 yr ago by Dr. Laural Golden  . HIGH RISK BREAST EXCISION Right 2011  . REDUCTION MAMMAPLASTY Bilateral 2016    Allergies  Allergen Reactions  . Other     Grass ,  Bed mites,   Dogs  And  Cats.   . Adhesive [Tape] Rash  . Latex Rash    Current Outpatient Prescriptions on File Prior to Visit  Medication Sig Dispense Refill  . albuterol (PROVENTIL HFA;VENTOLIN HFA) 108 (90 Base) MCG/ACT inhaler Inhale 2 puffs into the lungs every 6 (six) hours as needed for wheezing or shortness of breath. 1 Inhaler 0  . albuterol (PROVENTIL) (2.5 MG/3ML) 0.083% nebulizer solution Use q 4 hrs via nebulizer prn wheezing 25 vial 0  . amoxicillin-clavulanate (AUGMENTIN) 875-125 MG tablet Take 1 tablet by mouth 2 (two) times daily. 20 tablet 0  . cetirizine  (ZYRTEC) 10 MG tablet Take 10 mg by mouth as needed for allergies.    Marland Kitchen escitalopram (LEXAPRO) 10 MG tablet TAKE 1 TABLET (10 MG TOTAL) BY MOUTH DAILY. 90 tablet 0  . esomeprazole (NEXIUM) 20 MG capsule Take 20 mg by mouth 2 (two) times daily before a meal.    . fluticasone (FLONASE) 50 MCG/ACT nasal spray Place 2 sprays into both nostrils daily. Prn congestion 16 g 5  . ibuprofen (ADVIL,MOTRIN) 200 MG tablet Take 600 mg by mouth as needed.    . lubiprostone (AMITIZA) 24 MCG capsule Take 1 capsule (24 mcg total) by mouth 2 (two) times daily with a meal. 60 capsule 3  . PROAIR HFA 108 (90 Base) MCG/ACT inhaler INHALE 2 PUFFS INTO THE LUNGS EVERY 6 (SIX) HOURS AS NEEDED FOR WHEEZING OR SHORTNESS OF BREATH. 8.5 Inhaler 2  . QVAR 80 MCG/ACT inhaler INHALE 2 PUFFS INTO THE LUNGS 2 (TWO) TIMES DAILY. 26.1 g 0   No current facility-administered medications on file prior to visit.         Objective:   Physical Exam [name] pressure 130/80, pulse 72, temperature (!) 97.4 F (36.3 C), height 5\' 7"  (1.702 m), weight 225 lb 14.4 oz (102.5 kg), last menstrual period 07/06/2004. Alert and oriented. Skin warm and dry. Oral mucosa is moist.   . Sclera anicteric, conjunctivae is pink.  Thyroid not enlarged. No cervical lymphadenopathy. Lungs clear. Heart regular rate and rhythm.  Abdomen is soft. Bowel sounds are positive. No hepatomegaly. No abdominal masses felt. No tenderness.  No edema to lower extremities.  .         Assessment & Plan:  Constipation. Am going to try her on Linzess. OV in 3 months.

## 2017-09-05 ENCOUNTER — Encounter (HOSPITAL_COMMUNITY): Payer: Self-pay | Admitting: *Deleted

## 2017-09-05 ENCOUNTER — Ambulatory Visit (HOSPITAL_COMMUNITY)
Admission: RE | Admit: 2017-09-05 | Discharge: 2017-09-05 | Disposition: A | Discharge status: Home / Self Care | Payer: BLUE CROSS/BLUE SHIELD | Source: Ambulatory Visit | Attending: Internal Medicine | Admitting: Internal Medicine

## 2017-09-05 ENCOUNTER — Encounter (HOSPITAL_COMMUNITY)
Admission: RE | Disposition: A | Discharge status: Home / Self Care | Payer: Self-pay | Source: Ambulatory Visit | Attending: Internal Medicine

## 2017-09-05 DIAGNOSIS — K219 Gastro-esophageal reflux disease without esophagitis: Secondary | ICD-10-CM | POA: Diagnosis not present

## 2017-09-05 DIAGNOSIS — Z79899 Other long term (current) drug therapy: Secondary | ICD-10-CM | POA: Diagnosis not present

## 2017-09-05 DIAGNOSIS — J45909 Unspecified asthma, uncomplicated: Secondary | ICD-10-CM | POA: Insufficient documentation

## 2017-09-05 DIAGNOSIS — K449 Diaphragmatic hernia without obstruction or gangrene: Secondary | ICD-10-CM | POA: Diagnosis not present

## 2017-09-05 DIAGNOSIS — R1310 Dysphagia, unspecified: Secondary | ICD-10-CM | POA: Insufficient documentation

## 2017-09-05 DIAGNOSIS — K5909 Other constipation: Secondary | ICD-10-CM | POA: Insufficient documentation

## 2017-09-05 DIAGNOSIS — R1314 Dysphagia, pharyngoesophageal phase: Secondary | ICD-10-CM | POA: Diagnosis not present

## 2017-09-05 DIAGNOSIS — R1319 Other dysphagia: Secondary | ICD-10-CM | POA: Insufficient documentation

## 2017-09-05 DIAGNOSIS — K228 Other specified diseases of esophagus: Secondary | ICD-10-CM | POA: Insufficient documentation

## 2017-09-05 HISTORY — PX: ESOPHAGEAL DILATION: SHX303

## 2017-09-05 HISTORY — PX: ESOPHAGOGASTRODUODENOSCOPY: SHX5428

## 2017-09-05 SURGERY — EGD (ESOPHAGOGASTRODUODENOSCOPY)
Anesthesia: Moderate Sedation

## 2017-09-05 MED ORDER — SODIUM CHLORIDE 0.9 % IV SOLN
INTRAVENOUS | Status: DC
  Administered 2017-09-05: 12:00:00 via INTRAVENOUS

## 2017-09-05 MED ORDER — MIDAZOLAM HCL 5 MG/5ML IJ SOLN
INTRAMUSCULAR | Status: DC | PRN
  Administered 2017-09-05: 2 mg via INTRAVENOUS
  Administered 2017-09-05: 1 mg via INTRAVENOUS
  Administered 2017-09-05: 2 mg via INTRAVENOUS
  Administered 2017-09-05: 1 mg via INTRAVENOUS
  Administered 2017-09-05: 2 mg via INTRAVENOUS
  Administered 2017-09-05: 3 mg via INTRAVENOUS
  Administered 2017-09-05: 1 mg via INTRAVENOUS

## 2017-09-05 MED ORDER — MIDAZOLAM HCL 5 MG/5ML IJ SOLN
INTRAMUSCULAR | Status: AC
  Filled 2017-09-05: qty 10

## 2017-09-05 MED ORDER — PANTOPRAZOLE SODIUM 40 MG PO TBEC: 40.0000 mg | DELAYED_RELEASE_TABLET | Freq: Every day | ORAL | 5 refills | Status: DC

## 2017-09-05 MED ORDER — MEPERIDINE HCL 50 MG/ML IJ SOLN
INTRAMUSCULAR | Status: AC
  Filled 2017-09-05: qty 1

## 2017-09-05 MED ORDER — MIDAZOLAM HCL 5 MG/5ML IJ SOLN
INTRAMUSCULAR | Status: AC
  Filled 2017-09-05: qty 5

## 2017-09-05 MED ORDER — LIDOCAINE VISCOUS 2 % MT SOLN
OROMUCOSAL | Status: AC
  Filled 2017-09-05: qty 15

## 2017-09-05 MED ORDER — MEPERIDINE HCL 50 MG/ML IJ SOLN
INTRAMUSCULAR | Status: DC | PRN
  Administered 2017-09-05 (×4): 25 mg via INTRAVENOUS

## 2017-09-05 NOTE — Discharge Instructions (Signed)
Discontinue Nexium. Begin pantoprazole 40 mg by mouth 30 minutes before breakfast daily. Resume other medications and diet as before. No driving for 24 hours. Physician will call with biopsy results.      Esophagogastroduodenoscopy, Care After Refer to this sheet in the next few weeks. These instructions provide you with information about caring for yourself after your procedure. Your health care provider may also give you more specific instructions. Your treatment has been planned according to current medical practices, but problems sometimes occur. Call your health care provider if you have any problems or questions after your procedure. What can I expect after the procedure? After the procedure, it is common to have:  A sore throat.  Nausea.  Bloating.  Dizziness.  Fatigue.  Follow these instructions at home:  Do not eat or drink anything until the numbing medicine (local anesthetic) has worn off and your gag reflex has returned. You will know that the local anesthetic has worn off when you can swallow comfortably.  Do not drive for 24 hours if you received a medicine to help you relax (sedative).  If your health care provider took a tissue sample for testing during the procedure, make sure to get your test results. This is your responsibility. Ask your health care provider or the department performing the test when your results will be ready.  Keep all follow-up visits as told by your health care provider. This is important. Contact a health care provider if:  You cannot stop coughing.  You are not urinating.  You are urinating less than usual. Get help right away if:  You have trouble swallowing.  You cannot eat or drink.  You have throat or chest pain that gets worse.  You are dizzy or light-headed.  You faint.  You have nausea or vomiting.  You have chills.  You have a fever.  You have severe abdominal pain.  You have black, tarry, or bloody  stools. This information is not intended to replace advice given to you by your health care provider. Make sure you discuss any questions you have with your health care provider. Document Released: 11/06/2012 Document Revised: 04/27/2016 Document Reviewed: 10/14/2015 Elsevier Interactive Patient Education  2018 [employer].   Hiatal Hernia A hiatal hernia occurs when part of the stomach slides above the muscle that separates the abdomen from the chest (diaphragm). A person can be born with a hiatal hernia (congenital), or it may develop over time. In almost all cases of hiatal hernia, only the top part of the stomach pushes through the diaphragm. Many people have a hiatal hernia with no symptoms. The larger the hernia, the more likely it is that you will have symptoms. In some cases, a hiatal hernia allows stomach acid to flow back into the tube that carries food from your mouth to your stomach (esophagus). This may cause heartburn symptoms. Severe heartburn symptoms may mean that you have developed a condition called gastroesophageal reflux disease (GERD). What are the causes? This condition is caused by a weakness in the opening (hiatus) where the esophagus passes through the diaphragm to attach to the upper part of the stomach. A person may be born with a weakness in the hiatus, or a weakness can develop over time. What increases the risk? This condition is more likely to develop in:  Older people. Age is a major risk factor for a hiatal hernia, especially if you are over the age of 57.  Pregnant women.  People who are overweight.  People who have frequent constipation.  What are the signs or symptoms? Symptoms of this condition usually develop in the form of GERD symptoms. Symptoms include:  Heartburn.  Belching.  Indigestion.  Trouble swallowing.  Coughing or wheezing.  Sore throat.  Hoarseness.  Chest pain.  Nausea and vomiting.  How is this diagnosed? This  condition may be diagnosed during testing for GERD. Tests that may be done include:  X-rays of your stomach or chest.  An upper gastrointestinal (GI) series. This is an X-ray exam of your GI tract that is taken after you swallow a chalky liquid that shows up clearly on the X-ray.  Endoscopy. This is a procedure to look into your stomach using a thin, flexible tube that has a tiny camera and light on the end of it.  How is this treated? This condition may be treated by:  Dietary and lifestyle changes to help reduce GERD symptoms.  Medicines. These may include: ? Over-the-counter antacids. ? Medicines that make your stomach empty more quickly. ? Medicines that block the production of stomach acid (H2 blockers). ? Stronger medicines to reduce stomach acid (proton pump inhibitors).  Surgery to repair the hernia, if other treatments are not helping.  If you have no symptoms, you may not need treatment. Follow these instructions at home: Lifestyle and activity  Do not use any products that contain nicotine or tobacco, such as cigarettes and e-cigarettes. If you need help quitting, ask your health care provider.  Try to achieve and maintain a healthy body weight.  Avoid putting pressure on your abdomen. Anything that puts pressure on your abdomen increases the amount of acid that may be pushed up into your esophagus. ? Avoid bending over, especially after eating. ? Raise the head of your bed by putting blocks under the legs. This keeps your head and esophagus higher than your stomach. ? Do not wear tight clothing around your chest or stomach. ? Try not to strain when having a bowel movement, when urinating, or when lifting heavy objects. Eating and drinking  Avoid foods that can worsen GERD symptoms. These may include: ? Fatty foods, like fried foods. ? Citrus fruits, like oranges or lemon. ? Other foods and drinks that contain acid, like orange juice or tomatoes. ? Spicy  food. ? Chocolate.  Eat frequent small meals instead of three large meals a day. This helps prevent your stomach from getting too full. ? Eat slowly. ? Do not lie down right after eating. ? Do not eat 1-2 hours before bed.  Do not drink beverages with caffeine. These include cola, coffee, cocoa, and tea.  Do not drink alcohol. General instructions  Take over-the-counter and prescription medicines only as told by your health care provider.  Keep all follow-up visits as told by your health care provider. This is important. Contact a health care provider if:  Your symptoms are not controlled with medicines or lifestyle changes.  You are having trouble swallowing.  You have coughing or wheezing that will not go away. Get help right away if:  Your pain is getting worse.  Your pain spreads to your arms, neck, jaw, teeth, or back.  You have shortness of breath.  You sweat for no reason.  You feel sick to your stomach (nauseous) or you vomit.  You vomit blood.  You have bright red blood in your stools.  You have black, tarry stools. This information is not intended to replace advice given to you by your health care provider.  Make sure you discuss any questions you have with your health care provider. Document Released: 02/10/2004 Document Revised: 11/13/2016 Document Reviewed: 11/13/2016 Elsevier Interactive Patient Education  [employer].

## 2017-09-05 NOTE — Op Note (Signed)
[facility] Patient Name: Brenda [name] Procedure Date: 09/05/2017 12:22 PM MRN: [id number] Date of Birth: 05/09/1971 Attending MD: Hildred Laser , MD CSN: [id number] Age: 46 Admit Type: Outpatient Procedure:                Upper GI endoscopy Indications:              Esophageal dysphagia, Gastro-esophageal reflux                            disease Providers:                Hildred Laser, MD, Lurline Del, RN, Selena Lesser,                            Aram Candela Referring MD:             Katharine Look. Sumner Boast, NP Medicines:                Lidocaine spray, Meperidine 100 mg IV, Midazolam 12                            mg IV Complications:            No immediate complications. Estimated Blood Loss:     Estimated blood loss was minimal. Procedure:                Pre-Anesthesia Assessment:                           - Prior to the procedure, a History and Physical                            was performed, and patient medications and                            allergies were reviewed. The patient's tolerance of                            previous anesthesia was also reviewed. The risks                            and benefits of the procedure and the sedation                            options and risks were discussed with the patient.                            All questions were answered, and informed consent                            was obtained. Prior Anticoagulants: The patient has                            taken no previous anticoagulant or antiplatelet                            agents.  ASA Grade Assessment: II - A patient with                            mild systemic disease. After reviewing the risks                            and benefits, the patient was deemed in                            satisfactory condition to undergo the procedure.                           After obtaining informed consent, the endoscope was                            passed under direct vision.  Throughout the                            procedure, the patient's blood pressure, pulse, and                            oxygen saturations were monitored continuously. The                            EG-[id number] ([id number]) scope was introduced through the                            mouth, and advanced to the second part of duodenum.                            The upper GI endoscopy was accomplished without                            difficulty. The patient tolerated the procedure                            well. Scope In: 1:36:57 PM Scope Out: 1:56:48 PM Total Procedure Duration: 0 hours 19 minutes 51 seconds  Findings:      Mild mucosal changes characterized by granularity, altered texture and       vertical lines were found in the mid esophagus. Biopsies were taken with       a cold forceps for histology.      No endoscopic abnormality was evident in the esophagus to explain the       patient's complaint of dysphagia. It was decided, however, to proceed       with dilation of the entire esophagus. The scope was withdrawn. Dilation       was performed with a Maloney dilator with no resistance at 48 Fr and 52       Fr. The dilation site was examined following endoscope reinsertion and       showed no bleeding, mucosal tear or perforation.      The Z-line was irregular and was found 37 cm from the incisors.      A 2 cm hiatal hernia was present.  The entire examined stomach was normal.      The duodenal bulb and second portion of the duodenum were normal. Impression:               - Granular, texture changed, vertically lined                            mucosa in the esophagus. Biopsied.                           - No endoscopic esophageal abnormality or stricture                            to explain patient's dysphagia. Esophagus dilated.                           - Z-line irregular, 37 cm from the incisors.                           - 2 cm hiatal hernia.                           -  Normal stomach.                           - Normal duodenal bulb and second portion of the                            duodenum. Moderate Sedation:      Moderate (conscious) sedation was administered by the endoscopy nurse       and supervised by the endoscopist. The following parameters were       monitored: oxygen saturation, heart rate, blood pressure, CO2       capnography and response to care. Total physician intraservice time was       26 minutes. Recommendation:           - Patient has a contact number available for                            emergencies. The signs and symptoms of potential                            delayed complications were discussed with the                            patient. Return to normal activities tomorrow.                            Written discharge instructions were provided to the                            patient.                           - Resume previous diet today.                           -  Continue present medications.                           - Await pathology results. Procedure Code(s):        --- Professional ---                           [phone], Esophagogastroduodenoscopy, flexible,                            transoral; with biopsy, single or multiple                           43450, Dilation of esophagus, by unguided sound or                            bougie, single or multiple passes                           99152, Moderate sedation services provided by the                            same physician or other qualified health care                            professional performing the diagnostic or                            therapeutic service that the sedation supports,                            requiring the presence of an independent trained                            observer to assist in the monitoring of the                            patient's level of consciousness and physiological                            status; initial  15 minutes of intraservice time,                            patient age 74 years or older                           [phone], Moderate sedation services; each additional                            15 minutes intraservice time Diagnosis Code(s):        --- Professional ---                           K22.8, Other specified diseases of esophagus  K44.9, Diaphragmatic hernia without obstruction or                            gangrene                           R13.14, Dysphagia, pharyngoesophageal phase                           K21.9, Gastro-esophageal reflux disease without                            esophagitis CPT copyright 2016 [employer]. All rights reserved. The codes documented in this report are preliminary and upon coder review may  be revised to meet current compliance requirements. Hildred Laser, MD Hildred Laser, MD 09/05/2017 2:09:40 PM This report has been signed electronically. Number of Addenda: 0

## 2017-09-05 NOTE — H&P (Signed)
Brenda [name] is an 46 y.o. female.   Chief Complaint: Patient is here for EGD and ED. HPI: patient is 46 year old Caucasian female with 15 history of GERD who presents with solid food dysphagia which started2 months ago. She had one episode of food impaction reieved spontaneously. She is on Nexi NSAIDsis not working as well as it should. For this she  Prilosec. She denies nausea vomiting abdominal pain or melena. She also has chronic constipation and begun on Linzess last week. Last EGD with ED was in August 2005.  Past Medical History:  Diagnosis Date  . Asthma   . GERD (gastroesophageal reflux disease)   . Seasonal allergies     Past Surgical History:  Procedure Laterality Date  . ABDOMINAL HYSTERECTOMY    . BACK SURGERY     3-4 yrs ago.  Marland Kitchen BLADDER SURGERY     incontinence  . BREAST REDUCTION SURGERY Bilateral 08/30/2015   Procedure: BREAST REDUCTION WITH LIPOSUCTION;  Surgeon: [name], MD;  Location: [place];  Service: Plastics;  Laterality: Bilateral;  . EGD/ED     5 yr ago by Dr. Laural Golden  . HIGH RISK BREAST EXCISION Right 2011  . REDUCTION MAMMAPLASTY Bilateral 2016    Family History  Problem Relation Age of Onset  . Cancer Mother 62       endometrial cancer and cervical cancer   . Cancer Maternal Grandmother        prostate cancer   Social History:  reports that she has never smoked. She has never used smokeless tobacco. She reports that she does not drink alcohol or use drugs.  Allergies:  Allergies  Allergen Reactions  . Other     Grass ,  Bed mites,   Dogs  And  Cats.   . Adhesive [Tape] Rash  . Latex Rash    Medications Prior to Admission  Medication Sig Dispense Refill  . cetirizine (ZYRTEC) 10 MG tablet Take 10 mg by mouth as needed for allergies.    Marland Kitchen esomeprazole (NEXIUM) 20 MG capsule Take 20 mg by mouth 2 (two) times daily before a meal.    . ibuprofen (ADVIL,MOTRIN) 200 MG tablet Take 600 mg by mouth every 8 (eight) hours as  needed (back pain).     Marland Kitchen linaclotide (LINZESS) 290 MCG CAPS capsule Take 1 capsule (290 mcg total) by mouth daily before breakfast. 30 capsule 3  . QVAR 80 MCG/ACT inhaler INHALE 2 PUFFS INTO THE LUNGS 2 (TWO) TIMES DAILY. 26.1 g 0  . albuterol (PROVENTIL HFA;VENTOLIN HFA) 108 (90 Base) MCG/ACT inhaler Inhale 2 puffs into the lungs every 6 (six) hours as needed for wheezing or shortness of breath. 1 Inhaler 0  . albuterol (PROVENTIL) (2.5 MG/3ML) 0.083% nebulizer solution Use q 4 hrs via nebulizer prn wheezing (Patient taking differently: Take 2.5 mg by nebulization every 4 (four) hours as needed for wheezing. ) 25 vial 0  . lubiprostone (AMITIZA) 24 MCG capsule Take 1 capsule (24 mcg total) by mouth 2 (two) times daily with a meal. (Patient not taking: Reported on 08/31/2017) 60 capsule 3    No results found for this or any previous visit (from the past 48 hour(s)). No results found.  ROS  Blood pressure 122/70, pulse 74, temperature 97.8 F (36.6 C), temperature source Oral, resp. rate 16, height 5\' 7"  (1.702 m), weight 225 lb (102.1 kg), last menstrual period 07/06/2004, SpO2 100 %. Physical Exam  Constitutional: She appears well-developed and well-nourished.  HENT:  Mouth/Throat: Oropharynx is clear and moist.  Eyes: Conjunctivae are normal. No scleral icterus.  Neck: No thyromegaly present.  Cardiovascular: Normal rate, regular rhythm and normal heart sounds.   No murmur heard. Respiratory: Effort normal and breath sounds normal.  GI: Soft. She exhibits no distension and no mass. There is no tenderness.  Musculoskeletal: She exhibits no edema.  Lymphadenopathy:    She has no cervical adenopathy.  Neurological: She is alert.  Skin: Skin is warm and dry.     Assessment/Plan Solid food dysphagia in patient with chronic GERD.  Hildred Laser, MD 09/05/2017, 1:24 PM

## 2017-09-11 ENCOUNTER — Encounter (HOSPITAL_COMMUNITY): Payer: Self-pay | Admitting: Internal Medicine

## 2017-09-24 ENCOUNTER — Ambulatory Visit (INDEPENDENT_AMBULATORY_CARE_PROVIDER_SITE_OTHER): Payer: BLUE CROSS/BLUE SHIELD | Admitting: Nurse Practitioner

## 2017-09-24 ENCOUNTER — Encounter: Payer: Self-pay | Admitting: Nurse Practitioner

## 2017-09-24 VITALS — BP 122/82 | Ht 67.0 in | Wt 224.6 lb

## 2017-09-24 DIAGNOSIS — F329 Major depressive disorder, single episode, unspecified: Secondary | ICD-10-CM | POA: Diagnosis not present

## 2017-09-24 DIAGNOSIS — F419 Anxiety disorder, unspecified: Secondary | ICD-10-CM

## 2017-09-24 DIAGNOSIS — Z01419 Encounter for gynecological examination (general) (routine) without abnormal findings: Secondary | ICD-10-CM | POA: Diagnosis not present

## 2017-09-24 DIAGNOSIS — F32A Depression, unspecified: Secondary | ICD-10-CM

## 2017-09-24 MED ORDER — ESCITALOPRAM OXALATE 10 MG PO TABS: 10.0000 mg | ORAL_TABLET | Freq: Every day | ORAL | 2 refills | Status: DC

## 2017-09-24 NOTE — Patient Instructions (Signed)
Nasacort AQ

## 2017-09-24 NOTE — Progress Notes (Signed)
Subjective:    Patient ID: Brenda [name], female    DOB: 1971/02/01, 46 y.o.   MRN: [id number]  HPI presents for her wellness exam.  States she has had a partial hysterectomy.  No pelvic pain.  Same sexual partner.  Regular vision exams.  Needs a dental exam.  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  Has been doing a lot of stress eating lately.  Her main source of stress comes from her husband's nephew who is living in their home and has caused a great deal of conflict. Depression screen [facility] 2/9 09/24/2017 09/24/2017  Decreased Interest 2 1  Down, Depressed, Hopeless 2 1  PHQ - 2 Score 4 2  Altered sleeping 3 -  Tired, decreased energy 2 -  Change in appetite 3 -  Feeling bad or failure about yourself  3 -  Trouble concentrating 2 -  Moving slowly or fidgety/restless 2 -  Suicidal thoughts 2 -  PHQ-9 Score 21 -  Difficult doing work/chores Somewhat difficult -   Stopped her Lexapro several months ago, tended to make her sleepy but was taking it during her daytime hours.  Works night shift.    Review of Systems  Constitutional: Positive for appetite change and fatigue. Negative for activity change.  HENT: Positive for rhinorrhea and sinus pressure. Negative for dental problem, ear pain and sore throat.   Respiratory: Negative for cough, chest tightness, shortness of breath and wheezing.   Cardiovascular: Negative for chest pain.  Gastrointestinal: Negative for abdominal distention, abdominal pain, [name] in stool, constipation, diarrhea, nausea and vomiting.  Genitourinary: Negative for difficulty urinating, dysuria, enuresis, frequency, genital sores, pelvic pain, urgency and vaginal discharge.       Objective:   Physical Exam  Constitutional: She is oriented to person, place, and time. She appears well-developed. No distress.  HENT:  Right Ear: External ear normal.  Left Ear: External ear normal.  Mouth/Throat: Oropharynx is clear and moist.  TMs retracted, no  erythema.  Pharynx nonerythematous with clear PND noted.  Neck: Normal range of motion. Neck supple. No tracheal deviation present. No thyromegaly present.  Cardiovascular: Normal rate, regular rhythm and normal heart sounds.  Exam reveals no gallop.   No murmur heard. Pulmonary/Chest: Effort normal and breath sounds normal. Right breast exhibits no inverted nipple, no mass, no skin change and no tenderness. Left breast exhibits no inverted nipple, no mass, no skin change and no tenderness. Breasts are symmetrical.  Abdominal: Soft. She exhibits no distension. There is no tenderness.  Genitourinary: Vagina normal. No vaginal discharge found.  Genitourinary Comments: External GU: No rashes or lesions.  Vagina slightly pale and dry.  Bimanual exam no tenderness or obvious masses but limited due to abdominal girth.  Musculoskeletal: She exhibits no edema.  Lymphadenopathy:    She has no cervical adenopathy.  Neurological: She is alert and oriented to person, place, and time.  Skin: Skin is warm and dry. No rash noted.  Psychiatric: She has a normal mood and affect. Her behavior is normal.  Vitals reviewed. Breast exam: Significant scar tissue in various areas due to breast reduction surgery.  No masses.  Axilla no adenopathy.        Assessment & Plan:   Problem List Items Addressed This Visit      Other   Anxiety and depression   Relevant Medications   escitalopram (LEXAPRO) 10 MG tablet    Other Visit Diagnoses    Well woman exam    -  Primary     Patient has done well on Lexapro in the past.  Agrees to restart this, take it when she is going to sleep.  Call back in a few weeks if no improvement.  Strongly recommend looking at her life situation to see if she can reduce her current stress level.  Lab work was ordered in September, encourage patient to get this done.  Recommend dental exam.  Recommend daily vitamin D/calcium supplementation.  Encouraged healthy diet and weight  loss. Return in about 3 months (around 12/25/2017) for recheck.

## 2017-10-16 ENCOUNTER — Other Ambulatory Visit: Payer: Self-pay

## 2017-10-16 MED ORDER — ESCITALOPRAM OXALATE 10 MG PO TABS: 10.0000 mg | ORAL_TABLET | Freq: Every day | ORAL | 0 refills | Status: DC

## 2017-11-28 ENCOUNTER — Ambulatory Visit (INDEPENDENT_AMBULATORY_CARE_PROVIDER_SITE_OTHER): Payer: BLUE CROSS/BLUE SHIELD | Admitting: Internal Medicine

## 2017-12-05 ENCOUNTER — Ambulatory Visit: Payer: BLUE CROSS/BLUE SHIELD | Admitting: Family Medicine

## 2017-12-05 ENCOUNTER — Encounter: Payer: Self-pay | Admitting: Family Medicine

## 2017-12-05 VITALS — BP 128/76 | Ht 67.0 in | Wt 216.0 lb

## 2017-12-05 DIAGNOSIS — R5383 Other fatigue: Secondary | ICD-10-CM | POA: Diagnosis not present

## 2017-12-05 DIAGNOSIS — J4520 Mild intermittent asthma, uncomplicated: Secondary | ICD-10-CM

## 2017-12-05 DIAGNOSIS — M778 Other enthesopathies, not elsewhere classified: Secondary | ICD-10-CM | POA: Diagnosis not present

## 2017-12-05 DIAGNOSIS — Z13220 Encounter for screening for lipoid disorders: Secondary | ICD-10-CM | POA: Diagnosis not present

## 2017-12-05 DIAGNOSIS — Z79899 Other long term (current) drug therapy: Secondary | ICD-10-CM | POA: Diagnosis not present

## 2017-12-05 MED ORDER — DICLOFENAC SODIUM 75 MG PO TBEC: 75.0000 mg | DELAYED_RELEASE_TABLET | Freq: Two times a day (BID) | ORAL | 0 refills | Status: DC

## 2017-12-05 NOTE — Progress Notes (Signed)
   Subjective:    Patient ID: Brenda [name], female    DOB: Aug 04, 1971, 47 y.o.   MRN: [id number] Patient comes in with multiple concerns HPIRight wrist pain for about 3 weeks. Was at working picking up a box and heard something pop.   Ws working and felt a sudden pop and pain and discomfort with it  Still stocking  Works at [employer]  troubl with turinign sudden bad paoin, in the lateral wrist   qvar faithfully and allergy med yr round, efinitely helping breathing along with taking reflux meds Overall asthma much more stable at this time   Review of Systems No headache, no major weight loss or weight gain, no chest pain no back pain abdominal pain no change in bowel habits complete ROS otherwise negative     Objective:   Physical Exam   Alert vitals stable, NAD. [name] pressure good on repeat. HEENT normal. Lungs clear. Heart regular rate and rhythm. Wrist ulnar tenderness evident.  Good range of motion.  Dorsal wrist mild pain with dorsiflexion negative de Quervain's tenderness.    Assessment & Plan:  Impression wrist sprain/tendinitis.  Discussed at length.  Short course of Voltaren twice daily 10 days.  Wear wrist splint.  Consider backing away from current job  2.  Asthma clinically stable discussed to maintain Qvar warning signs discussed

## 2017-12-06 ENCOUNTER — Encounter: Payer: Self-pay | Admitting: Nurse Practitioner

## 2017-12-06 LAB — LIPID PANEL
Chol/HDL Ratio: 3.4 ratio (ref 0.0–4.4)
Cholesterol, Total: 192 mg/dL (ref 100–199)
HDL: 57 mg/dL (ref >39)
LDL Calculated: 112 mg/dL — ABNORMAL HIGH (ref 0–99)
Triglycerides: 113 mg/dL (ref 0–149)
VLDL Cholesterol Cal: 23 mg/dL (ref 5–40)

## 2017-12-06 LAB — HEPATIC FUNCTION PANEL
ALT: 14 IU/L (ref 0–32)
AST: 18 IU/L (ref 0–40)
Albumin: 4.3 g/dL (ref 3.5–5.5)
Alkaline Phosphatase: 100 IU/L (ref 39–117)
Bilirubin Total: 0.2 mg/dL (ref 0.0–1.2)
Bilirubin, Direct: 0.07 mg/dL (ref 0.00–0.40)
Total Protein: 7.2 g/dL (ref 6.0–8.5)

## 2017-12-06 LAB — BASIC METABOLIC PANEL
BUN/Creatinine Ratio: 7 — ABNORMAL LOW (ref 9–23)
BUN: 6 mg/dL (ref 6–24)
CO2: 22 mmol/L (ref 20–29)
Calcium: 9.3 mg/dL (ref 8.7–10.2)
Chloride: 106 mmol/L (ref 96–106)
Creatinine, Ser: 0.87 mg/dL (ref 0.57–1.00)
GFR calc Af Amer: 91 mL/min/{1.73_m2} (ref >59)
GFR calc non Af Amer: 79 mL/min/{1.73_m2} (ref >59)
Glucose: 73 mg/dL (ref 65–99)
Potassium: 4.3 mmol/L (ref 3.5–5.2)
Sodium: 141 mmol/L (ref 134–144)

## 2017-12-06 LAB — TSH: TSH: 1.98 u[IU]/mL (ref 0.450–4.500)

## 2017-12-06 LAB — VITAMIN D 25 HYDROXY (VIT D DEFICIENCY, FRACTURES): Vit D, 25-Hydroxy: 25.2 ng/mL — ABNORMAL LOW (ref 30.0–100.0)

## 2017-12-25 ENCOUNTER — Encounter: Payer: Self-pay | Admitting: Family Medicine

## 2017-12-27 ENCOUNTER — Other Ambulatory Visit: Payer: Self-pay

## 2017-12-27 ENCOUNTER — Encounter (HOSPITAL_COMMUNITY): Payer: Self-pay | Admitting: Emergency Medicine

## 2017-12-27 ENCOUNTER — Emergency Department (HOSPITAL_COMMUNITY)
Admission: EM | Admit: 2017-12-27 | Discharge: 2017-12-27 | Disposition: A | Discharge status: Home / Self Care | Payer: Worker's Compensation | Attending: Emergency Medicine | Admitting: Emergency Medicine

## 2017-12-27 ENCOUNTER — Emergency Department (HOSPITAL_COMMUNITY): Payer: Worker's Compensation

## 2017-12-27 DIAGNOSIS — M549 Dorsalgia, unspecified: Secondary | ICD-10-CM | POA: Diagnosis not present

## 2017-12-27 DIAGNOSIS — Z79899 Other long term (current) drug therapy: Secondary | ICD-10-CM | POA: Diagnosis not present

## 2017-12-27 DIAGNOSIS — J45909 Unspecified asthma, uncomplicated: Secondary | ICD-10-CM | POA: Insufficient documentation

## 2017-12-27 DIAGNOSIS — W19XXXA Unspecified fall, initial encounter: Secondary | ICD-10-CM

## 2017-12-27 DIAGNOSIS — M545 Low back pain: Secondary | ICD-10-CM | POA: Diagnosis not present

## 2017-12-27 DIAGNOSIS — M546 Pain in thoracic spine: Secondary | ICD-10-CM | POA: Diagnosis not present

## 2017-12-27 DIAGNOSIS — Z91040 Latex allergy status: Secondary | ICD-10-CM | POA: Insufficient documentation

## 2017-12-27 DIAGNOSIS — M62830 Muscle spasm of back: Secondary | ICD-10-CM | POA: Diagnosis not present

## 2017-12-27 MED ORDER — IBUPROFEN 800 MG PO TABS
800.0000 mg | ORAL_TABLET | Freq: Once | ORAL | Status: AC
  Administered 2017-12-27: 800 mg via ORAL
  Filled 2017-12-27: qty 1

## 2017-12-27 NOTE — ED Triage Notes (Signed)
PT states she tripped over a cart at work and fell backward onto her buttocks and lower back. PT ambulatory in triage with NAD noted.

## 2017-12-27 NOTE — Discharge Instructions (Signed)
Your xrays are negative tonight for any fractures or bony injuries from todays fall.  Expect to be more sore tomorrow.  Use ice as much as possible over the next several days.

## 2017-12-28 NOTE — ED Provider Notes (Signed)
[facility] EMERGENCY DEPARTMENT Provider Note   CSN: [id number] Arrival date & time: 12/27/17  1517     History   Chief Complaint Chief Complaint  Patient presents with  . Fall    HPI Brenda [name] is a 47 y.o. female with history of asthma, GERD and chronic low back pain with a history of lumbar surgery presenting with thoracic and lumbar pain since she tripped and fell at work today.  She was standing outdoors where she works at [employer] and tripped over a cart landing directly on her buttocks and lower back.  She had immediate and persistent pain in her low back radiating into her upper back since arriving here.  She endorses muscle soreness and midline pain.  She denies head injury or LOC.  She has no weakness or numbness in her extremities and is ambulatory.  Pain is worsened with sitting and direct pressure.  She has had no medications prior to arrival.  The history is provided by the patient.    Past Medical History:  Diagnosis Date  . Asthma   . GERD (gastroesophageal reflux disease)   . Seasonal allergies     Patient Active Problem List   Diagnosis Date Noted  . Esophageal dysphagia 05/29/2017  . Gastroesophageal reflux disease without esophagitis 05/29/2017  . Atypical ductal hyperplasia of right breast 11/04/2015  . Hot flushes, perimenopausal 11/04/2015  . Morbid obesity ([place]) 04/30/2015  . Chronic upper back pain 04/30/2015  . GERD (gastroesophageal reflux disease) 05/14/2014  . Asthma, chronic obstructive, with acute exacerbation ([place]) 05/08/2014  . Anxiety and depression 04/08/2014  . Allergic rhinitis 04/01/2014  . Reactive airways dysfunction syndrome ([place]) 04/01/2014  . Rhinitis medicamentosa 10/14/2013  . BACK PAIN 05/12/2009  . SPONDYLOLYSIS 05/12/2009  . SPONDYLOLITHESIS 05/12/2009    Past Surgical History:  Procedure Laterality Date  . ABDOMINAL HYSTERECTOMY    . BACK SURGERY     3-4 yrs ago.  Marland Kitchen BLADDER SURGERY     incontinence  . BREAST  REDUCTION SURGERY Bilateral 08/30/2015   Procedure: BREAST REDUCTION WITH LIPOSUCTION;  Surgeon: [name], MD;  Location: [place];  Service: Plastics;  Laterality: Bilateral;  . EGD/ED     5 yr ago by Dr. Laural Golden  . ESOPHAGEAL DILATION N/A 09/05/2017   Procedure: ESOPHAGEAL DILATION;  Surgeon: Rogene Houston, MD;  [facility];  Service: Endoscopy;  Laterality: N/A;  . ESOPHAGOGASTRODUODENOSCOPY N/A 09/05/2017   Procedure: ESOPHAGOGASTRODUODENOSCOPY (EGD);  Surgeon: Rogene Houston, MD;  [facility];  Service: Endoscopy;  Laterality: N/A;  1:00  . HIGH RISK BREAST EXCISION Right 2011  . REDUCTION MAMMAPLASTY Bilateral 2016    OB History    No data available       Home Medications    Prior to Admission medications   Medication Sig Start Date End Date Taking? Authorizing Provider  albuterol (PROVENTIL HFA;VENTOLIN HFA) 108 (90 Base) MCG/ACT inhaler Inhale 2 puffs into the lungs every 6 (six) hours as needed for wheezing or shortness of breath. 11/02/16   [name], MD  albuterol (PROVENTIL) (2.5 MG/3ML) 0.083% nebulizer solution Use q 4 hrs via nebulizer prn wheezing Patient taking differently: Take 2.5 mg by nebulization every 4 (four) hours as needed for wheezing.  05/11/17   Nilda Simmer, NP  cetirizine (ZYRTEC) 10 MG tablet Take 10 mg by mouth as needed for allergies.    [provider]  diclofenac (VOLTAREN) 75 MG EC tablet Take 1 tablet (  75 mg total) by mouth 2 (two) times daily. Take with food 12/05/17   [name], MD  escitalopram (LEXAPRO) 10 MG tablet Take 1 tablet (10 mg total) daily by mouth. 10/16/17 10/16/18  Nilda Simmer, NP  linaclotide [facility]) 290 MCG CAPS capsule Take 1 capsule (290 mcg total) by mouth daily before breakfast. 08/29/17   Setzer, Rona Ravens, NP  pantoprazole (PROTONIX) 40 MG tablet Take 1 tablet (40 mg total) by mouth daily before breakfast. 09/05/17   Rehman, Mechele Dawley, MD  QVAR  80 MCG/ACT inhaler INHALE 2 PUFFS INTO THE LUNGS 2 (TWO) TIMES DAILY. 02/26/17   [name], MD    Family History Family History  Problem Relation Age of Onset  . Cancer Mother 50       endometrial cancer and cervical cancer   . Cancer Maternal Grandmother        prostate cancer    Social History Social History   Tobacco Use  . Smoking status: Never Smoker  . Smokeless tobacco: Never Used  Substance Use Topics  . Alcohol use: No    Comment: rarely   . Drug use: No     Allergies   Other; Adhesive [tape]; and Latex   Review of Systems Review of Systems  Constitutional: Negative for fever.  Respiratory: Negative for shortness of breath.   Cardiovascular: Negative for chest pain and leg swelling.  Gastrointestinal: Negative for abdominal distention, abdominal pain and constipation.  Genitourinary: Negative for difficulty urinating, dysuria, flank pain, frequency and urgency.  Musculoskeletal: Positive for back pain. Negative for gait problem and joint swelling.  Skin: Negative for rash.  Neurological: Negative for weakness and numbness.     Physical Exam Updated Vital Signs BP 128/76 (BP Location: Right Arm)   Pulse 82   Temp 98.4 F (36.9 C) (Oral)   Resp 18   Ht 5\' 7"  (1.702 m)   Wt 97.5 kg (215 lb)   LMP 07/06/2004   SpO2 100%   BMI 33.67 kg/m   Physical Exam  Constitutional: She appears well-developed and well-nourished.  HENT:  Head: Normocephalic.  Eyes: Conjunctivae are normal.  Neck: Normal range of motion. Neck supple.  Cardiovascular: Normal rate and intact distal pulses.  Pedal pulses normal.  Pulmonary/Chest: Effort normal.  Abdominal: Soft. Bowel sounds are normal. She exhibits no distension and no mass.  Musculoskeletal: Normal range of motion. She exhibits no edema.       Right shoulder: She exhibits bony tenderness and pain. She exhibits no deformity.       Thoracic back: She exhibits bony tenderness. She exhibits no spasm.        Lumbar back: She exhibits tenderness and spasm. She exhibits no swelling and no edema.  Neurological: She is alert. She has normal strength. She displays no atrophy and no tremor. No sensory deficit. Gait normal.  No strength deficit noted in hip and knee flexor and extensor muscle groups.  Ankle flexion and extension intact.   Skin: Skin is warm and dry.  Psychiatric: She has a normal mood and affect.  Nursing note and vitals reviewed.    ED Treatments / Results  Labs (all labs ordered are listed, but only abnormal results are displayed) Labs Reviewed - No data to display  EKG  EKG Interpretation None       Radiology Dg Thoracic Spine 2 View  Result Date: 12/27/2017 CLINICAL DATA:  47 year old female status post trip and fall backwards at work. Thoracolumbar back and  buttock pain. EXAM: THORACIC SPINE 2 VIEWS COMPARISON:  Chest radiographs 02/25/2016. FINDINGS: Bone mineralization is within normal limits. Stable thoracic vertebral height and alignment with preserved kyphosis. Hypoplastic or absent ribs at T12. Slight levoconvex upper thoracic spine curvature. Mild anterior disc space loss in the upper thoracic spine appears stable. Posterior ribs appear intact. Negative visible thoracic visceral contours. Intact visualized upper lumbar levels. IMPRESSION: No acute osseous abnormality identified in the thoracic spine. Electronically Signed   By: Genevie Ann M.D.   On: 12/27/2017 20:45   Dg Lumbar Spine Complete  Result Date: 12/27/2017 CLINICAL DATA:  47 year old female status post trip and fall backwards at work. Thoracolumbar back and buttock pain. EXAM: LUMBAR SPINE - COMPLETE 4+ VIEW COMPARISON:  Thoracic spine radiographs today reported separately. Lumbar MRI 12/27/2009. FINDINGS: Hypoplastic or absent ribs demonstrated at the T12 level today on the thoracic comparison. Full size ribs at T11. Therefore there is transitional lumbosacral anatomy. The L5 level appears fully lumbarized  scratched at the L5 level appears fully sacralized. Posterior and interbody fusion hardware is in place at the L4-L5 level, new from the prior MRI. Hardware appears intact. Preserved disc spaces elsewhere. Mild dextroconvex lumbar scoliosis. Sacral ala and SI joints appear intact. IMPRESSION: 1.  No acute osseous abnormality identified in the lumbar spine. 2. Transitional lumbosacral anatomy with sacralized L5 level. Sequelae of posterior and interbody fusion at L4-L5. Electronically Signed   By: Genevie Ann M.D.   On: 12/27/2017 20:48   Dg Sacrum/coccyx  Result Date: 12/27/2017 CLINICAL DATA:  47 year old female status post trip and fall backwards at work. Thoracolumbar back and buttock pain. EXAM: SACRUM AND COCCYX - 2+ VIEW COMPARISON:  Lumbar radiographs today reported separately. FINDINGS: Transitional lumbosacral anatomy as detailed on the comparison today. Sacral segments appear intact on the lateral view. Sacral ala appear intact. Bilateral SI joints appear normal. Coccygeal segments appear within normal limits. Elsewhere the visible pelvis appears intact. There are small calcified pelvic phleboliths. IMPRESSION: No acute fracture or dislocation identified about the sacrum or coccyx. Electronically Signed   By: Genevie Ann M.D.   On: 12/27/2017 20:49    Procedures Procedures (including critical care time)  Medications Ordered in ED Medications  ibuprofen (ADVIL,MOTRIN) tablet 800 mg (800 mg Oral Given 12/27/17 2049)     Initial Impression / Assessment and Plan / ED Course  I have reviewed the triage vital signs and the nursing notes.  Pertinent labs & imaging results that were available during my care of the patient were reviewed by me and considered in my medical decision making (see chart for details).     Imaging reviewed and discussed with patient.  No acute fractures or dislocations.  Discussed home treatments including ice therapy, followed by heat therapy after inflammation is resolved.   Ibuprofen, Tylenol.  Discussed muscle relaxers which patient defers.  PRN follow-up with PCP if symptoms persist or worsen.  Final Clinical Impressions(s) / ED Diagnoses   Final diagnoses:  Fall, initial encounter  Acute bilateral back pain, unspecified back location    ED Discharge Orders    None       Landis Martins 12/28/17 0138    Margette Fast, MD 12/28/17 [phone]

## 2018-01-10 ENCOUNTER — Ambulatory Visit: Payer: Self-pay | Admitting: Family Medicine

## 2018-01-10 ENCOUNTER — Telehealth: Payer: Self-pay | Admitting: Family Medicine

## 2018-01-10 NOTE — Telephone Encounter (Signed)
Patient called to follow up on a fall she had at work on 12/27/17.  I advised her we do not handle worker's compensation claims and for her to contact her HR and find a doctor who would be able to follow up with her.  She understood.

## 2018-01-22 ENCOUNTER — Other Ambulatory Visit: Payer: Self-pay | Admitting: Nurse Practitioner

## 2018-02-16 ENCOUNTER — Other Ambulatory Visit: Payer: Self-pay | Admitting: Family Medicine

## 2018-02-19 ENCOUNTER — Other Ambulatory Visit: Payer: Self-pay | Admitting: *Deleted

## 2018-02-19 MED ORDER — BECLOMETHASONE DIPROP HFA 80 MCG/ACT IN AERB: 2.0000 | INHALATION_SPRAY | Freq: Two times a day (BID) | RESPIRATORY_TRACT | 5 refills | Status: DC

## 2018-02-22 ENCOUNTER — Other Ambulatory Visit: Payer: Self-pay | Admitting: Preventative Medicine

## 2018-02-22 DIAGNOSIS — M4800 Spinal stenosis, site unspecified: Secondary | ICD-10-CM

## 2018-02-22 DIAGNOSIS — M5126 Other intervertebral disc displacement, lumbar region: Secondary | ICD-10-CM

## 2018-03-01 ENCOUNTER — Other Ambulatory Visit (INDEPENDENT_AMBULATORY_CARE_PROVIDER_SITE_OTHER): Payer: Self-pay | Admitting: Internal Medicine

## 2018-03-04 ENCOUNTER — Ambulatory Visit
Admission: RE | Admit: 2018-03-04 | Discharge: 2018-03-04 | Disposition: A | Discharge status: Home / Self Care | Payer: Worker's Compensation | Source: Ambulatory Visit | Attending: Preventative Medicine | Admitting: Preventative Medicine

## 2018-03-04 DIAGNOSIS — M4800 Spinal stenosis, site unspecified: Secondary | ICD-10-CM

## 2018-03-04 DIAGNOSIS — M5126 Other intervertebral disc displacement, lumbar region: Secondary | ICD-10-CM

## 2018-03-04 MED ORDER — METHYLPREDNISOLONE ACETATE 40 MG/ML INJ SUSP (RADIOLOG
120.0000 mg | Freq: Once | INTRAMUSCULAR | Status: AC
  Administered 2018-03-04: 120 mg via EPIDURAL

## 2018-03-04 MED ORDER — IOPAMIDOL (ISOVUE-M 200) INJECTION 41%
1.0000 mL | Freq: Once | INTRAMUSCULAR | Status: AC
  Administered 2018-03-04: 1 mL via EPIDURAL

## 2018-03-04 NOTE — Discharge Instructions (Signed)

## 2018-03-05 ENCOUNTER — Other Ambulatory Visit: Payer: Self-pay | Admitting: Preventative Medicine

## 2018-03-05 DIAGNOSIS — M48061 Spinal stenosis, lumbar region without neurogenic claudication: Secondary | ICD-10-CM

## 2018-03-18 ENCOUNTER — Other Ambulatory Visit: Payer: Self-pay

## 2018-03-18 ENCOUNTER — Ambulatory Visit
Admission: RE | Admit: 2018-03-18 | Discharge: 2018-03-18 | Disposition: A | Discharge status: Home / Self Care | Payer: Worker's Compensation | Source: Ambulatory Visit | Attending: Preventative Medicine | Admitting: Preventative Medicine

## 2018-03-18 DIAGNOSIS — M48061 Spinal stenosis, lumbar region without neurogenic claudication: Secondary | ICD-10-CM

## 2018-03-18 MED ORDER — IOPAMIDOL (ISOVUE-M 200) INJECTION 41%
1.0000 mL | Freq: Once | INTRAMUSCULAR | Status: AC
  Administered 2018-03-18: 1 mL via EPIDURAL

## 2018-03-18 MED ORDER — METHYLPREDNISOLONE ACETATE 40 MG/ML INJ SUSP (RADIOLOG
120.0000 mg | Freq: Once | INTRAMUSCULAR | Status: AC
  Administered 2018-03-18: 120 mg via EPIDURAL

## 2018-04-01 ENCOUNTER — Ambulatory Visit
Admission: RE | Admit: 2018-04-01 | Discharge: 2018-04-01 | Disposition: A | Discharge status: Home / Self Care | Payer: Worker's Compensation | Source: Ambulatory Visit | Attending: Preventative Medicine | Admitting: Preventative Medicine

## 2018-04-01 DIAGNOSIS — M48061 Spinal stenosis, lumbar region without neurogenic claudication: Secondary | ICD-10-CM

## 2018-04-01 MED ORDER — IOPAMIDOL (ISOVUE-M 200) INJECTION 41%
1.0000 mL | Freq: Once | INTRAMUSCULAR | Status: AC
  Administered 2018-04-01: 1 mL via EPIDURAL

## 2018-04-01 MED ORDER — METHYLPREDNISOLONE ACETATE 40 MG/ML INJ SUSP (RADIOLOG
120.0000 mg | Freq: Once | INTRAMUSCULAR | Status: AC
  Administered 2018-04-01: 120 mg via EPIDURAL

## 2018-04-01 NOTE — Discharge Instructions (Signed)

## 2018-08-28 ENCOUNTER — Other Ambulatory Visit (INDEPENDENT_AMBULATORY_CARE_PROVIDER_SITE_OTHER): Payer: Self-pay | Admitting: Internal Medicine

## 2018-08-28 NOTE — Telephone Encounter (Signed)
Patient will need appointment prior to further refills. She was last seen June 2018 or the patient may get refills from her PCP.

## 2018-09-13 ENCOUNTER — Ambulatory Visit: Payer: Self-pay | Admitting: Family Medicine

## 2018-09-13 ENCOUNTER — Other Ambulatory Visit (HOSPITAL_COMMUNITY)
Admission: RE | Admit: 2018-09-13 | Discharge: 2018-09-13 | Disposition: A | Discharge status: Home / Self Care | Payer: Self-pay | Source: Other Acute Inpatient Hospital | Attending: Family Medicine | Admitting: Family Medicine

## 2018-09-13 ENCOUNTER — Encounter: Payer: Self-pay | Admitting: Family Medicine

## 2018-09-13 VITALS — BP 124/86 | Temp 98.8°F | Ht 67.0 in | Wt 232.0 lb

## 2018-09-13 DIAGNOSIS — R079 Chest pain, unspecified: Secondary | ICD-10-CM | POA: Insufficient documentation

## 2018-09-13 DIAGNOSIS — R42 Dizziness and giddiness: Secondary | ICD-10-CM | POA: Insufficient documentation

## 2018-09-13 LAB — POCT GLUCOSE (DEVICE FOR HOME USE): POC Glucose: 150 mg/dl — AB (ref 70–99)

## 2018-09-13 LAB — TROPONIN I: Troponin I: <0.03 ng/mL (ref <0.03)

## 2018-09-13 LAB — POCT HEMOGLOBIN: Hemoglobin: 12.4 g/dL (ref 12.2–16.2)

## 2018-09-13 MED ORDER — NAPROXEN 500 MG PO TABS: 500.0000 mg | ORAL_TABLET | Freq: Two times a day (BID) | ORAL | 0 refills | Status: DC

## 2018-09-13 MED ORDER — ALPRAZOLAM 0.5 MG PO TABS: ORAL_TABLET | ORAL | 0 refills | Status: DC

## 2018-09-13 MED ORDER — ESCITALOPRAM OXALATE 10 MG PO TABS: 10.0000 mg | ORAL_TABLET | Freq: Every day | ORAL | 1 refills | Status: DC

## 2018-09-13 NOTE — Progress Notes (Signed)
Subjective:    Patient ID: Brenda [name], female    DOB: 04-06-1971, 47 y.o.   MRN: [id number]  HPIdizziness for 5 days,  Very nice patient Having problems with dizziness funny vision not feeling good denies nausea vomiting diarrhea is also had some non-angina chest pain where she feels a sharp pain in her chest and ache in her chest that comes and goes sometimes central sometimes left upper into the arm denies wheezing shortness of breath with it relates is under tremendous amount of stress she lost her job about a month ago does not have insurance anymore and is worried about how she will pay the bills at times she feels stressed about it but denies being depressed with it. Blurry vision for past 4 days.   Headaches in back of head and neck pain for years but getting worse. Tried tylenol for headache.  She relates at times headache in the back of her head back to the neck this been going off and on for years but seems to be getting worse  Chest pain center to left side and left arm pain. Started today after calling for appt. when she does do activities she does not experiencing any type of chest tightness pressure pain no shortness of breath  Feeling faint and like her heart is dropping.  At times she feels lightheaded and at times it makes her feel like her heart is going to slow down or she will pass out  Results for orders placed or performed in visit on 09/13/18  POCT hemoglobin  Result Value Ref Range   Hemoglobin 12.4 12.2 - 16.2 g/dL  POCT Glucose (Device for Home Use)  Result Value Ref Range   Glucose Fasting, POC     POC Glucose 150 (A) 70 - 99 mg/dl      Review of Systems  Constitutional: Negative for activity change, appetite change and fatigue.  HENT: Negative for congestion and rhinorrhea.   Respiratory: Negative for cough, chest tightness and shortness of breath.   Cardiovascular: Negative for chest pain and leg swelling.  Gastrointestinal: Negative for abdominal  pain and diarrhea.  Endocrine: Negative for polydipsia and polyphagia.  Skin: Negative for color change.  Neurological: Negative for dizziness and weakness.  Psychiatric/Behavioral: Negative for behavioral problems and confusion.       Objective:   Physical Exam  Constitutional: She appears well-nourished. No distress.  HENT:  Head: Normocephalic and atraumatic.  Eyes: Right eye exhibits no discharge. Left eye exhibits no discharge.  Neck: No tracheal deviation present.  Cardiovascular: Normal rate, regular rhythm and normal heart sounds.  No murmur heard. Pulmonary/Chest: Effort normal and breath sounds normal. No respiratory distress.  Musculoskeletal: She exhibits no edema.  Lymphadenopathy:    She has no cervical adenopathy.  Neurological: She is alert. Coordination normal.  Skin: Skin is warm and dry.  Psychiatric: She has a normal mood and affect. Her behavior is normal.  Vitals reviewed.  EKG overall looks good       Assessment & Plan:  Neck pain discomfort-we did talk about some gentle range of motion exercises Anti-inflammatory Massage I do not recommend any advanced treatment such as MRI at this point  I feel some of her symptomatology is stress related issues Patient would like to stay on her Lexapro she is just not been taking it regularly lately We did offer different antidepressant but she does not want to do that Counseling could be beneficial if ongoing troubles  EKG does  not show any acute changes troponin ordered awaiting the results of this I doubt cardiac disease  Troponin came back negative.  Patient was informed of this.  Patient was encouraged that if she has ongoing trouble to follow-up immediately  Patient under tremendous amount of stress and anxiety related to this we talked about techniques to try to help this

## 2018-09-13 NOTE — Progress Notes (Signed)
   Subjective:    Patient ID: Brenda [name], female    DOB: 1971/05/16, 47 y.o.   MRN: [id number]  HPI    Review of Systems     Objective:   Physical Exam        Assessment & Plan:

## 2018-09-20 ENCOUNTER — Encounter: Payer: Self-pay | Admitting: Family Medicine

## 2018-10-08 ENCOUNTER — Telehealth: Payer: Self-pay | Admitting: Orthopaedic Surgery

## 2018-10-08 NOTE — Telephone Encounter (Signed)
Patient's sister Lynett Fish called on her behalf - I requested to speak with patient; states she was not with her, but wanted to inquire about scheduling immediate appointment (which we do not have, as clinic is closing); problem is wrist injury to a fall yesterday, 10/07/18. She mentioned contacting homeowner's insurance, which I relayed is a 3rd party insurer which [facility] is unable to file.  I provided general information only, due to patient being unavailable. States she will discuss perhaps going to urgent care Vs. Emergency room, due to no insurance.  Will call back if still needs to schedule afterwards.

## 2018-12-12 ENCOUNTER — Telehealth: Payer: Self-pay | Admitting: Family Medicine

## 2018-12-12 MED ORDER — OSELTAMIVIR PHOSPHATE 75 MG PO CAPS: 75.0000 mg | ORAL_CAPSULE | Freq: Two times a day (BID) | ORAL | 0 refills | Status: DC

## 2018-12-12 NOTE — Telephone Encounter (Signed)
tamiflu 75 bid five d 

## 2018-12-12 NOTE — Telephone Encounter (Signed)
Son was seen in office Monday and diagnosed with the flu , and now pt has started showing symptoms of a bad head ache, throat is achy, pt states she feels warm. Pt would like a prescription for Tamiflu. Advise.   Pharmacy:  [id number] - [place], [place]

## 2018-12-12 NOTE — Telephone Encounter (Signed)
Prescription sent electronically to pharmacy. Left message to return call to notify patient. 

## 2018-12-12 NOTE — Telephone Encounter (Signed)
Patient notified

## 2018-12-14 ENCOUNTER — Other Ambulatory Visit (INDEPENDENT_AMBULATORY_CARE_PROVIDER_SITE_OTHER): Payer: Self-pay | Admitting: Internal Medicine

## 2018-12-16 NOTE — Telephone Encounter (Signed)
Patient will need appointment prior to further refills. She was last seen at time of EGD for solid food dysphagia. Per Dr.Rehman either a OV here or she may get future refills from PCP.

## 2018-12-29 ENCOUNTER — Other Ambulatory Visit (INDEPENDENT_AMBULATORY_CARE_PROVIDER_SITE_OTHER): Payer: Self-pay | Admitting: Internal Medicine

## 2019-01-13 ENCOUNTER — Ambulatory Visit (INDEPENDENT_AMBULATORY_CARE_PROVIDER_SITE_OTHER): Payer: Self-pay | Admitting: Internal Medicine

## 2019-01-23 ENCOUNTER — Encounter (INDEPENDENT_AMBULATORY_CARE_PROVIDER_SITE_OTHER): Payer: Self-pay | Admitting: Internal Medicine

## 2019-01-23 ENCOUNTER — Ambulatory Visit (INDEPENDENT_AMBULATORY_CARE_PROVIDER_SITE_OTHER): Payer: Self-pay | Admitting: Internal Medicine

## 2019-01-23 VITALS — BP 123/80 | HR 70 | Temp 97.8°F | Ht 67.0 in | Wt 228.2 lb

## 2019-01-23 DIAGNOSIS — K5909 Other constipation: Secondary | ICD-10-CM

## 2019-01-23 NOTE — Patient Instructions (Signed)
Continue the Linzess as needed.

## 2019-01-23 NOTE — Progress Notes (Signed)
Subjective:    Patient ID: Brenda [name], female    DOB: Jul 28, 1971, 48 y.o.   MRN: [id number]  HPI Here today for f/u. Last seen in October of 2018 for constipation.  Has tried Amitiza but really was not satisfied with it. I started her on Linzess. She says she has stopped the Linzess and may take it as needed. She says the Linzess made her feel funny. Every once in a while she will do a colon cleanse. She has a BM every 3 days. No melena or BRRB. Her appetite is good. She has gained 3 pounds since 2018. She states she is doing good excellent.   Hx of stomach cancer in mother.   09/05/2017 EGD Impression:               - Granular, texture changed, vertically lined                            mucosa in the esophagus. Biopsied.                           - No endoscopic esophageal abnormality or stricture                            to explain patient's dysphagia. Esophagus dilated.                           - Z-line irregular, 37 cm from the incisors.                           - 2 cm hiatal hernia.                           - Normal stomach.                           - Normal duodenal bulb and second portion of the                            duodenum.    06/22/2004 DG Esophagus: choking sensation IMPRESSION  Somewhat prominent esophageal ampulla without evidence of definite hiatal hernia or reflux. Slight prominence cricopharyngeus muscle, but no Zenker's diverticulum.     Review of Systems Past Medical History:  Diagnosis Date  . Asthma   . GERD (gastroesophageal reflux disease)   . Seasonal allergies     Past Surgical History:  Procedure Laterality Date  . ABDOMINAL HYSTERECTOMY    . BACK SURGERY     3-4 yrs ago.  Marland Kitchen BLADDER SURGERY     incontinence  . BREAST REDUCTION SURGERY Bilateral 08/30/2015   Procedure: BREAST REDUCTION WITH LIPOSUCTION;  Surgeon: [name], MD;  Location: [place];  Service: Plastics;  Laterality: Bilateral;  . EGD/ED      5 yr ago by Dr. Laural Golden  . ESOPHAGEAL DILATION N/A 09/05/2017   Procedure: ESOPHAGEAL DILATION;  Surgeon: Rogene Houston, MD;  [facility];  Service: Endoscopy;  Laterality: N/A;  . ESOPHAGOGASTRODUODENOSCOPY N/A 09/05/2017   Procedure: ESOPHAGOGASTRODUODENOSCOPY (EGD);  Surgeon: Rogene Houston, MD;  [facility];  Service: Endoscopy;  Laterality: N/A;  1:00  .  HIGH RISK BREAST EXCISION Right 2011  . REDUCTION MAMMAPLASTY Bilateral 2016    Allergies  Allergen Reactions  . Other Other (See Comments)    Grass ,  Bed mites,   Dogs  And  Cats.   . Adhesive [Tape] Rash  . Latex Rash    Current Outpatient Medications on File Prior to Visit  Medication Sig Dispense Refill  . albuterol (PROAIR HFA) 108 (90 Base) MCG/ACT inhaler Inhale 2 puffs into the lungs every 4 (four) hours as needed for wheezing or shortness of breath. 8.5 Inhaler 1  . albuterol (PROVENTIL) (2.5 MG/3ML) 0.083% nebulizer solution Use q 4 hrs via nebulizer prn wheezing (Patient taking differently: Take 2.5 mg by nebulization every 4 (four) hours as needed for wheezing. ) 25 vial 0  . ALPRAZolam (XANAX) 0.5 MG tablet May use 1/2 tablet or a whole tablet twice daily when necessary for severe anxiety stress caution drowsiness 20 tablet 0  . beclomethasone (QVAR REDIHALER) 80 MCG/ACT inhaler Inhale 2 puffs into the lungs 2 (two) times daily. 1 Inhaler 5  . cetirizine (ZYRTEC) 10 MG tablet Take 10 mg by mouth as needed for allergies.    Marland Kitchen escitalopram (LEXAPRO) 10 MG tablet Take 1 tablet (10 mg total) by mouth daily. 90 tablet 1  . naproxen (NAPROSYN) 500 MG tablet Take 1 tablet (500 mg total) by mouth 2 (two) times daily with a meal. 20 tablet 0  . oseltamivir (TAMIFLU) 75 MG capsule Take 1 capsule (75 mg total) by mouth 2 (two) times daily. 10 capsule 0  . pantoprazole (PROTONIX) 40 MG tablet TAKE 1 TABLET BY MOUTH DAILY BEFORE BREAKFAST 90 tablet 3   No current facility-administered medications on  file prior to visit.         Objective:   Physical Exam [name] pressure 123/80, pulse 70, temperature 97.8 F (36.6 C), height 5\' 7"  (1.702 m), weight 228 lb 3.2 oz (103.5 kg), last menstrual period 07/06/2004. Alert and oriented. Skin warm and dry. Oral mucosa is moist.   . Sclera anicteric, conjunctivae is pink. Thyroid not enlarged. No cervical lymphadenopathy. Lungs clear. Heart regular rate and rhythm.  Abdomen is soft. Bowel sounds are positive. No hepatomegaly. No abdominal masses felt. No tenderness.  No edema to lower extremities.           Assessment & Plan:  Constipation. She is having a BM every 3 days. Uses Linzess as needed. OV in 1 year.

## 2019-01-24 ENCOUNTER — Ambulatory Visit (INDEPENDENT_AMBULATORY_CARE_PROVIDER_SITE_OTHER): Payer: Self-pay | Admitting: Internal Medicine

## 2019-02-03 ENCOUNTER — Telehealth: Payer: Self-pay | Admitting: Family Medicine

## 2019-02-03 NOTE — Telephone Encounter (Signed)
All antifhis re the same virtuall, maybe otc zyrtec a little stronger 10 mg dail, and can add rx flonase two sprays ea nostril qd three ref

## 2019-02-03 NOTE — Telephone Encounter (Signed)
Contacted patient. Pt states that she picked up the OTC flonase because the pharmacist states that OTC was almost the same as prescription. Pt states she has to sit up at night and is not able to sleep. Informed patient to take Zyrtec 10 mg once daily. Pt verbalized understanding.

## 2019-02-03 NOTE — Telephone Encounter (Signed)
Patient wanted to know if there was anything we could call in for allergies.  She said with the weather changing she goes from congestion to runny nose. Everything is clear, no infection.  Claritin hasn't really helped much.    [facility]

## 2019-02-24 ENCOUNTER — Telehealth: Payer: Self-pay | Admitting: Family Medicine

## 2019-02-24 MED ORDER — ONDANSETRON 4 MG PO TBDP: ORAL_TABLET | ORAL | 0 refills | Status: DC

## 2019-02-24 NOTE — Telephone Encounter (Signed)
Patient states she started feeling nauseous last week, feels like vomiting,but not vomiting, she is constipated.She says her temp normally runs 95-96 and hers was at 98.9. No shortness of breath, no wheezing now ,but did have some wheezing last night,has some non productive cough,throat hurts.   She reports her son was sick last week with his second round of flu last week and she has been out in the public several times this weekend trying to get her groceries.

## 2019-02-24 NOTE — Telephone Encounter (Signed)
Patient states she started feeling nauseous no SOB, patient states starting yesterday her throat started to feel sore as well as a cough, she states her temp is 99 (O). Advise.   Patient states as she's aware of has not been in contact with anyone suspected Covid-19.   Patient has not traveled internationally in past 30 days   Pharmacy:  [id number] [place], [place]

## 2019-02-24 NOTE — Telephone Encounter (Signed)
This is all symtom care only level illness  At this time we do not want folks with mild illness coming in, because they could catch something more severe  Also hi likelihood not the serious type of virus starting to crop up, that is still rare  Explain to her serious symtoms which would warrant trip to er  Call in zofran 4 odt 16 one q six hrs prn nausea

## 2019-02-24 NOTE — Telephone Encounter (Signed)
Patient is aware of all and medication sent to the requested pharmacy.

## 2019-05-28 ENCOUNTER — Telehealth: Payer: Self-pay | Admitting: Family Medicine

## 2019-05-28 DIAGNOSIS — Z13220 Encounter for screening for lipoid disorders: Secondary | ICD-10-CM

## 2019-05-28 DIAGNOSIS — R5383 Other fatigue: Secondary | ICD-10-CM

## 2019-05-28 DIAGNOSIS — Z79899 Other long term (current) drug therapy: Secondary | ICD-10-CM

## 2019-05-28 NOTE — Telephone Encounter (Signed)
Patient has physical on 7/6 and needing labs done.

## 2019-05-29 NOTE — Telephone Encounter (Signed)
Blood work ordered in Epic. Patient notified. 

## 2019-05-29 NOTE — Telephone Encounter (Signed)
Please order same labs as 12/05/17. Diagnosis for vitamin D level: vit D insufficiency. Thanks.

## 2019-06-05 ENCOUNTER — Other Ambulatory Visit: Payer: Self-pay

## 2019-06-06 LAB — TSH: TSH: 1.95 u[IU]/mL (ref 0.450–4.500)

## 2019-06-06 LAB — LIPID PANEL
Chol/HDL Ratio: 3.6 ratio (ref 0.0–4.4)
Cholesterol, Total: 197 mg/dL (ref 100–199)
HDL: 54 mg/dL (ref >39)
LDL Calculated: 117 mg/dL — ABNORMAL HIGH (ref 0–99)
Triglycerides: 130 mg/dL (ref 0–149)
VLDL Cholesterol Cal: 26 mg/dL (ref 5–40)

## 2019-06-06 LAB — VITAMIN D 25 HYDROXY (VIT D DEFICIENCY, FRACTURES): Vit D, 25-Hydroxy: 24.5 ng/mL — ABNORMAL LOW (ref 30.0–100.0)

## 2019-06-06 LAB — BASIC METABOLIC PANEL
BUN/Creatinine Ratio: 8 — ABNORMAL LOW (ref 9–23)
BUN: 7 mg/dL (ref 6–24)
CO2: 24 mmol/L (ref 20–29)
Calcium: 9.6 mg/dL (ref 8.7–10.2)
Chloride: 104 mmol/L (ref 96–106)
Creatinine, Ser: 0.84 mg/dL (ref 0.57–1.00)
GFR calc Af Amer: 94 mL/min/{1.73_m2} (ref >59)
GFR calc non Af Amer: 82 mL/min/{1.73_m2} (ref >59)
Glucose: 97 mg/dL (ref 65–99)
Potassium: 4.4 mmol/L (ref 3.5–5.2)
Sodium: 140 mmol/L (ref 134–144)

## 2019-06-06 LAB — HEPATIC FUNCTION PANEL
ALT: 15 IU/L (ref 0–32)
AST: 16 IU/L (ref 0–40)
Albumin: 4.3 g/dL (ref 3.8–4.8)
Alkaline Phosphatase: 87 IU/L (ref 39–117)
Bilirubin Total: 0.3 mg/dL (ref 0.0–1.2)
Bilirubin, Direct: 0.1 mg/dL (ref 0.00–0.40)
Total Protein: 6.7 g/dL (ref 6.0–8.5)

## 2019-06-09 ENCOUNTER — Encounter: Payer: Self-pay | Admitting: Family Medicine

## 2019-06-09 ENCOUNTER — Ambulatory Visit (INDEPENDENT_AMBULATORY_CARE_PROVIDER_SITE_OTHER): Payer: BC Managed Care – PPO | Admitting: Family Medicine

## 2019-06-09 ENCOUNTER — Other Ambulatory Visit: Payer: Self-pay

## 2019-06-09 VITALS — BP 118/70 | Temp 97.9°F | Ht 67.0 in | Wt 224.6 lb

## 2019-06-09 DIAGNOSIS — Z0000 Encounter for general adult medical examination without abnormal findings: Secondary | ICD-10-CM | POA: Diagnosis not present

## 2019-06-09 MED ORDER — ALBUTEROL SULFATE (2.5 MG/3ML) 0.083% IN NEBU: INHALATION_SOLUTION | RESPIRATORY_TRACT | 5 refills | Status: DC

## 2019-06-09 MED ORDER — QVAR REDIHALER 80 MCG/ACT IN AERB: 2.0000 | INHALATION_SPRAY | Freq: Two times a day (BID) | RESPIRATORY_TRACT | 5 refills | Status: DC

## 2019-06-09 MED ORDER — ALBUTEROL SULFATE HFA 108 (90 BASE) MCG/ACT IN AERS: 2.0000 | INHALATION_SPRAY | RESPIRATORY_TRACT | 5 refills | Status: DC | PRN

## 2019-06-09 NOTE — Progress Notes (Signed)
Subjective:    Patient ID: Brenda [name], female    DOB: 1971/06/30, 48 y.o.   MRN: [id number]  HPI The patient comes in today for a wellness visit. Results for orders placed or performed in visit on 05/28/19  Lipid panel  Result Value Ref Range   Cholesterol, Total 197 100 - 199 mg/dL   Triglycerides 130 0 - 149 mg/dL   HDL 54 >39 mg/dL   VLDL Cholesterol Cal 26 5 - 40 mg/dL   LDL Calculated 117 (H) 0 - 99 mg/dL   Chol/HDL Ratio 3.6 0.0 - 4.4 ratio  Hepatic function panel  Result Value Ref Range   Total Protein 6.7 6.0 - 8.5 g/dL   Albumin 4.3 3.8 - 4.8 g/dL   Bilirubin Total 0.3 0.0 - 1.2 mg/dL   Bilirubin, Direct 0.10 0.00 - 0.40 mg/dL   Alkaline Phosphatase 87 39 - 117 IU/L   AST 16 0 - 40 IU/L   ALT 15 0 - 32 IU/L  Basic metabolic panel  Result Value Ref Range   Glucose 97 65 - 99 mg/dL   BUN 7 6 - 24 mg/dL   Creatinine, Ser 0.84 0.57 - 1.00 mg/dL   GFR calc non Af Amer 82 >59 mL/min/1.73   GFR calc Af Amer 94 >59 mL/min/1.73   BUN/Creatinine Ratio 8 (L) 9 - 23   Sodium 140 134 - 144 mmol/L   Potassium 4.4 3.5 - 5.2 mmol/L   Chloride 104 96 - 106 mmol/L   CO2 24 20 - 29 mmol/L   Calcium 9.6 8.7 - 10.2 mg/dL  VITAMIN D 25 Hydroxy (Vit-D Deficiency, Fractures)  Result Value Ref Range   Vit D, 25-Hydroxy 24.5 (L) 30.0 - 100.0 ng/mL  TSH  Result Value Ref Range   TSH 1.950 0.450 - 4.500 uIU/mL   Takes reg multivit sussp  Daily   mammo due this summer    Colonoscopy  Due this winter       A review of their health history was completed.  A review of medications was also completed.  Any needed refills; albuterol neb solution  Eating habits: health conscious  Falls/  MVA accidents in past few months: none  Regular exercise: every now and then  Specialist pt sees on regular basis: none  Preventative health issues were discussed.   Additional concerns: none. Sees gyn for pelvic exams  shakllee supplement  Review of Systems  Constitutional:  Negative for activity change, appetite change and fatigue.  HENT: Negative for congestion and rhinorrhea.   Eyes: Negative for discharge.  Respiratory: Negative for cough, chest tightness and wheezing.   Cardiovascular: Negative for chest pain.  Gastrointestinal: Negative for abdominal pain, [name] in stool and vomiting.  Endocrine: Negative for polyphagia.  Genitourinary: Negative for difficulty urinating and frequency.  Musculoskeletal: Negative for neck pain.  Skin: Negative for color change.  Allergic/Immunologic: Negative for environmental allergies and food allergies.  Neurological: Negative for weakness and headaches.  Psychiatric/Behavioral: Negative for agitation and behavioral problems.  All other systems reviewed and are negative.      Objective:   Physical Exam Constitutional:      Appearance: She is well-developed.  HENT:     Head: Normocephalic.     Right Ear: External ear normal.     Left Ear: External ear normal.  Eyes:     Pupils: Pupils are equal, round, and reactive to light.  Neck:     Musculoskeletal: Normal range of motion.  Thyroid: No thyromegaly.  Cardiovascular:     Rate and Rhythm: Normal rate and regular rhythm.     Heart sounds: Normal heart sounds. No murmur.  Pulmonary:     Effort: Pulmonary effort is normal. No respiratory distress.     Breath sounds: Normal breath sounds. No wheezing.  Abdominal:     General: Bowel sounds are normal. There is no distension.     Palpations: Abdomen is soft. There is no mass.     Tenderness: There is no abdominal tenderness.  Musculoskeletal: Normal range of motion.        General: No tenderness.  Lymphadenopathy:     Cervical: No cervical adenopathy.  Skin:    General: Skin is warm and dry.  Neurological:     Mental Status: She is alert and oriented to person, place, and time.     Motor: No abnormal muscle tone.  Psychiatric:        Behavior: Behavior normal.           Assessment & Plan:   Impression well adult exam.  Diet discussed.  Exercise discussed.  Vaccines discussed.  Due for colonoscopy next year.  Asthma clinically stable to maintain same therapy.  [name] work discussed

## 2019-07-09 ENCOUNTER — Other Ambulatory Visit: Payer: Self-pay

## 2019-07-09 DIAGNOSIS — Z20822 Contact with and (suspected) exposure to covid-19: Secondary | ICD-10-CM

## 2019-07-09 NOTE — Progress Notes (Signed)
lab

## 2019-07-10 LAB — NOVEL CORONAVIRUS, NAA: SARS-CoV-2, NAA: NOT DETECTED

## 2019-10-13 ENCOUNTER — Other Ambulatory Visit: Payer: Self-pay

## 2019-10-13 DIAGNOSIS — Z20822 Contact with and (suspected) exposure to covid-19: Secondary | ICD-10-CM

## 2019-10-14 ENCOUNTER — Encounter: Payer: Self-pay | Admitting: Family Medicine

## 2019-10-14 ENCOUNTER — Telehealth: Payer: Self-pay | Admitting: Family Medicine

## 2019-10-14 LAB — NOVEL CORONAVIRUS, NAA: SARS-CoV-2, NAA: NOT DETECTED

## 2019-10-14 NOTE — Telephone Encounter (Signed)
Note faxed and pt made aware

## 2019-10-14 NOTE — Telephone Encounter (Signed)
Ok may construct note and fax

## 2019-10-14 NOTE — Telephone Encounter (Signed)
Pt is needing a work note stating she can not return to work due to her son testing positive for COVID. She states they will pay her if she has a doctors note. Her and her husband went and got tested yesterday.

## 2019-10-14 NOTE — Telephone Encounter (Signed)
Please give note then fax and call pt. thanks

## 2019-10-14 NOTE — Telephone Encounter (Signed)
Fax number to send work note is [phone].

## 2019-10-14 NOTE — Telephone Encounter (Signed)
Son tested positive on Saturday and pt missed work Sunday and she states she talked to corporate and  her note needs to start from Sunday and go to nov 22nd which is 14 days.  Pt would like a call back after note has been faxed

## 2019-10-17 IMAGING — XA Imaging study
2 series · 2 of 2 positions shown · non-contrast
Comparison: none

CLINICAL DATA: Lumbosacral spondylosis without myelopathy. Prior
L5-S1 fusion. Degenerative disc disease at L4-L5. Right greater than
left lower back pain with radiculopathy. 25% relief after prior
interlaminar ROSE MARIE on 03/04/2018.

[Series 3: ortho adipose · 1 of 1 slices shown (1 of 2)]
[im 1/1]
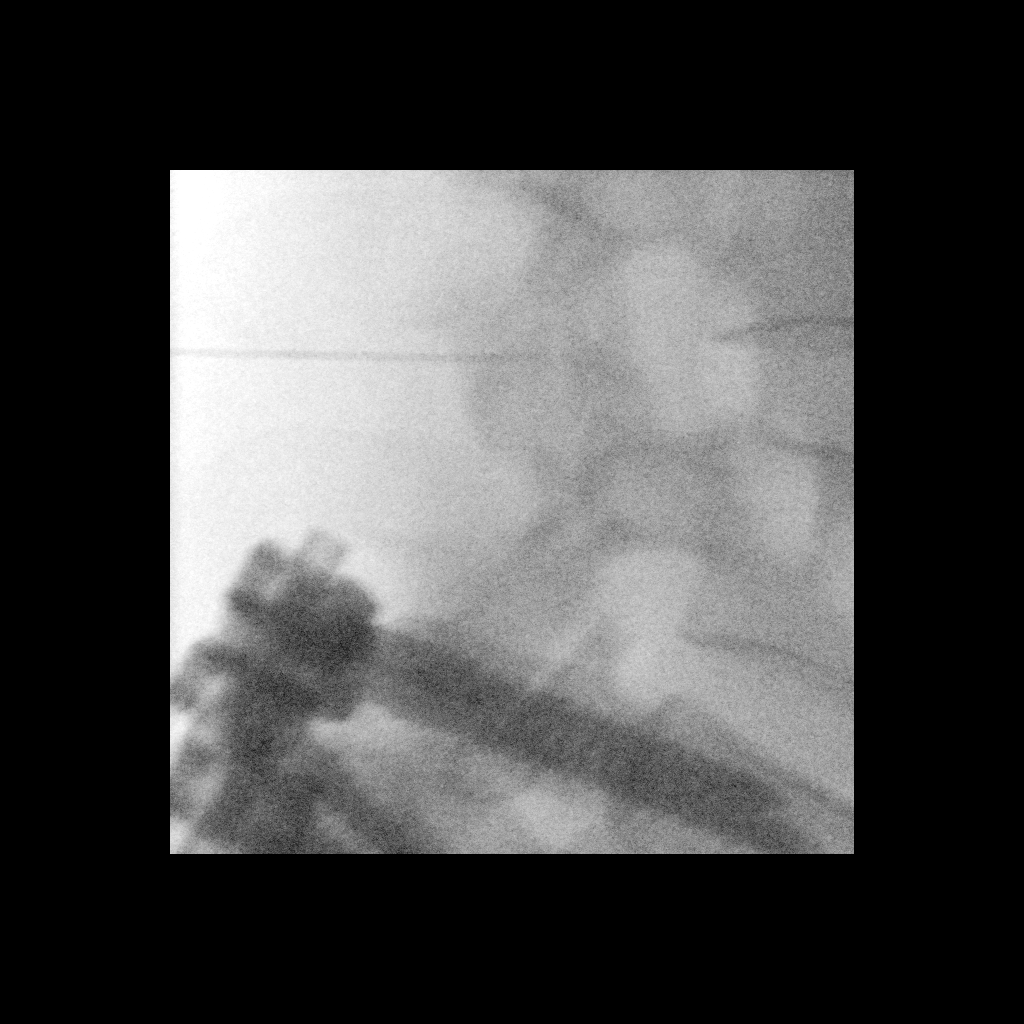

[Series 4: ortho adipose · 1 of 1 slices shown (2 of 2)]
[im 1/1]
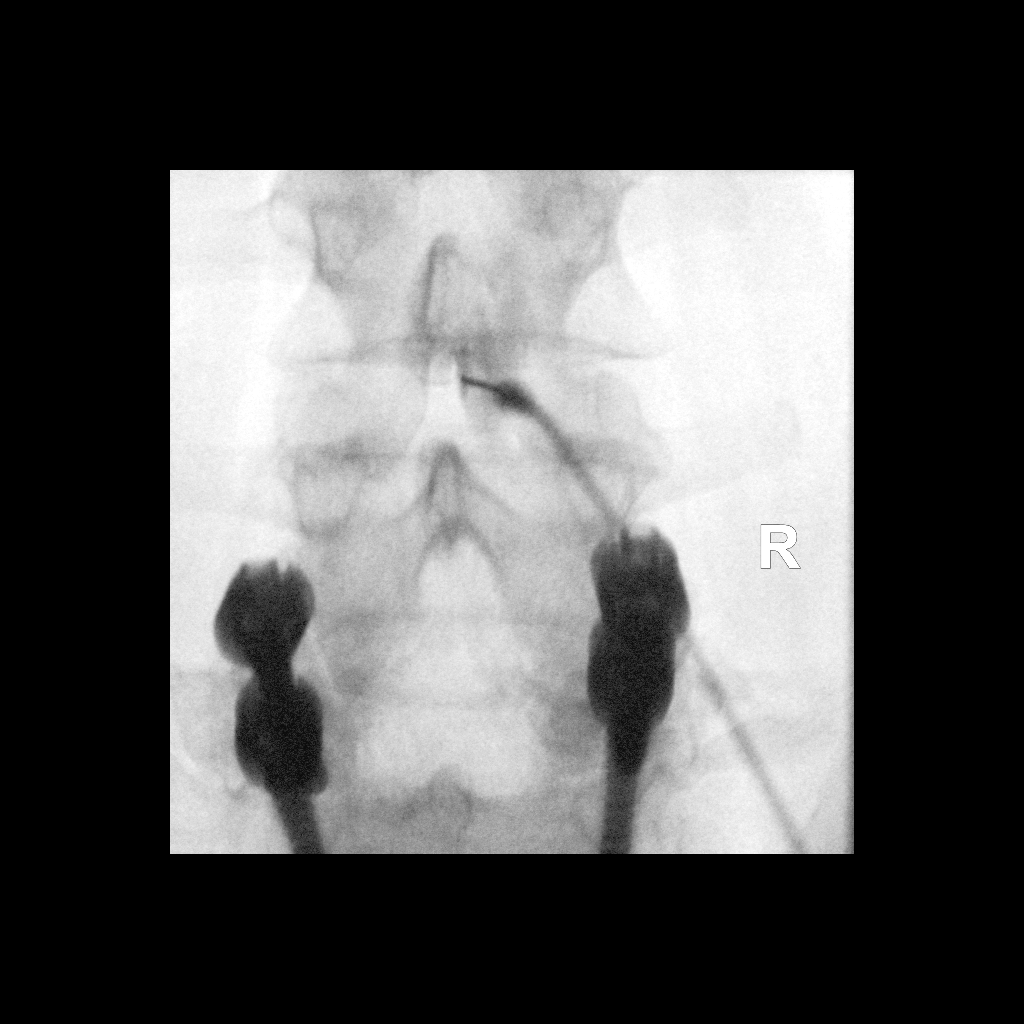

[2 of 2 positions shown; findings below may reference images not displayed]

FLUOROSCOPY TIME:  Radiation Exposure Index (as provided by the
fluoroscopic device): 46.72 Gy*m2

Fluoroscopy Time:  12 seconds

Number of Acquired Images:  0

PROCEDURE:
The procedure, risks, benefits, and alternatives were explained to
the patient. Questions regarding the procedure were encouraged and
answered. The patient understands and consents to the procedure.

LUMBAR EPIDURAL INJECTION:

An interlaminar approach was performed on right at L3-L4. The
overlying skin was cleansed and anesthetized. A 3.5 inch 20 gauge
epidural needle was advanced using loss-of-resistance technique.

DIAGNOSTIC EPIDURAL INJECTION:

Injection of Isovue-M 200 shows a good epidural pattern with spread
above and below the level of needle placement, primarily on the
right. No vascular opacification is seen.

THERAPEUTIC EPIDURAL INJECTION:

120 mg of Depo-Medrol mixed with 3 mL of 1% lidocaine were
instilled. The procedure was well-tolerated, and the patient was
discharged thirty minutes following the injection in good condition.

COMPLICATIONS:
None immediate.
IMPRESSION: Technically successful epidural injection on the right at L3-L4 #2.

## 2019-10-23 ENCOUNTER — Other Ambulatory Visit: Payer: Self-pay

## 2019-10-23 DIAGNOSIS — Z20822 Contact with and (suspected) exposure to covid-19: Secondary | ICD-10-CM

## 2019-10-26 LAB — NOVEL CORONAVIRUS, NAA: SARS-CoV-2, NAA: NOT DETECTED

## 2019-11-04 ENCOUNTER — Encounter (INDEPENDENT_AMBULATORY_CARE_PROVIDER_SITE_OTHER): Payer: Self-pay | Admitting: *Deleted

## 2019-11-17 ENCOUNTER — Ambulatory Visit
Admission: EM | Admit: 2019-11-17 | Discharge: 2019-11-17 | Disposition: A | Discharge status: Home / Self Care | Payer: BC Managed Care – PPO | Attending: Emergency Medicine | Admitting: Emergency Medicine

## 2019-11-17 ENCOUNTER — Other Ambulatory Visit: Payer: Self-pay

## 2019-11-17 DIAGNOSIS — Z20822 Contact with and (suspected) exposure to covid-19: Secondary | ICD-10-CM

## 2019-11-17 DIAGNOSIS — Z20828 Contact with and (suspected) exposure to other viral communicable diseases: Secondary | ICD-10-CM | POA: Diagnosis not present

## 2019-11-17 NOTE — ED Triage Notes (Signed)
Pt presents with sore throat and runny nose and body aches that began Saturday , pt has family that tested positive 2 weeks ago

## 2019-11-17 NOTE — Discharge Instructions (Signed)
COVID testing ordered.  It will take between 5-7 days for test results.  Someone will contact you regarding abnormal results.    In the meantime: You should remain isolated in your home for 10 days from symptom onset  Get plenty of rest and push fluids  Use medications daily for symptom relief Use OTC medications like ibuprofen or tylenol as needed fever or pain Call or go to the ED if you have any new or worsening symptoms such as fever, worsening cough, shortness of breath, chest tightness, chest pain, turning blue, changes in mental status, etc..Marland Kitchen

## 2019-11-17 NOTE — ED Provider Notes (Signed)
[facility]    CSN: [id number] Arrival date & time: 11/17/19  1027      History   Chief Complaint No chief complaint on file.   HPI Brenda [name] is a 48 y.o. female.   Brenda [name] 48 years old female presents to the urgent care with a complaint of sore throat, runny nose, and body aches X 2 days.  Denies sick exposure to COVID, flu or strep.  Denies recent travel.  Denies aggravating or alleviating symptoms.  Denies previous COVID infection.   Denies fever, chills, fatigue, nasal congestion,  cough, SOB, wheezing, chest pain, nausea, vomiting, changes in bowel or bladder habits.       The history is provided by the patient. No language interpreter was used.    Past Medical History:  Diagnosis Date  . Asthma   . GERD (gastroesophageal reflux disease)   . Seasonal allergies     Patient Active Problem List   Diagnosis Date Noted  . Esophageal dysphagia 05/29/2017  . Gastroesophageal reflux disease without esophagitis 05/29/2017  . Atypical ductal hyperplasia of right breast 11/04/2015  . Hot flushes, perimenopausal 11/04/2015  . Morbid obesity ([place]) 04/30/2015  . Chronic upper back pain 04/30/2015  . GERD (gastroesophageal reflux disease) 05/14/2014  . Asthma, chronic obstructive, with acute exacerbation ([place]) 05/08/2014  . Anxiety and depression 04/08/2014  . Allergic rhinitis 04/01/2014  . Reactive airways dysfunction syndrome ([place]) 04/01/2014  . Rhinitis medicamentosa 10/14/2013  . BACK PAIN 05/12/2009  . SPONDYLOLYSIS 05/12/2009  . SPONDYLOLITHESIS 05/12/2009    Past Surgical History:  Procedure Laterality Date  . ABDOMINAL HYSTERECTOMY    . BACK SURGERY     3-4 yrs ago.  Marland Kitchen BLADDER SURGERY     incontinence  . BREAST REDUCTION SURGERY Bilateral 08/30/2015   Procedure: BREAST REDUCTION WITH LIPOSUCTION;  Surgeon: [name], MD;  Location: [place];  Service: Plastics;  Laterality: Bilateral;  . EGD/ED     5 yr ago by Dr.  Laural Golden  . ESOPHAGEAL DILATION N/A 09/05/2017   Procedure: ESOPHAGEAL DILATION;  Surgeon: Rogene Houston, MD;  [facility];  Service: Endoscopy;  Laterality: N/A;  . ESOPHAGOGASTRODUODENOSCOPY N/A 09/05/2017   Procedure: ESOPHAGOGASTRODUODENOSCOPY (EGD);  Surgeon: Rogene Houston, MD;  Location: [facility];  Service: Endoscopy;  Laterality: N/A;  1:00  . HIGH RISK BREAST EXCISION Right 2011  . REDUCTION MAMMAPLASTY Bilateral 2016    OB History   No obstetric history on file.      Home Medications    Prior to Admission medications   Medication Sig Start Date End Date Taking? Authorizing Provider  albuterol (PROAIR HFA) 108 (90 Base) MCG/ACT inhaler Inhale 2 puffs into the lungs every 4 (four) hours as needed for wheezing or shortness of breath. 06/09/19   Brenda Kirschner, MD  albuterol (PROVENTIL) (2.5 MG/3ML) 0.083% nebulizer solution Use q 4 hrs via nebulizer prn wheezing 06/09/19   Brenda Kirschner, MD  beclomethasone (QVAR REDIHALER) 80 MCG/ACT inhaler Inhale 2 puffs into the lungs 2 (two) times daily. 06/09/19   Brenda Kirschner, MD  cetirizine (ZYRTEC) 10 MG tablet Take 10 mg by mouth as needed for allergies.    [provider]  pantoprazole (PROTONIX) 40 MG tablet TAKE 1 TABLET BY MOUTH DAILY BEFORE BREAKFAST 12/30/18   [name], Brenda Dawley, MD    Family History Family History  Problem Relation Age of Onset  . Cancer Mother 3  endometrial cancer and cervical cancer   . Cancer Maternal Grandmother        prostate cancer    Social History Social History   Tobacco Use  . Smoking status: Never Smoker  . Smokeless tobacco: Never Used  Substance Use Topics  . Alcohol use: No    Comment: rarely   . Drug use: No     Allergies   Other, Adhesive [tape], and Latex   Review of Systems Review of Systems  Constitutional: Negative.   HENT: Positive for rhinorrhea and sore throat.   Respiratory: Negative.   Cardiovascular: Negative.     Gastrointestinal: Negative.   Musculoskeletal:       Body-aches  Neurological: Negative.      Physical Exam Triage Vital Signs ED Triage Vitals  Enc Vitals Group     BP 11/17/19 1052 139/86     Pulse Rate 11/17/19 1052 95     Resp 11/17/19 1052 18     Temp 11/17/19 1052 98 F (36.7 C)     Temp src --      SpO2 11/17/19 1052 96 %     Weight --      Height --      Head Circumference --      Peak Flow --      Pain Score 11/17/19 1048 2     Pain Loc --      Pain Edu? --      Excl. in [place]? --    No data found.  Updated Vital Signs BP 139/86   Pulse 95   Temp 98 F (36.7 C)   Resp 18   LMP 07/06/2004   SpO2 96%   Visual Acuity Right Eye Distance:   Left Eye Distance:   Bilateral Distance:    Right Eye Near:   Left Eye Near:    Bilateral Near:     Physical Exam Constitutional:      General: She is not in acute distress.    Appearance: Normal appearance. She is normal weight. She is not ill-appearing or toxic-appearing.  HENT:     Head: Normocephalic.     Right Ear: Tympanic membrane, ear canal and external ear normal. There is no impacted cerumen.     Left Ear: Ear canal and external ear normal. There is no impacted cerumen.     Nose: Nose normal. No congestion.     Mouth/Throat:     Mouth: Mucous membranes are moist.     Pharynx: No oropharyngeal exudate or posterior oropharyngeal erythema.  Cardiovascular:     Rate and Rhythm: Normal rate and regular rhythm.     Pulses: Normal pulses.     Heart sounds: Normal heart sounds. No murmur.  Pulmonary:     Effort: Pulmonary effort is normal. No respiratory distress.     Breath sounds: No wheezing or rhonchi.  Chest:     Chest wall: No tenderness.  Abdominal:     General: Abdomen is flat. Bowel sounds are normal. There is no distension.     Palpations: There is no mass.  Musculoskeletal:        General: Normal range of motion.  Skin:    Capillary Refill: Capillary refill takes less than 2 seconds.   Neurological:     Mental Status: She is alert and oriented to person, place, and time.      UC Treatments / Results  Labs (all labs ordered are listed, but only abnormal results are displayed) Labs Reviewed  NOVEL CORONAVIRUS, NAA    EKG   Radiology No results found.  Procedures Procedures (including critical care time)  Medications Ordered in UC Medications - No data to display  Initial Impression / Assessment and Plan / UC Course  I have reviewed the triage vital signs and the nursing notes.  Pertinent labs & imaging results that were available during my care of the patient were reviewed by me and considered in my medical decision making (see chart for details).     Patient stable for discharge.  Benign physical exam.  Covid test was ordered and patient will be called  if result is abnormal. Final Clinical Impressions(s) / UC Diagnoses   Final diagnoses:  Suspected COVID-19 virus infection   Discharge Instructions   None    ED Prescriptions    None     PDMP not reviewed this encounter.   Emerson Monte, FNP 11/17/19 1113

## 2019-11-18 ENCOUNTER — Encounter: Payer: Self-pay | Admitting: Family Medicine

## 2019-11-18 ENCOUNTER — Ambulatory Visit (INDEPENDENT_AMBULATORY_CARE_PROVIDER_SITE_OTHER): Payer: BC Managed Care – PPO | Admitting: Family Medicine

## 2019-11-18 DIAGNOSIS — J4520 Mild intermittent asthma, uncomplicated: Secondary | ICD-10-CM | POA: Diagnosis not present

## 2019-11-18 DIAGNOSIS — J329 Chronic sinusitis, unspecified: Secondary | ICD-10-CM

## 2019-11-18 DIAGNOSIS — J310 Chronic rhinitis: Secondary | ICD-10-CM | POA: Diagnosis not present

## 2019-11-18 LAB — NOVEL CORONAVIRUS, NAA: SARS-CoV-2, NAA: NOT DETECTED

## 2019-11-18 MED ORDER — ALBUTEROL SULFATE (2.5 MG/3ML) 0.083% IN NEBU: INHALATION_SOLUTION | RESPIRATORY_TRACT | 0 refills | Status: DC

## 2019-11-18 MED ORDER — CEFPROZIL 500 MG PO TABS: 500.0000 mg | ORAL_TABLET | Freq: Two times a day (BID) | ORAL | 0 refills | Status: DC

## 2019-11-18 MED ORDER — FLOVENT HFA 44 MCG/ACT IN AERO: 2.0000 | INHALATION_SPRAY | Freq: Two times a day (BID) | RESPIRATORY_TRACT | 5 refills | Status: DC

## 2019-11-18 NOTE — Progress Notes (Signed)
   Subjective:    Patient ID: Brenda [name], female    DOB: March 17, 1971, 48 y.o.   MRN: [id number]  Sinusitis This is a new problem. Episode onset: 5 days. Associated symptoms include congestion and a sore throat. Treatments tried: alka seltzer cold and flu.  went to urgent care yesterday and had covid test done.  Cough started today.   Insurance will not pay for qvar.   Needs refill on albuterol solution for neb machine.   Virtual Visit via Telephone Note  I connected with Brenda [name] on 11/18/19 at  3:50 PM EST by telephone and verified that I am speaking with the correct person using two identifiers.  Location: Patient: home Provider: office   I discussed the limitations, risks, security and privacy concerns of performing an evaluation and management service by telephone and the availability of in person appointments. I also discussed with the patient that there may be a patient responsible charge related to this service. The patient expressed understanding and agreed to proceed.   History of Present Illness:    Observations/Objective:   Assessment and Plan:   Follow Up Instructions:    I discussed the assessment and treatment plan with the patient. The patient was provided an opportunity to ask questions and all were answered. The patient agreed with the plan and demonstrated an understanding of the instructions.   The patient was advised to call back or seek an in-person evaluation if the symptoms worsen or if the condition fails to improve as anticipated.  I provided 17 minutes of non-face-to-face time during this encounter. Audio only  Asthma has not flared up particularly bad with this yet.  Frontal headache positive nasal congestion.  Some wheezing.  But not serious.  No obvious fever.  Exposed to many with true COVID-19.  Patient had testing yesterday.  This is still pending    Review of Systems  HENT: Positive for congestion and sore throat.          Objective:   Physical Exam  Virtual      Assessment & Plan:  Impression rhinosinusitis/bronchitis slight exacerbation asthma.  Suspected COVID-19.  Warning signs discussed.  Precautions discussed.  Antibiotics prescribed.  No steroids rationale discussed.  Albuterol as needed.

## 2019-11-20 ENCOUNTER — Encounter: Payer: Self-pay | Admitting: Family Medicine

## 2019-12-23 ENCOUNTER — Encounter: Payer: Self-pay | Admitting: Family Medicine

## 2019-12-24 NOTE — Telephone Encounter (Signed)
[name], MD  You 13 minutes ago (9:01 AM)   This is called raynaud's phenomenon or raynaud's disease. Look it up and read about. If you would like to discuss further plz schedule an office visit

## 2020-01-07 ENCOUNTER — Encounter: Payer: Self-pay | Admitting: Family Medicine

## 2020-01-08 ENCOUNTER — Encounter: Payer: Self-pay | Admitting: Family Medicine

## 2020-01-08 MED ORDER — TRIAMCINOLONE ACETONIDE 0.1 % EX CREA: TOPICAL_CREAM | CUTANEOUS | 2 refills | Status: DC

## 2020-01-22 ENCOUNTER — Ambulatory Visit (INDEPENDENT_AMBULATORY_CARE_PROVIDER_SITE_OTHER): Payer: Self-pay | Admitting: Gastroenterology

## 2020-01-26 ENCOUNTER — Other Ambulatory Visit: Payer: Self-pay

## 2020-01-26 ENCOUNTER — Encounter (INDEPENDENT_AMBULATORY_CARE_PROVIDER_SITE_OTHER): Payer: Self-pay | Admitting: Gastroenterology

## 2020-01-26 ENCOUNTER — Ambulatory Visit (INDEPENDENT_AMBULATORY_CARE_PROVIDER_SITE_OTHER): Payer: Self-pay | Admitting: Gastroenterology

## 2020-01-26 ENCOUNTER — Ambulatory Visit (INDEPENDENT_AMBULATORY_CARE_PROVIDER_SITE_OTHER): Payer: 59 | Admitting: Gastroenterology

## 2020-01-26 VITALS — BP 117/70 | HR 77 | Temp 97.9°F | Ht 67.0 in | Wt 221.8 lb

## 2020-01-26 DIAGNOSIS — K5909 Other constipation: Secondary | ICD-10-CM | POA: Diagnosis not present

## 2020-01-26 DIAGNOSIS — K219 Gastro-esophageal reflux disease without esophagitis: Secondary | ICD-10-CM | POA: Diagnosis not present

## 2020-01-26 DIAGNOSIS — R1310 Dysphagia, unspecified: Secondary | ICD-10-CM

## 2020-01-26 DIAGNOSIS — Z1211 Encounter for screening for malignant neoplasm of colon: Secondary | ICD-10-CM

## 2020-01-26 MED ORDER — PANTOPRAZOLE SODIUM 40 MG PO TBEC: 40.0000 mg | DELAYED_RELEASE_TABLET | Freq: Every day | ORAL | 3 refills | Status: DC

## 2020-01-26 NOTE — Progress Notes (Signed)
Patient profile: Brenda [name] is a 49 y.o. female seen for evaluation 01/2019 for yearly f/up of constipation   History of Present Illness: Brenda [name] is seen today for yearly follow-up of GERD and constipation.  She reports that overall she is doing fairly well.  She previously was taking Linzess once every few days but she is not taking this any longer.  She is using an apple cider gummy that she feels does help her constipation.  A lot of times will go daily, occasionally will have up to a week between stools, she does a "colon cleanse" similar to a miralax bowel prep 2-3 times a year.  Has some bloating with constipation but denies any abdominal pain.  No rectal bleeding.  Feels her appetite is good.  GERD is usually well controlled as long she takes Protonix 40mg  qAM, she does have some occasional breakthrough symptoms if has alcohol.  This is fairly infrequent.  She denies any nausea vomiting.  We did discuss dysphagia in detail today-she has some liquids dysphagia, can occur with tea and water and also rare solids dysphagia.  Slightly improved after dilation 2018 but did not resolve.  Also feels she clears her throat a lot and has "swollen tonsils" and she feels this may contribute to her dysphagia. Trying to loose weight    Wt Readings from Last 3 Encounters:  01/26/20 221 lb 12.8 oz (100.6 kg)  06/09/19 224 lb 9.6 oz (101.9 kg)  01/23/19 228 lb 3.2 oz (103.5 kg)     Last Colonoscopy: none prior Last Endoscopy: 2018-Granular, texture changed, vertically lined mucosa in the esophagus. Biopsied. - No endoscopic esophageal abnormality or strictureto explain patient's dysphagia. Esophagus dilate - Z-line irregular, 37 cm from the incisors.  2 cm hiatal hernia. - Normal stomach Normal duodenal bulb and second portion of the  duodenum   Past Medical History:  Past Medical History:  Diagnosis Date  . Asthma   . GERD (gastroesophageal reflux  disease)   . Seasonal allergies     Problem List: Patient Active Problem List   Diagnosis Date Noted  . Esophageal dysphagia 05/29/2017  . Gastroesophageal reflux disease without esophagitis 05/29/2017  . Atypical ductal hyperplasia of right breast 11/04/2015  . Hot flushes, perimenopausal 11/04/2015  . Morbid obesity ([place]) 04/30/2015  . Chronic upper back pain 04/30/2015  . GERD (gastroesophageal reflux disease) 05/14/2014  . Asthma, chronic obstructive, with acute exacerbation ([place]) 05/08/2014  . Anxiety and depression 04/08/2014  . Allergic rhinitis 04/01/2014  . Reactive airways dysfunction syndrome ([place]) 04/01/2014  . Rhinitis medicamentosa 10/14/2013  . BACK PAIN 05/12/2009  . SPONDYLOLYSIS 05/12/2009  . SPONDYLOLITHESIS 05/12/2009    Past Surgical History: Past Surgical History:  Procedure Laterality Date  . ABDOMINAL HYSTERECTOMY    . BACK SURGERY     3-4 yrs ago.  Marland Kitchen BLADDER SURGERY     incontinence  . BREAST REDUCTION SURGERY Bilateral 08/30/2015   Procedure: BREAST REDUCTION WITH LIPOSUCTION;  Surgeon: [name], MD;  Location: [facility];  Service: Plastics;  Laterality: Bilateral;  . EGD/ED     5 yr ago by Dr. Laural Golden  . ESOPHAGEAL DILATION N/A 09/05/2017   Procedure: ESOPHAGEAL DILATION;  Surgeon: Rogene Houston, MD;  [facility];  Service: Endoscopy;  Laterality: N/A;  . ESOPHAGOGASTRODUODENOSCOPY N/A 09/05/2017   Procedure: ESOPHAGOGASTRODUODENOSCOPY (EGD);  Surgeon: Rogene Houston, MD;  [facility];  Service: Endoscopy;  Laterality: N/A;  1:00  . HIGH  RISK BREAST EXCISION Right 2011  . REDUCTION MAMMAPLASTY Bilateral 2016    Allergies: Allergies  Allergen Reactions  . Other Other (See Comments)    Grass ,  Bed mites,   Dogs  And  Cats.   . Adhesive [Tape] Rash  . Latex Rash      Home Medications:  Current Outpatient Medications:  .  albuterol (PROAIR HFA) 108 (90 Base) MCG/ACT inhaler, Inhale 2 puffs  into the lungs every 4 (four) hours as needed for wheezing or shortness of breath., Disp: 8.5 g, Rfl: 5 .  albuterol (PROVENTIL) (2.5 MG/3ML) 0.083% nebulizer solution, Use q 4 hrs via nebulizer prn wheezing, Disp: 75 mL, Rfl: 0 .  APPLE CIDER VINEGAR PO, Take by mouth 4 (four) times daily., Disp: , Rfl:  .  Ascorbic Acid (VITAMIN C) 500 MG CHEW, Chew by mouth in the morning and at bedtime., Disp: , Rfl:  .  B Complex Vitamins (VITAMIN B COMPLEX PO), Take by mouth daily., Disp: , Rfl:  .  cetirizine (ZYRTEC) 10 MG tablet, Take 10 mg by mouth as needed for allergies., Disp: , Rfl:  .  Cholecalciferol (VITAMIN D3) 50 MCG (2000 UT) TABS, Take 2,000 Units by mouth daily., Disp: , Rfl:  .  fluticasone (FLOVENT HFA) 44 MCG/ACT inhaler, Inhale 2 puffs into the lungs 2 (two) times daily., Disp: 1 Inhaler, Rfl: 5 .  pantoprazole (PROTONIX) 40 MG tablet, Take 1 tablet (40 mg total) by mouth daily. Take 30 min before breakfast, Disp: 90 tablet, Rfl: 3 .  triamcinolone cream (KENALOG) 0.1 %, Apply to affected area BID, Disp: 30 g, Rfl: 2 .  ZINC OXIDE PO, Take by mouth daily., Disp: , Rfl:    Family History: family history includes Cancer in her maternal grandmother; Cancer (age of onset: 79) in her mother.   Per patient - mother had "colon rupture" but denies family hx colon polyps or colon cancer    Social History:   reports that she has never smoked. She has never used smokeless tobacco. She reports that she does not drink alcohol or use drugs.   Review of Systems: Constitutional: Denies weight loss/weight gain  Eyes: No changes in vision. ENT: No oral lesions, sore throat.  GI: see HPI.  Heme/Lymph: No easy bruising.  CV: No chest pain.  GU: No hematuria.  Integumentary: No rashes.  Neuro: No headaches.  Psych: No depression/anxiety.  Endocrine: No heat/cold intolerance.  Allergic/Immunologic: No urticaria.  Resp: No cough, SOB.  Musculoskeletal: No joint swelling.    Physical  Examination: BP 117/70 (BP Location: Right Arm, Patient Position: Sitting, Cuff Size: Large)   Pulse 77   Temp 97.9 F (36.6 C) (Temporal)   Ht 5\' 7"  (1.702 m)   Wt 221 lb 12.8 oz (100.6 kg)   LMP 07/06/2004   BMI 34.74 kg/m  Gen: NAD, alert and oriented x 4 HEENT: PEERLA, EOMI, Neck: supple, no JVD Chest: CTA bilaterally, no wheezes, crackles, or other adventitious sounds CV: RRR, no m/g/c/r Abd: soft, NT, ND, +BS in all four quadrants; no HSM, guarding, ridigity, or rebound tenderness Ext: no edema, well perfused with 2+ pulses, Skin: no rash or lesions noted on observed skin Lymph: no noted LAD  Data Reviewed:   July 2020 labs reviewed-LFTs normal, BMP normal.  Vitamin D 24, TSH normal.  Assessment/Plan: Ms. Ener is a 49 y.o. female   1.  Chronic constipation-improved and no longer needing Linzess as needed.  Can move her stools daily or  have up to a week between stools, we did discuss starting daily MiraLAX to prevent long bouts of constipation.  Also reviewed needs to be moving stools well prior to colonoscopy prep.  2.  Colon cancer screening-no prior colonoscopy.  Due based on age.  Denies family history of colon polyps or colon cancer. Order for colonoscopy to be placed   3.  GERD-well-controlled daily Protonix will continue.  Diet modifications discussed.  Occasional alcohol.  Very rare NSAIDs.  4.  Dysphagia-this mainly seems like liquids dysphagia and was ongoing in 2018.  She will watch and see if she developed solid dysphagia she will contact me and would recommend adding on endoscopy to schedule colonoscopy. Defers EGD at this time.   Patient denies CP, SOB, and use of blood thinners. I discussed the risks and benefits of procedure including bleeding, perforation, infection, missed lesions, medication reactions and possible hospitalization or surgery if complications. All questions answered.   Lamara was seen today for follow-up.  Diagnoses and all orders for  this visit:  Colon cancer screening  Chronic GERD  Dysphagia, unspecified type  Chronic constipation  Other orders -     pantoprazole (PROTONIX) 40 MG tablet; Take 1 tablet (40 mg total) by mouth daily. Take 30 min before breakfast       I personally performed the service, non-incident to. (WP)  Laurine Blazer, [facility]

## 2020-01-26 NOTE — Patient Instructions (Addendum)
The code we are using to schedule colonoscopy is Z12.11 if you want to check with insurance   We refilled protonix to pharmacy. Try miralax once a day - 17grams - can increase or decrease as needed   GERD instructions: -Please avoid lying flat within 2 to 3 hours of eating, this will make reflux symptoms worse. -Some patients find elevating the head of the bed beneficial. -Avoid spicy greasy foods as well as caffeine, coffee, sodas-these food/drinks can worsen heartburn and reflux. -Tobacco will worsen reflux, please try to decrease/eliminate tobacco intake if applicable. -Avoid NSAID products (ibuprofen, aspirin, Advil, Aleve, Goody's, BCs, Alka-Seltzer) - if needing these occasionally please try to take with meal or snack to decrease stomach irritation. -If taking medication for reflux such as prilosec, nexium, aciphex, dexilant, prevacid - take 20-30 minutes before a meal for maximum effectiveness.

## 2020-01-27 ENCOUNTER — Other Ambulatory Visit (INDEPENDENT_AMBULATORY_CARE_PROVIDER_SITE_OTHER): Payer: Self-pay | Admitting: *Deleted

## 2020-01-27 ENCOUNTER — Encounter (INDEPENDENT_AMBULATORY_CARE_PROVIDER_SITE_OTHER): Payer: Self-pay | Admitting: *Deleted

## 2020-01-27 ENCOUNTER — Telehealth (INDEPENDENT_AMBULATORY_CARE_PROVIDER_SITE_OTHER): Payer: Self-pay | Admitting: *Deleted

## 2020-01-27 DIAGNOSIS — Z1211 Encounter for screening for malignant neoplasm of colon: Secondary | ICD-10-CM

## 2020-01-27 MED ORDER — PLENVU 140 G PO SOLR: 1.0000 | Freq: Once | ORAL | 0 refills | Status: AC

## 2020-01-27 NOTE — Telephone Encounter (Signed)
Patient needs plenvu (copay card) TCS sch'd 3/29

## 2020-02-11 ENCOUNTER — Other Ambulatory Visit (INDEPENDENT_AMBULATORY_CARE_PROVIDER_SITE_OTHER): Payer: Self-pay | Admitting: *Deleted

## 2020-02-26 ENCOUNTER — Other Ambulatory Visit (INDEPENDENT_AMBULATORY_CARE_PROVIDER_SITE_OTHER): Payer: Self-pay | Admitting: *Deleted

## 2020-02-27 ENCOUNTER — Other Ambulatory Visit (HOSPITAL_COMMUNITY): Payer: 59

## 2020-02-27 ENCOUNTER — Other Ambulatory Visit: Payer: Self-pay

## 2020-02-27 ENCOUNTER — Other Ambulatory Visit (HOSPITAL_COMMUNITY)
Admission: RE | Admit: 2020-02-27 | Discharge: 2020-02-27 | Disposition: A | Discharge status: Home / Self Care | Payer: 59 | Source: Ambulatory Visit | Attending: Internal Medicine | Admitting: Internal Medicine

## 2020-02-27 DIAGNOSIS — Z20822 Contact with and (suspected) exposure to covid-19: Secondary | ICD-10-CM | POA: Insufficient documentation

## 2020-02-27 DIAGNOSIS — Z01812 Encounter for preprocedural laboratory examination: Secondary | ICD-10-CM | POA: Diagnosis not present

## 2020-02-28 LAB — SARS CORONAVIRUS 2 (TAT 6-24 HRS): SARS Coronavirus 2: NEGATIVE

## 2020-03-01 ENCOUNTER — Encounter (HOSPITAL_COMMUNITY): Payer: Self-pay | Admitting: Internal Medicine

## 2020-03-01 ENCOUNTER — Other Ambulatory Visit: Payer: Self-pay

## 2020-03-01 ENCOUNTER — Ambulatory Visit (HOSPITAL_COMMUNITY)
Admission: RE | Admit: 2020-03-01 | Discharge: 2020-03-01 | Disposition: A | Discharge status: Home / Self Care | Payer: 59 | Source: Ambulatory Visit | Attending: Internal Medicine | Admitting: Internal Medicine

## 2020-03-01 ENCOUNTER — Encounter (HOSPITAL_COMMUNITY)
Admission: RE | Disposition: A | Discharge status: Home / Self Care | Payer: Self-pay | Source: Ambulatory Visit | Attending: Internal Medicine

## 2020-03-01 DIAGNOSIS — Z888 Allergy status to other drugs, medicaments and biological substances status: Secondary | ICD-10-CM | POA: Diagnosis not present

## 2020-03-01 DIAGNOSIS — Z1211 Encounter for screening for malignant neoplasm of colon: Secondary | ICD-10-CM | POA: Insufficient documentation

## 2020-03-01 DIAGNOSIS — J45909 Unspecified asthma, uncomplicated: Secondary | ICD-10-CM | POA: Insufficient documentation

## 2020-03-01 DIAGNOSIS — K5909 Other constipation: Secondary | ICD-10-CM | POA: Diagnosis not present

## 2020-03-01 DIAGNOSIS — Z9109 Other allergy status, other than to drugs and biological substances: Secondary | ICD-10-CM | POA: Diagnosis not present

## 2020-03-01 DIAGNOSIS — Z91048 Other nonmedicinal substance allergy status: Secondary | ICD-10-CM | POA: Diagnosis not present

## 2020-03-01 DIAGNOSIS — Z91040 Latex allergy status: Secondary | ICD-10-CM | POA: Insufficient documentation

## 2020-03-01 DIAGNOSIS — Z8049 Family history of malignant neoplasm of other genital organs: Secondary | ICD-10-CM | POA: Insufficient documentation

## 2020-03-01 DIAGNOSIS — D120 Benign neoplasm of cecum: Secondary | ICD-10-CM | POA: Insufficient documentation

## 2020-03-01 DIAGNOSIS — Z7951 Long term (current) use of inhaled steroids: Secondary | ICD-10-CM | POA: Diagnosis not present

## 2020-03-01 DIAGNOSIS — Z8042 Family history of malignant neoplasm of prostate: Secondary | ICD-10-CM | POA: Insufficient documentation

## 2020-03-01 DIAGNOSIS — Z79899 Other long term (current) drug therapy: Secondary | ICD-10-CM | POA: Diagnosis not present

## 2020-03-01 DIAGNOSIS — Z90710 Acquired absence of both cervix and uterus: Secondary | ICD-10-CM | POA: Diagnosis not present

## 2020-03-01 DIAGNOSIS — K219 Gastro-esophageal reflux disease without esophagitis: Secondary | ICD-10-CM | POA: Insufficient documentation

## 2020-03-01 HISTORY — PX: BIOPSY: SHX5522

## 2020-03-01 HISTORY — PX: COLONOSCOPY: SHX5424

## 2020-03-01 SURGERY — COLONOSCOPY
Anesthesia: Moderate Sedation

## 2020-03-01 MED ORDER — SODIUM CHLORIDE 0.9 % IV SOLN: INTRAVENOUS | Status: DC

## 2020-03-01 MED ORDER — STERILE WATER FOR IRRIGATION IR SOLN: Status: DC | PRN

## 2020-03-01 MED ORDER — MIDAZOLAM HCL 5 MG/5ML IJ SOLN
INTRAMUSCULAR | Status: AC
  Filled 2020-03-01: qty 10

## 2020-03-01 MED ORDER — MEPERIDINE HCL 50 MG/ML IJ SOLN
INTRAMUSCULAR | Status: DC | PRN
  Administered 2020-03-01 (×2): 25 mg

## 2020-03-01 MED ORDER — MIDAZOLAM HCL 5 MG/5ML IJ SOLN
INTRAMUSCULAR | Status: DC | PRN
  Administered 2020-03-01 (×4): 2 mg via INTRAVENOUS

## 2020-03-01 MED ORDER — MEPERIDINE HCL 50 MG/ML IJ SOLN
INTRAMUSCULAR | Status: AC
  Filled 2020-03-01: qty 1

## 2020-03-01 NOTE — Discharge Instructions (Signed)
Colonoscopy, Adult, Care After This sheet gives you information about how to care for yourself after your procedure. Your health care provider may also give you more specific instructions. If you have problems or questions, contact your health care provider. What can I expect after the procedure? After the procedure, it is common to have:  A small amount of blood in your stool for 24 hours after the procedure.  Some gas.  Mild cramping or bloating of your abdomen. Follow these instructions at home: Eating and drinking   Drink enough fluid to keep your urine pale yellow.  Follow instructions from your health care provider about eating or drinking restrictions.  Resume your normal diet as instructed by your health care provider. Avoid heavy or fried foods that are hard to digest. Activity  Rest as told by your health care provider.  Avoid sitting for a long time without moving. Get up to take short walks every 1-2 hours. This is important to improve blood flow and breathing. Ask for help if you feel weak or unsteady.  Return to your normal activities as told by your health care provider. Ask your health care provider what activities are safe for you. Managing cramping and bloating   Try walking around when you have cramps or feel bloated.  Apply heat to your abdomen as told by your health care provider. Use the heat source that your health care provider recommends, such as a moist heat pack or a heating pad. ? Place a towel between your skin and the heat source. ? Leave the heat on for 20-30 minutes. ? Remove the heat if your skin turns bright red. This is especially important if you are unable to feel pain, heat, or cold. You may have a greater risk of getting burned. General instructions  For the first 24 hours after the procedure: ? Do not drive or use machinery. ? Do not sign important documents. ? Do not drink alcohol. ? Do your regular daily activities at a slower pace  than normal. ? Eat soft foods that are easy to digest.  Take over-the-counter and prescription medicines only as told by your health care provider.  Keep all follow-up visits as told by your health care provider. This is important. Contact a health care provider if:  You have blood in your stool 2-3 days after the procedure. Get help right away if you have:  More than a small spotting of blood in your stool.  Large blood clots in your stool.  Swelling of your abdomen.  Nausea or vomiting.  A fever.  Increasing pain in your abdomen that is not relieved with medicine. Summary  After the procedure, it is common to have a small amount of blood in your stool. You may also have mild cramping and bloating of your abdomen.  For the first 24 hours after the procedure, do not drive or use machinery, sign important documents, or drink alcohol.  Get help right away if you have a lot of blood in your stool, nausea or vomiting, a fever, or increased pain in your abdomen. This information is not intended to replace advice given to you by your health care provider. Make sure you discuss any questions you have with your health care provider. Document Revised: 06/16/2019 Document Reviewed: 06/16/2019 Elsevier Patient Education  [place]. Colon Polyps  Polyps are tissue growths inside the body. Polyps can grow in many places, including the large intestine (colon). A polyp may be a round bump or  a mushroom-shaped growth. You could have one polyp or several. Most colon polyps are noncancerous (benign). However, some colon polyps can become cancerous over time. Finding and removing the polyps early can help prevent this. What are the causes? The exact cause of colon polyps is not known. What increases the risk? You are more likely to develop this condition if you:  Have a family history of colon cancer or colon polyps.  Are older than 107 or older than 45 if you are African  American.  Have inflammatory bowel disease, such as ulcerative colitis or Crohn's disease.  Have certain hereditary conditions, such as: ? Familial adenomatous polyposis. ? Lynch syndrome. ? Turcot syndrome. ? Peutz-Jeghers syndrome.  Are overweight.  Smoke cigarettes.  Do not get enough exercise.  Drink too much alcohol.  Eat a diet that is high in fat and red meat and low in fiber.  Had childhood cancer that was treated with abdominal radiation. What are the signs or symptoms? Most polyps do not cause symptoms. If you have symptoms, they may include:  Blood coming from your rectum when having a bowel movement.  Blood in your stool. The stool may look dark red or black.  Abdominal pain.  A change in bowel habits, such as constipation or diarrhea. How is this diagnosed? This condition is diagnosed with a colonoscopy. This is a procedure in which a lighted, flexible scope is inserted into the anus and then passed into the colon to examine the area. Polyps are sometimes found when a colonoscopy is done as part of routine cancer screening tests. How is this treated? Treatment for this condition involves removing any polyps that are found. Most polyps can be removed during a colonoscopy. Those polyps will then be tested for cancer. Additional treatment may be needed depending on the results of testing. Follow these instructions at home: Lifestyle  Maintain a healthy weight, or lose weight if recommended by your health care provider.  Exercise every day or as told by your health care provider.  Do not use any products that contain nicotine or tobacco, such as cigarettes and e-cigarettes. If you need help quitting, ask your health care provider.  If you drink alcohol, limit how much you have: ? 0-1 drink a day for women. ? 0-2 drinks a day for men.  Be aware of how much alcohol is in your drink. In the [place] one drink equals one 12 oz bottle of beer (355 mL), one 5 oz glass  of wine (148 mL), or one 1 oz shot of hard liquor (44 mL). Eating and drinking   Eat foods that are high in fiber, such as fruits, vegetables, and whole grains.  Eat foods that are high in calcium and vitamin D, such as milk, cheese, yogurt, eggs, liver, fish, and broccoli.  Limit foods that are high in fat, such as fried foods and desserts.  Limit the amount of red meat and processed meat you eat, such as hot dogs, sausage, bacon, and lunch meats. General instructions  Keep all follow-up visits as told by your health care provider. This is important. ? This includes having regularly scheduled colonoscopies. ? Talk to your health care provider about when you need a colonoscopy. Contact a health care provider if:  You have new or worsening bleeding during a bowel movement.  You have new or increased blood in your stool.  You have a change in bowel habits.  You lose weight for no known reason. Summary  Polyps are  tissue growths inside the body. Polyps can grow in many places, including the colon.  Most colon polyps are noncancerous (benign), but some can become cancerous over time.  This condition is diagnosed with a colonoscopy.  Treatment for this condition involves removing any polyps that are found. Most polyps can be removed during a colonoscopy. This information is not intended to replace advice given to you by your health care provider. Make sure you discuss any questions you have with your health care provider. Document Revised: 03/07/2018 Document Reviewed: 03/07/2018 Elsevier Patient Education  [place]. No aspirin or NSAIDs for 24 hours. Resume other medications as before. High-fiber diet No driving for 24 hours. Physician will call with biopsy results.

## 2020-03-01 NOTE — Op Note (Signed)
[facility] Patient Name: Brenda [name] Procedure Date: 03/01/2020 7:10 AM MRN: [id number] Date of Birth: [birth year] Attending MD: Hildred Laser , MD CSN: [id number] Age: 49 Admit Type: Outpatient Procedure:                Colonoscopy Indications:              Screening for colorectal malignant neoplasm Providers:                Hildred Laser, MD, Janeece Riggers, RN, Randa Spike,                            Technician Referring MD:             Grace Bushy. Wolfgang Phoenix, MD Medicines:                Meperidine 50 mg IV, Midazolam 8 mg IV Complications:            No immediate complications. Estimated Blood Loss:     Estimated blood loss was minimal. Procedure:                Pre-Anesthesia Assessment:                           - Prior to the procedure, a History and Physical                            was performed, and patient medications and                            allergies were reviewed. The patient's tolerance of                            previous anesthesia was also reviewed. The risks                            and benefits of the procedure and the sedation                            options and risks were discussed with the patient.                            All questions were answered, and informed consent                            was obtained. Prior Anticoagulants: The patient has                            taken no previous anticoagulant or antiplatelet                            agents except for NSAID medication. ASA Grade                            Assessment: II - A patient with mild systemic  disease. After reviewing the risks and benefits,                            the patient was deemed in satisfactory condition to                            undergo the procedure.                           After obtaining informed consent, the colonoscope                            was passed under direct vision. Throughout the   procedure, the patient's blood pressure, pulse, and                            oxygen saturations were monitored continuously. The                            [id number] was introduced through the                            anus and advanced to the the cecum, identified by                            appendiceal orifice and ileocecal valve. The                            colonoscopy was performed without difficulty. The                            patient tolerated the procedure well. The quality                            of the bowel preparation was excellent. The                            ileocecal valve, appendiceal orifice, and rectum                            were photographed. Scope In: 7:41:26 AM Scope Out: 7:55:01 AM Scope Withdrawal Time: 0 hours 7 minutes 2 seconds  Total Procedure Duration: 0 hours 13 minutes 35 seconds  Findings:      The perianal and digital rectal examinations were normal.      A small polyp was found in the cecum. The polyp was sessile. Biopsies       were taken with a cold forceps for histology.      The exam was otherwise normal throughout the examined colon.      The retroflexed view of the distal rectum and anal verge was normal and       showed no anal or rectal abnormalities. Impression:               - One small polyp in the cecum. Biopsied. Moderate Sedation:      Moderate (conscious) sedation was administered by the  endoscopy nurse       and supervised by the endoscopist. The following parameters were       monitored: oxygen saturation, heart rate, blood pressure, CO2       capnography and response to care. Total physician intraservice time was       20 minutes. Recommendation:           - Patient has a contact number available for                            emergencies. The signs and symptoms of potential                            delayed complications were discussed with the                            patient. Return to normal  activities tomorrow.                            Written discharge instructions were provided to the                            patient.                           - High fiber diet today.                           - Continue present medications.                           - No aspirin, ibuprofen, naproxen, or other                            non-steroidal anti-inflammatory drugs for 1 day.                           - Await pathology results.                           - Repeat colonoscopy is recommended. The                            colonoscopy date will be determined after pathology                            results from today's exam become available for                            review. Procedure Code(s):        --- Professional ---                           [phone], Colonoscopy, flexible; with biopsy, single                            or multiple  [id number], Moderate sedation services provided by the                            same physician or other qualified health care                            professional performing a gastrointestinal                            endoscopic service that sedation supports,                            requiring the presence of an independent trained                            observer to assist in the monitoring of the                            patient's level of consciousness and physiological                            status; initial 15 minutes of intra-service time;                            patient age 40 years or older (additional time may                            be reported with [phone], as appropriate) Diagnosis Code(s):        --- Professional ---                           Z12.11, Encounter for screening for malignant                            neoplasm of colon                           K63.5, Polyp of colon CPT copyright 2019 American Medical Association. All rights reserved. The codes documented in this report are  preliminary and upon coder review may  be revised to meet current compliance requirements. Hildred Laser, MD Hildred Laser, MD 03/01/2020 8:09:51 AM This report has been signed electronically. Number of Addenda: 0

## 2020-03-01 NOTE — H&P (Signed)
Brenda [name] is an 49 y.o. female.   Chief Complaint: Patient is here for colonoscopy. HPI: Patient is 49 year old Caucasian female who is here for screening colonoscopy.  She has chronic constipation.  She is doing better with dietary changes and polyethylene glycol.  She has a bowel movement every other day or every 3 days.  She denies abdominal pain or rectal bleeding.  Family history is negative for CRC.  She says she does not take Aleve often.  Past Medical History:  Diagnosis Date  . Asthma   . GERD (gastroesophageal reflux disease)   . Seasonal allergies     Past Surgical History:  Procedure Laterality Date  . ABDOMINAL HYSTERECTOMY    . BACK SURGERY     3-4 yrs ago.  Marland Kitchen BLADDER SURGERY     incontinence  . BREAST REDUCTION SURGERY Bilateral 08/30/2015   Procedure: BREAST REDUCTION WITH LIPOSUCTION;  Surgeon: [name], MD;  Location: [facility];  Service: Plastics;  Laterality: Bilateral;  . EGD/ED     5 yr ago by Dr. Laural Golden  . ESOPHAGEAL DILATION N/A 09/05/2017   Procedure: ESOPHAGEAL DILATION;  Surgeon: Rogene Houston, MD;  [facility];  Service: Endoscopy;  Laterality: N/A;  . ESOPHAGOGASTRODUODENOSCOPY N/A 09/05/2017   Procedure: ESOPHAGOGASTRODUODENOSCOPY (EGD);  Surgeon: Rogene Houston, MD;  [facility];  Service: Endoscopy;  Laterality: N/A;  1:00  . HIGH RISK BREAST EXCISION Right 2011  . REDUCTION MAMMAPLASTY Bilateral 2016    Family History  Problem Relation Age of Onset  . Cancer Mother 49       endometrial cancer and cervical cancer   . Cancer Maternal Grandmother        prostate cancer   Social History:  reports that she has never smoked. She has never used smokeless tobacco. She reports that she does not drink alcohol or use drugs.  Allergies:  Allergies  Allergen Reactions  . Other Other (See Comments)    Grass ,  Bed mites,   Dogs  And  Cats.   . Adhesive [Tape] Rash  . Latex Rash    Medications  Prior to Admission  Medication Sig Dispense Refill  . acetaminophen (TYLENOL) 650 MG CR tablet Take 1,950 mg by mouth every 8 (eight) hours as needed for pain.    Marland Kitchen albuterol (PROAIR HFA) 108 (90 Base) MCG/ACT inhaler Inhale 2 puffs into the lungs every 4 (four) hours as needed for wheezing or shortness of breath. 8.5 g 5  . albuterol (PROVENTIL) (2.5 MG/3ML) 0.083% nebulizer solution Use q 4 hrs via nebulizer prn wheezing 75 mL 0  . APPLE CIDER VINEGAR PO Take 2 tablets by mouth in the morning and at bedtime. Goli Apple Cider Gummy    . B Complex-C (B-COMPLEX WITH VITAMIN C) tablet Take 1 tablet by mouth daily.    . Black Cohosh 540 MG CAPS Take 540 mg by mouth daily.    . Cholecalciferol (VITAMIN D3) 50 MCG (2000 UT) TABS Take 2,000 Units by mouth daily.    . Echinacea 450 MG CAPS Take 450 mg by mouth daily.    . fluticasone (FLOVENT HFA) 44 MCG/ACT inhaler Inhale 2 puffs into the lungs 2 (two) times daily. (Patient taking differently: Inhale 2 puffs into the lungs 2 (two) times daily as needed (respiratory issues.). ) 1 Inhaler 5  . loratadine-pseudoephedrine (CLARITIN-D 12-HOUR) 5-120 MG tablet Take 1 tablet by mouth daily.    . naproxen sodium (ALEVE) 220 MG  tablet Take 660 mg by mouth 2 (two) times daily as needed (pain).    . pantoprazole (PROTONIX) 40 MG tablet Take 1 tablet (40 mg total) by mouth daily. Take 30 min before breakfast 90 tablet 3  . polyethylene glycol (MIRALAX / GLYCOLAX) 17 g packet Take 17 g by mouth daily.    Marland Kitchen triamcinolone cream (KENALOG) 0.1 % Apply to affected area BID (Patient taking differently: Apply 1 application topically 2 (two) times daily as needed (skin irritation/rash). ) 30 g 2  . vitamin C (ASCORBIC ACID) 250 MG tablet Take 500 mg by mouth daily.    . Zinc 50 MG TABS Take 50 mg by mouth daily.      No results found for this or any previous visit (from the past 48 hour(s)). No results found.  Review of Systems  Blood pressure 124/70, pulse 70,  temperature 97.9 F (36.6 C), temperature source Oral, resp. rate 19, height 5\' 7"  (1.702 m), last menstrual period 07/06/2004, SpO2 98 %. Physical Exam  Constitutional: She appears well-developed and well-nourished.  HENT:  Mouth/Throat: Oropharynx is clear and moist.  Eyes: Conjunctivae are normal. No scleral icterus.  Neck: No thyromegaly present.  Cardiovascular: Normal rate, regular rhythm and normal heart sounds.  No murmur heard. Respiratory: Effort normal and breath sounds normal.  GI:  Abdomen is symmetrical.  Pfannenstiel scar noted.  On palpation abdomen is soft and nontender with organomegaly or masses.  Musculoskeletal:        General: No edema.  Lymphadenopathy:    She has no cervical adenopathy.  Neurological: She is alert.  Skin: Skin is warm and dry.     Assessment/Plan Average risk screening colonoscopy.  Hildred Laser, MD 03/01/2020, 7:31 AM

## 2020-03-02 LAB — SURGICAL PATHOLOGY

## 2020-03-03 ENCOUNTER — Other Ambulatory Visit: Payer: Self-pay | Admitting: Family Medicine

## 2020-03-03 DIAGNOSIS — Z1231 Encounter for screening mammogram for malignant neoplasm of breast: Secondary | ICD-10-CM

## 2020-03-08 ENCOUNTER — Other Ambulatory Visit (INDEPENDENT_AMBULATORY_CARE_PROVIDER_SITE_OTHER): Payer: Self-pay | Admitting: Internal Medicine

## 2020-03-17 ENCOUNTER — Ambulatory Visit: Payer: 59

## 2020-03-22 ENCOUNTER — Ambulatory Visit
Admission: RE | Admit: 2020-03-22 | Discharge: 2020-03-22 | Disposition: A | Discharge status: Home / Self Care | Payer: 59 | Source: Ambulatory Visit | Attending: Family Medicine | Admitting: Family Medicine

## 2020-03-22 ENCOUNTER — Other Ambulatory Visit: Payer: Self-pay

## 2020-03-22 DIAGNOSIS — Z1231 Encounter for screening mammogram for malignant neoplasm of breast: Secondary | ICD-10-CM

## 2020-04-06 ENCOUNTER — Other Ambulatory Visit: Payer: Self-pay | Admitting: *Deleted

## 2020-04-06 MED ORDER — FLOVENT HFA 44 MCG/ACT IN AERO: 2.0000 | INHALATION_SPRAY | Freq: Two times a day (BID) | RESPIRATORY_TRACT | 5 refills | Status: DC | PRN

## 2020-04-13 ENCOUNTER — Telehealth: Payer: Self-pay | Admitting: Family Medicine

## 2020-04-13 NOTE — Telephone Encounter (Signed)
Pt seeing GI would recommend pt call them for this medication.  Not usually written for out-pt setting.  But otherwise she can use tums or mylanta otc for additional reflux symptoms.  Thx,   Dr. Lovena Le

## 2020-04-13 NOTE — Telephone Encounter (Signed)
Pt states that she has been on a diet and had a cheat day and is not sure if that had "threw her for a loop". Pt is on acid reflux med that she takes on a daily basis. Pt states she has had the cocktail before and it really helps her. Please advise. Thank you     [facility] [address]

## 2020-04-13 NOTE — Telephone Encounter (Signed)
Pls gather more information.  What is she using it for? Normally not an out patient medication.   Thx,   Dr. Lovena Le

## 2020-04-13 NOTE — Telephone Encounter (Signed)
Patient is requesting  a liquid cocktail for reflux called into [facility].

## 2020-04-13 NOTE — Telephone Encounter (Signed)
Please advise. Thank you

## 2020-04-13 NOTE — Telephone Encounter (Signed)
Pt contacted and verbalized understanding.  

## 2020-09-13 ENCOUNTER — Telehealth: Payer: Self-pay

## 2020-09-13 DIAGNOSIS — Z79899 Other long term (current) drug therapy: Secondary | ICD-10-CM

## 2020-09-13 DIAGNOSIS — R5383 Other fatigue: Secondary | ICD-10-CM

## 2020-09-13 DIAGNOSIS — Z13220 Encounter for screening for lipoid disorders: Secondary | ICD-10-CM

## 2020-09-13 NOTE — Telephone Encounter (Signed)
Patient has physical on 10/15 and needing labs done

## 2020-09-13 NOTE — Telephone Encounter (Signed)
Last labs 06/2019: Lipid, Liver, Met 7, Vit D and TSH

## 2020-09-14 NOTE — Telephone Encounter (Signed)
Please order same labs and add CBC with diff. Thanks

## 2020-09-14 NOTE — Telephone Encounter (Signed)
Blood work ordered in [facility]. Patient notified and verbalized understanding.

## 2020-09-14 NOTE — Addendum Note (Signed)
Addended by: Dairl Ponder on: 09/14/2020 03:32 PM   Modules accepted: Orders

## 2020-09-16 LAB — BASIC METABOLIC PANEL
BUN/Creatinine Ratio: 11 (ref 9–23)
BUN: 10 mg/dL (ref 6–24)
CO2: 26 mmol/L (ref 20–29)
Calcium: 10.1 mg/dL (ref 8.7–10.2)
Chloride: 102 mmol/L (ref 96–106)
Creatinine, Ser: 0.95 mg/dL (ref 0.57–1.00)
GFR calc Af Amer: 81 mL/min/{1.73_m2} (ref >59)
GFR calc non Af Amer: 70 mL/min/{1.73_m2} (ref >59)
Glucose: 89 mg/dL (ref 65–99)
Potassium: 4.5 mmol/L (ref 3.5–5.2)
Sodium: 141 mmol/L (ref 134–144)

## 2020-09-16 LAB — CBC WITH DIFFERENTIAL/PLATELET
Basophils Absolute: 0.1 10*3/uL (ref 0.0–0.2)
Basos: 1 %
EOS (ABSOLUTE): 0.2 10*3/uL (ref 0.0–0.4)
Eos: 3 %
Hematocrit: 43.3 % (ref 34.0–46.6)
Hemoglobin: 14.2 g/dL (ref 11.1–15.9)
Immature Grans (Abs): 0 10*3/uL (ref 0.0–0.1)
Immature Granulocytes: 0 %
Lymphocytes Absolute: 2 10*3/uL (ref 0.7–3.1)
Lymphs: 29 %
MCH: 26.8 pg (ref 26.6–33.0)
MCHC: 32.8 g/dL (ref 31.5–35.7)
MCV: 82 fL (ref 79–97)
Monocytes Absolute: 0.5 10*3/uL (ref 0.1–0.9)
Monocytes: 7 %
Neutrophils Absolute: 4 10*3/uL (ref 1.4–7.0)
Neutrophils: 60 %
Platelets: 377 10*3/uL (ref 150–450)
RBC: 5.3 x10E6/uL — ABNORMAL HIGH (ref 3.77–5.28)
RDW: 13.8 % (ref 11.7–15.4)
WBC: 6.8 10*3/uL (ref 3.4–10.8)

## 2020-09-16 LAB — LIPID PANEL
Chol/HDL Ratio: 2.8 ratio (ref 0.0–4.4)
Cholesterol, Total: 194 mg/dL (ref 100–199)
HDL: 69 mg/dL (ref >39)
LDL Chol Calc (NIH): 107 mg/dL — ABNORMAL HIGH (ref 0–99)
Triglycerides: 102 mg/dL (ref 0–149)
VLDL Cholesterol Cal: 18 mg/dL (ref 5–40)

## 2020-09-16 LAB — HEPATIC FUNCTION PANEL
ALT: 18 IU/L (ref 0–32)
AST: 22 IU/L (ref 0–40)
Albumin: 4.3 g/dL (ref 3.8–4.8)
Alkaline Phosphatase: 108 IU/L (ref 44–121)
Bilirubin Total: 0.7 mg/dL (ref 0.0–1.2)
Bilirubin, Direct: 0.14 mg/dL (ref 0.00–0.40)
Total Protein: 7.2 g/dL (ref 6.0–8.5)

## 2020-09-16 LAB — TSH: TSH: 2.82 u[IU]/mL (ref 0.450–4.500)

## 2020-09-16 LAB — VITAMIN D 25 HYDROXY (VIT D DEFICIENCY, FRACTURES): Vit D, 25-Hydroxy: 52.4 ng/mL (ref 30.0–100.0)

## 2020-09-17 ENCOUNTER — Encounter: Payer: Self-pay | Admitting: Nurse Practitioner

## 2020-09-17 ENCOUNTER — Ambulatory Visit (INDEPENDENT_AMBULATORY_CARE_PROVIDER_SITE_OTHER): Payer: 59 | Admitting: Nurse Practitioner

## 2020-09-17 VITALS — BP 122/78 | HR 115 | Temp 97.2°F | Ht 65.5 in | Wt 204.0 lb

## 2020-09-17 DIAGNOSIS — Z0000 Encounter for general adult medical examination without abnormal findings: Secondary | ICD-10-CM

## 2020-09-17 DIAGNOSIS — N951 Menopausal and female climacteric states: Secondary | ICD-10-CM

## 2020-09-17 DIAGNOSIS — F32A Depression, unspecified: Secondary | ICD-10-CM

## 2020-09-17 DIAGNOSIS — F419 Anxiety disorder, unspecified: Secondary | ICD-10-CM | POA: Diagnosis not present

## 2020-09-17 DIAGNOSIS — Z01419 Encounter for gynecological examination (general) (routine) without abnormal findings: Secondary | ICD-10-CM

## 2020-09-17 NOTE — Progress Notes (Signed)
Subjective:    Subjective   Patient ID: Brenda [name], female    DOB: 24-Apr-1971, 49 y.o.   MRN: [id number]  HPI The patient comes in today for a wellness visit. She reports intentional weight loss of 36lbs since January. She states she has improved her dietary choices and exercises at least 3x per week. She is up to date on both mammogram and colonoscopy. 03/23/20 mammogram results discused this visit. COVID and influenza vaccines up to date and recorded in chart. Her last routine eye exam was 01/2020 and she plans to schedule her dental exam next week. She is s/p hysterectomy with remaining ovaries intact. Family history significant for ovarian cancer. She reports dyspareunia, night sweats, hot flashes, and vaginal burning and dryness. [facility] [facility] drawn by outside facility. Patient is married with no recent change in sexual partner. She has began black cohosh to assist with menopausal symptoms. She notes minimal use of resuce inhaler and does not smoke. She drinks occasionally ETOH socially and denies illicit drug use. She is employed as a Clinical research associate at [employer].   A review of their health history was completed.  A review of medications was also completed.  Any needed refills: Will have pharmacy request Eating habits: eats healthy majority of times  Falls/  MVA accidents in past few months: none  Regular exercise: tries to walk 3x per week  Specialist pt sees on regular basis: GI Preventative health issues were discussed.   Additional concerns: pt had labs drawn to look at hormones and would like to talk to provider about them   Review of Systems  Constitutional: Positive for weight loss (expected). Negative for chills and fever.  HENT: Negative for congestion, hearing loss, sore throat and tinnitus.   Respiratory: Negative for cough, shortness of breath and wheezing.   Cardiovascular: Negative for chest pain, palpitations and leg swelling.  Gastrointestinal: Positive for  heartburn. Negative for abdominal pain, blood in stool, constipation, diarrhea, nausea and vomiting.  Genitourinary: Negative for dysuria, frequency and urgency.  Musculoskeletal: Positive for back pain. Negative for falls.  Skin: Negative for itching and rash.  Psychiatric/Behavioral: Negative for suicidal ideas.   GAD 7 : Generalized Anxiety Score 09/17/2020  Nervous, Anxious, on Edge 1  Control/stop worrying 1  Worry too much - different things 1  Trouble relaxing 3  Restless 3  Easily annoyed or irritable 1  Afraid - awful might happen 0  Total GAD 7 Score 10  Anxiety Difficulty Somewhat difficult   Depression screen [facility] 2/9 09/17/2020 09/24/2017 09/24/2017  Decreased Interest 0 2 1  Down, Depressed, Hopeless 0 2 1  PHQ - 2 Score 0 4 2  Altered sleeping 3 3 -  Tired, decreased energy 1 2 -  Change in appetite 1 3 -  Feeling bad or failure about yourself  0 3 -  Trouble concentrating 0 2 -  Moving slowly or fidgety/restless 0 2 -  Suicidal thoughts 0 2 -  PHQ-9 Score 5 21 -  Difficult doing work/chores Somewhat difficult Somewhat difficult -        Objective:   Objective  Vitals:   09/17/20 1400  BP: 122/78  Pulse: (!) 115  Temp: (!) 97.2 F (36.2 C)  SpO2: 98%     Physical Exam Constitutional:      General: She is not in acute distress.    Appearance: Normal appearance.  Neck:     Thyroid: No thyroid mass, thyromegaly or thyroid tenderness.  Cardiovascular:  Rate and Rhythm: Normal rate and regular rhythm.     Pulses: Normal pulses.     Heart sounds: No murmur heard.  No gallop.   Pulmonary:     Effort: Pulmonary effort is normal. No respiratory distress.     Breath sounds: Normal breath sounds. No wheezing or rhonchi.  Chest:     Breasts:        Right: Normal. No swelling, inverted nipple, mass, skin change or tenderness.        Left: Normal. No swelling, inverted nipple, mass, skin change or tenderness.     Comments: Old scarring on both  breasts from breast reduction surgery. Tissue very dense on exam.  Abdominal:     General: Bowel sounds are normal. There is no distension.     Palpations: Abdomen is soft. There is no mass.     Tenderness: There is no abdominal tenderness. There is no guarding.     Hernia: No hernia is present.  Genitourinary:    General: Normal vulva.     Labia:        Right: No rash or lesion.        Left: No rash or lesion.      Vagina: No vaginal discharge, erythema, tenderness or lesions.     Comments: External GU: no rashes or lesions. Vagina: pink, no discharge or lesions.  Bimanual exam. : No tenderness or obvious masses.  Musculoskeletal:        General: Normal range of motion.     Cervical back: Normal range of motion. No tenderness. Normal range of motion.     Right lower leg: No edema.     Left lower leg: No edema.  Lymphadenopathy:     Cervical:     Right cervical: No superficial, deep or posterior cervical adenopathy.    Left cervical: No superficial, deep or posterior cervical adenopathy.  Skin:    General: Skin is warm and dry.     Capillary Refill: Capillary refill takes less than 2 seconds.  Neurological:     Mental Status: She is alert and oriented to person, place, and time.  Psychiatric:        Mood and Affect: Mood normal.        Behavior: Behavior normal.        Thought Content: Thought content normal.        Judgment: Judgment normal.    Recent Results (from the past 2160 hour(s))  Lipid panel     Status: Abnormal   Collection Time: 09/15/20  8:12 AM  Result Value Ref Range   Cholesterol, Total 194 100 - 199 mg/dL   Triglycerides 102 0 - 149 mg/dL   HDL 69 >39 mg/dL   VLDL Cholesterol Cal 18 5 - 40 mg/dL   LDL Chol Calc (NIH) 107 (H) 0 - 99 mg/dL   Chol/HDL Ratio 2.8 0.0 - 4.4 ratio    Comment:                                   T. Chol/HDL Ratio                                             Men  Women  1/2 Avg.Risk  3.4    3.3                                    Avg.Risk  5.0    4.4                                2X Avg.Risk  9.6    7.1                                3X Avg.Risk 23.4   11.0   Hepatic function panel     Status: None   Collection Time: 09/15/20  8:12 AM  Result Value Ref Range   Total Protein 7.2 6.0 - 8.5 g/dL   Albumin 4.3 3.8 - 4.8 g/dL   Bilirubin Total 0.7 0.0 - 1.2 mg/dL   Bilirubin, Direct 0.14 0.00 - 0.40 mg/dL   Alkaline Phosphatase 108 44 - 121 IU/L    Comment:               **Please note reference interval change**   AST 22 0 - 40 IU/L   ALT 18 0 - 32 IU/L  Basic metabolic panel     Status: None   Collection Time: 09/15/20  8:12 AM  Result Value Ref Range   Glucose 89 65 - 99 mg/dL   BUN 10 6 - 24 mg/dL   Creatinine, Ser 0.95 0.57 - 1.00 mg/dL   GFR calc non Af Amer 70 >59 mL/min/1.73   GFR calc Af Amer 81 >59 mL/min/1.73    Comment: **In accordance with recommendations from the NKF-ASN Task force,**   [facility] is in the process of updating its eGFR calculation to the   2021 CKD-EPI creatinine equation that estimates kidney function   without a race variable.    BUN/Creatinine Ratio 11 9 - 23   Sodium 141 134 - 144 mmol/L   Potassium 4.5 3.5 - 5.2 mmol/L   Chloride 102 96 - 106 mmol/L   CO2 26 20 - 29 mmol/L   Calcium 10.1 8.7 - 10.2 mg/dL  VITAMIN D 25 Hydroxy (Vit-D Deficiency, Fractures)     Status: None   Collection Time: 09/15/20  8:12 AM  Result Value Ref Range   Vit D, 25-Hydroxy 52.4 30.0 - 100.0 ng/mL    Comment: Vitamin D deficiency has been defined by the [place] and an Endocrine Society practice guideline as a level of serum 25-OH vitamin D less than 20 ng/mL (1,2). The Endocrine Society went on to further define vitamin D insufficiency as a level between 21 and 29 ng/mL (2). 1. IOM (Institute of Medicine). 2010. Dietary reference    intakes for calcium and D. [place]: The    [employer]. 2. Holick MF, Binkley [place], Bischoff-Ferrari HA,  et al.    Evaluation, treatment, and prevention of vitamin D    deficiency: an Endocrine Society clinical practice    guideline. JCEM. 2011 Jul; 96(7):1911-30.   TSH     Status: None   Collection Time: 09/15/20  8:12 AM  Result Value Ref Range   TSH 2.820 0.450 - 4.500 uIU/mL  CBC with Differential/Platelet     Status: Abnormal   Collection Time: 09/15/20  8:12 AM  Result Value Ref Range  WBC 6.8 3.4 - 10.8 x10E3/uL   RBC 5.30 (H) 3.77 - 5.28 x10E6/uL   Hemoglobin 14.2 11.1 - 15.9 g/dL   Hematocrit 43.3 34.0 - 46.6 %   MCV 82 79 - 97 fL   MCH 26.8 26.6 - 33.0 pg   MCHC 32.8 31 - 35 g/dL   RDW 13.8 11.7 - 15.4 %   Platelets 377 150 - 450 x10E3/uL   Neutrophils 60 Not Estab. %   Lymphs 29 Not Estab. %   Monocytes 7 Not Estab. %   Eos 3 Not Estab. %   Basos 1 Not Estab. %   Neutrophils Absolute 4.0 1 - 7 x10E3/uL   Lymphocytes Absolute 2.0 0 - 3 x10E3/uL   Monocytes Absolute 0.5 0 - 0 x10E3/uL   EOS (ABSOLUTE) 0.2 0.0 - 0.4 x10E3/uL   Basophils Absolute 0.1 0 - 0 x10E3/uL   Immature Granulocytes 0 Not Estab. %   Immature Grans (Abs) 0.0 0.0 - 0.1 x10E3/uL   Reviewed labs with patient.    Assessment & Plan:   Problem List Items Addressed This Visit      Cardiovascular and Mediastinum   Hot flushes, perimenopausal     Other   Anxiety and depression    Other Visit Diagnoses    Well woman exam    -  Primary     Discussed symptom management related to menopause.  [place] Jelly for lubrication with intercourse Replens or Luvena OTC for vaginal dryness and burning Educated patient on the risks/benefits of herbal medications May use Soy, Flaxseed, and/or Estroven to assist with mood stability and estrogen levels. Call back if no improvement.  Continue healthy lifestyle changes.  Denies family history of breast cancer. Had a breast biopsy in 2011. Mammogram 03/22/20 shows dense tissue class C. Patient understands this may obscure small masses.  Return in about 1 year  (around 09/17/2021) for physical.

## 2020-09-17 NOTE — Progress Notes (Signed)
   Subjective:    Patient ID: Brenda [name], female    DOB: 1971-09-18, 49 y.o.   MRN: [id number]  HPI The patient comes in today for a wellness visit.    A review of their health history was completed.  A review of medications was also completed.  Any needed refills; all prescribed meds  Eating habits: eats healthy at times  Falls/  MVA accidents in past few months: none  Regular exercise: tries to   Specialist pt sees on regular basis: stomach specialist   Preventative health issues were discussed.   Additional concerns: pt had labs drawn to look at hormones and would like to talk to provider about them   Review of Systems     Objective:   Physical Exam        Assessment & Plan:

## 2020-09-17 NOTE — Patient Instructions (Signed)
KY Jelly  Replens or Luvena gel to help with vaginal dryness Soy/Flaxseed/ Estroven for Estrogen    Seasonal Affective Disorder Seasonal affective disorder (SAD) is a type of depression. It is when you feel depressed at specific times of the year. SAD is most common during late fall and winter when the days are shorter and most people spend less time outdoors. This is why SAD is also known as the "winter blues." SAD occurs less commonly in the spring or summer. SAD can be mild to severe, and it can interfere with work, school, relationships, and normal daily activities. What are the causes? The cause of this condition is not known. It may be related to changes in brain chemistry that are caused by having less exposure to daylight. What increases the risk? You are more likely to develop this condition if:  You are female.  You live far [place] or far [place] of the equator. These areas get less sunlight and have longer winter seasons.  You have a personal history of depression or bipolar disorder.  You have a family history of mental health conditions. What are the signs or symptoms? Symptoms of this condition include:  Depressed mood, which may involve: ? Feeling sad or teary. ? Having crying spells.  Irritability.  Trouble sleeping, or sleeping more than usual.  Loss of interest in activities that you usually enjoy.  Feelings of guilt or worthlessness.  Restlessness or loss of energy.  Difficulty concentrating, remembering, or making decisions.  Significant change in appetite or weight.  Thinking about self-harm or attempting suicide. Symptoms associated with the winter pattern of SAD include:  Overeating or craving sweet foods.  Weight gain.  Avoiding social situations (social withdrawal), or feeling like "hibernating."  Sleeping more than usual. Symptoms associated with the less common summer pattern of SAD include:  Loss of appetite.  Weight loss.  Trouble  sleeping.  Episodes of violent behavior (in severe cases). How is this diagnosed? This condition is usually diagnosed through an assessment with your health care provider. You will be asked about your moods, thoughts, and behaviors. You will also be asked about your medical history, any major life changes, and any medicines and substances that you use. You may have a physical exam and blood tests to rule out other possible causes of your symptoms. You may be referred to a mental health specialist for more evaluation. How is this treated? Treatment for this condition may include:  Light therapy. This therapy involves sitting in front of a light source for 15-30 minutes every day. The light source may be: ? A light box. ? A dawn simulator or sunrise clock. This is a timer-activated light source that copies the sunrise by slowly becoming brighter. This can help to activate your body's internal clock.  Antidepressant medicine.  Cognitive behavioral therapy (CBT). CBT is a form of talk therapy that helps to identify and change negative thoughts that are associated with SAD.  Changes to your dietary, exercise, or sleeping habits. A healthy lifestyle may help to prevent or relieve symptoms. Follow these instructions at home: Medicines  Take over-the-counter and prescription medicines only as told by your healthcare provider.  If you are taking antidepressant medicines, ask your health care provider what side effects you should be aware of.  Talk with your health care provider before you start taking any new prescription or over-the-counter medicines, herbs, or supplements. Lifestyle  Eat a healthy diet that includes fruits and vegetables, whole grains, and lean proteins.  Get plenty of sleep. To improve your sleep, make sure you: ? Keep your bedroom dark and cool. ? Go to sleep and wake up at about the same time every day. ? Do not keep screens (such as a TV or smartphone) in your bedroom.  Limit your screen time starting a few hours before bedtime.  Exercise regularly.  Limit alcohol and caffeine as told by your health care provider. General instructions  Make your home and work environment as sunny or bright as possible. Open window blinds and move furniture closer to windows.  Spend as much time outside as possible.  Use light therapy for 15-30 minutes every day, or as often as directed.  Attend CBT therapy sessions as directed.  Keep all follow-up visits as told by your health care provider and therapist. This is important. Contact a health care provider if:  Your symptoms do not get better or they get worse.  You have trouble taking care of yourself.  You are using drugs or alcohol to cope with your symptoms.  You have side effects from medicines. Get help right away if:  You have thoughts about hurting yourself or others. If you ever feel like you may hurt yourself or others, or have thoughts about taking your own life, get help right away. You can go to your nearest emergency department or call:  Your local emergency services (911 in the [place]  A suicide crisis helpline, such as the [place] at 680 600 1790. This is open 24 hours a day. Summary  Seasonal affective disorder (SAD) is a type of depression that is associated with specific times of the year (usually fall and winter).  This condition may be treated with light therapy, talk therapy, and antidepressant medicines.  To help treat your condition, take good care of yourself and make home and work as sunny and bright as possible.  Seek help right away if you have thoughts about hurting yourself or others. This information is not intended to replace advice given to you by your health care provider. Make sure you discuss any questions you have with your health care provider. Document Revised: 11/02/2017 Document Reviewed: 08/02/2017 Elsevier Patient Education  2020  [employer].

## 2020-09-19 ENCOUNTER — Encounter: Payer: Self-pay | Admitting: Nurse Practitioner

## 2020-11-22 ENCOUNTER — Other Ambulatory Visit: Payer: Self-pay

## 2020-11-22 ENCOUNTER — Ambulatory Visit
Admission: EM | Admit: 2020-11-22 | Discharge: 2020-11-22 | Disposition: A | Discharge status: Home / Self Care | Payer: 59 | Attending: Urgent Care | Admitting: Urgent Care

## 2020-11-22 DIAGNOSIS — B349 Viral infection, unspecified: Secondary | ICD-10-CM | POA: Diagnosis not present

## 2020-11-22 DIAGNOSIS — R509 Fever, unspecified: Secondary | ICD-10-CM | POA: Diagnosis not present

## 2020-11-22 DIAGNOSIS — J45909 Unspecified asthma, uncomplicated: Secondary | ICD-10-CM

## 2020-11-22 DIAGNOSIS — Z1152 Encounter for screening for COVID-19: Secondary | ICD-10-CM

## 2020-11-22 DIAGNOSIS — R52 Pain, unspecified: Secondary | ICD-10-CM

## 2020-11-22 DIAGNOSIS — Z8709 Personal history of other diseases of the respiratory system: Secondary | ICD-10-CM

## 2020-11-22 LAB — POCT INFLUENZA A/B
Influenza A, POC: NEGATIVE
Influenza B, POC: NEGATIVE

## 2020-11-22 MED ORDER — BENZONATATE 100 MG PO CAPS: 100.0000 mg | ORAL_CAPSULE | Freq: Three times a day (TID) | ORAL | 0 refills | Status: DC | PRN

## 2020-11-22 MED ORDER — PROMETHAZINE-DM 6.25-15 MG/5ML PO SYRP: 5.0000 mL | ORAL_SOLUTION | Freq: Every evening | ORAL | 0 refills | Status: DC | PRN

## 2020-11-22 MED ORDER — OSELTAMIVIR PHOSPHATE 75 MG PO CAPS: 75.0000 mg | ORAL_CAPSULE | Freq: Two times a day (BID) | ORAL | 0 refills | Status: DC

## 2020-11-22 MED ORDER — ALBUTEROL SULFATE (2.5 MG/3ML) 0.083% IN NEBU: INHALATION_SOLUTION | RESPIRATORY_TRACT | 0 refills | Status: DC

## 2020-11-22 MED ORDER — ALBUTEROL SULFATE HFA 108 (90 BASE) MCG/ACT IN AERS: 2.0000 | INHALATION_SPRAY | RESPIRATORY_TRACT | 0 refills | Status: DC | PRN

## 2020-11-22 NOTE — ED Triage Notes (Signed)
Pt presents with c/o body aches and fever that began this morning. Reports fever of 101 this morning

## 2020-11-22 NOTE — ED Provider Notes (Signed)
[place]   MRN: [id number] DOB: [birth year]  Subjective:   Brenda [name] is a 49 y.o. female presenting for acute onset this morning fever and body aches.  Has also had slight runny nose.  Has been Covid and flu vaccinated.  Patient does have a history of reactive airway dysfunction syndrome, asthma.  She has an albuterol inhaler on hand.  Denies chest pain, shortness of breath.  No current facility-administered medications for this encounter.  Current Outpatient Medications:  .  albuterol (PROAIR HFA) 108 (90 Base) MCG/ACT inhaler, Inhale 2 puffs into the lungs every 4 (four) hours as needed for wheezing or shortness of breath., Disp: 8.5 g, Rfl: 5 .  albuterol (PROVENTIL) (2.5 MG/3ML) 0.083% nebulizer solution, Use q 4 hrs via nebulizer prn wheezing, Disp: 75 mL, Rfl: 0 .  APPLE CIDER VINEGAR PO, Take 2 tablets by mouth in the morning and at bedtime. Goli Apple Cider Gummy, Disp: , Rfl:  .  B Complex-C (B-COMPLEX WITH VITAMIN C) tablet, Take 1 tablet by mouth daily., Disp: , Rfl:  .  Black Cohosh 540 MG CAPS, Take 540 mg by mouth daily., Disp: , Rfl:  .  Cholecalciferol (VITAMIN D3) 50 MCG (2000 UT) TABS, Take 2,000 Units by mouth daily., Disp: , Rfl:  .  fluticasone (FLOVENT HFA) 44 MCG/ACT inhaler, Inhale 2 puffs into the lungs 2 (two) times daily as needed (respiratory issues.)., Disp: 1 Inhaler, Rfl: 5 .  loratadine-pseudoephedrine (CLARITIN-D 12-HOUR) 5-120 MG tablet, Take 1 tablet by mouth daily., Disp: , Rfl:  .  naproxen sodium (ALEVE) 220 MG tablet, Take 2 tablets (440 mg total) by mouth 2 (two) times daily as needed (pain)., Disp: , Rfl:  .  pantoprazole (PROTONIX) 40 MG tablet, TAKE 1 TABLET BY MOUTH EVERY DAY BEFORE BREAKFAST, Disp: 90 tablet, Rfl: 3 .  polyethylene glycol (MIRALAX / GLYCOLAX) 17 g packet, Take 17 g by mouth daily., Disp: , Rfl:  .  triamcinolone cream (KENALOG) 0.1 %, Apply to affected area BID (Patient taking differently: Apply 1  application topically 2 (two) times daily as needed (skin irritation/rash). ), Disp: 30 g, Rfl: 2 .  vitamin C (ASCORBIC ACID) 250 MG tablet, Take 500 mg by mouth daily. (Patient not taking: Reported on 09/17/2020), Disp: , Rfl:    Allergies  Allergen Reactions  . Other Other (See Comments)    Grass ,  Bed mites,   Dogs  And  Cats.   . Adhesive [Tape] Rash  . Latex Rash    Past Medical History:  Diagnosis Date  . Asthma   . GERD (gastroesophageal reflux disease)   . Seasonal allergies      Past Surgical History:  Procedure Laterality Date  . ABDOMINAL HYSTERECTOMY    . BACK SURGERY     3-4 yrs ago.  Marland Kitchen BIOPSY  03/01/2020   Procedure: BIOPSY;  Surgeon: Rogene Houston, MD;  [facility];  Service: Endoscopy;;  . BLADDER SURGERY     incontinence  . BREAST REDUCTION SURGERY Bilateral 08/30/2015   Procedure: BREAST REDUCTION WITH LIPOSUCTION;  Surgeon: [name], MD;  Location: [facility];  Service: Plastics;  Laterality: Bilateral;  . COLONOSCOPY N/A 03/01/2020   Procedure: COLONOSCOPY;  Surgeon: Rogene Houston, MD;  [facility]: [facility];  Service: Endoscopy;  Laterality: N/A;  730  . EGD/ED     5 yr ago by Dr. Laural Golden  . ESOPHAGEAL DILATION N/A 09/05/2017   Procedure: ESOPHAGEAL DILATION;  Surgeon:  Rogene Houston, MD;  [facility];  Service: Endoscopy;  Laterality: N/A;  . ESOPHAGOGASTRODUODENOSCOPY N/A 09/05/2017   Procedure: ESOPHAGOGASTRODUODENOSCOPY (EGD);  Surgeon: Rogene Houston, MD;  [facility];  Service: Endoscopy;  Laterality: N/A;  1:00  . HIGH RISK BREAST EXCISION Right 2011  . REDUCTION MAMMAPLASTY Bilateral 2016    Family History  Problem Relation Age of Onset  . Cancer Mother 67       endometrial cancer and cervical cancer   . Cancer Maternal Grandmother        prostate cancer    Social History   Tobacco Use  . Smoking status: Never Smoker  . Smokeless tobacco: Never Used  Vaping Use  .  Vaping Use: Never used  Substance Use Topics  . Alcohol use: No    Comment: rarely   . Drug use: No    ROS   Objective:   Vitals: BP (!) 144/80   Pulse (!) 122   Temp 98.5 F (36.9 C)   Resp 18   LMP 07/06/2004   SpO2 95%   Physical Exam Constitutional:      General: She is not in acute distress.    Appearance: Normal appearance. She is well-developed. She is not ill-appearing, toxic-appearing or diaphoretic.  HENT:     Head: Normocephalic and atraumatic.     Nose: Nose normal.     Mouth/Throat:     Mouth: Mucous membranes are moist.  Eyes:     Extraocular Movements: Extraocular movements intact.     Pupils: Pupils are equal, round, and reactive to light.  Cardiovascular:     Rate and Rhythm: Normal rate and regular rhythm.     Pulses: Normal pulses.     Heart sounds: Normal heart sounds. No murmur heard. No friction rub. No gallop.   Pulmonary:     Effort: Pulmonary effort is normal. No respiratory distress.     Breath sounds: Normal breath sounds. No stridor. No wheezing, rhonchi or rales.  Skin:    General: Skin is warm and dry.     Findings: No rash.  Neurological:     Mental Status: She is alert and oriented to person, place, and time.  Psychiatric:        Mood and Affect: Mood normal.        Behavior: Behavior normal.        Thought Content: Thought content normal.        Judgment: Judgment normal.    Results for orders placed or performed during the hospital encounter of 11/22/20 (from the past 24 hour(s))  POCT Influenza A/B     Status: None   Collection Time: 11/22/20 10:04 AM  Result Value Ref Range   Influenza A, POC Negative Negative   Influenza B, POC Negative Negative    Assessment and Plan :   PDMP not reviewed this encounter.  1. Viral syndrome   2. Fever, unspecified   3. Body aches   4. Encounter for screening for COVID-19   5. History of asthma   6. Reactive airway disease without complication, unspecified asthma severity,  unspecified whether persistent     Will for influenza given symptoms had, timeframe.  Start Tamiflu.  Counseled patient on nature of influenza, COVID-19 including modes of transmission, diagnostic testing, management and supportive care.  Offered scripts for symptomatic relief. COVID 19 testing is pending. Counseled patient on potential for adverse effects with medications prescribed/recommended today, ER and return-to-clinic precautions discussed, patient  verbalized understanding.     Jaynee Eagles, PA-C 11/22/20 1014

## 2020-11-22 NOTE — Discharge Instructions (Addendum)
We will notify you of your COVID-19 test results as they arrive and may take between 24 to 48 hours.  I encourage you to sign up for MyChart if you have not already done so as this can be the easiest way for [place] to communicate results to you online or through a phone app.  In the meantime, if you develop worsening symptoms including fever, chest pain, shortness of breath despite our current treatment plan then please report to the emergency room as this may be a sign of worsening status from possible COVID-19 infection.  Otherwise, we will manage this as a viral syndrome like influenza.  As discussed blood start Tamiflu since you are within the timeframe that it can be very helpful.  For sore throat or cough try using a honey-based tea. Use 3 teaspoons of honey with juice squeezed from half lemon. Place shaved pieces of ginger into 1/2-1 cup of water and warm over stove top. Then mix the ingredients and repeat every 4 hours as needed. Please take Tylenol 500mg -650mg  every 6 hours for aches and pains, fevers. Hydrate very well with at least 2 liters of water. Eat light meals such as soups to replenish electrolytes and soft fruits, veggies. Start an antihistamine like Zyrtec, Allegra or Claritin for postnasal drainage, sinus congestion.

## 2020-11-25 LAB — COVID-19, FLU A+B NAA
Influenza A, NAA: NOT DETECTED
Influenza B, NAA: NOT DETECTED
SARS-CoV-2, NAA: DETECTED — AB

## 2020-12-06 ENCOUNTER — Other Ambulatory Visit: Payer: Self-pay

## 2020-12-06 ENCOUNTER — Encounter: Payer: Self-pay | Admitting: Emergency Medicine

## 2020-12-06 ENCOUNTER — Ambulatory Visit
Admission: EM | Admit: 2020-12-06 | Discharge: 2020-12-06 | Disposition: A | Discharge status: Home / Self Care | Payer: 59 | Attending: Family Medicine | Admitting: Family Medicine

## 2020-12-06 DIAGNOSIS — R059 Cough, unspecified: Secondary | ICD-10-CM

## 2020-12-06 DIAGNOSIS — R0981 Nasal congestion: Secondary | ICD-10-CM | POA: Diagnosis not present

## 2020-12-06 DIAGNOSIS — R062 Wheezing: Secondary | ICD-10-CM | POA: Diagnosis not present

## 2020-12-06 DIAGNOSIS — J0100 Acute maxillary sinusitis, unspecified: Secondary | ICD-10-CM | POA: Diagnosis not present

## 2020-12-06 MED ORDER — AMOXICILLIN-POT CLAVULANATE 875-125 MG PO TABS: 1.0000 | ORAL_TABLET | Freq: Two times a day (BID) | ORAL | 0 refills | Status: AC

## 2020-12-06 MED ORDER — FLUCONAZOLE 150 MG PO TABS: ORAL_TABLET | ORAL | 0 refills | Status: DC

## 2020-12-06 MED ORDER — DEXAMETHASONE SODIUM PHOSPHATE 10 MG/ML IJ SOLN
10.0000 mg | Freq: Once | INTRAMUSCULAR | Status: AC
  Administered 2020-12-06: 10 mg via INTRAMUSCULAR

## 2020-12-06 NOTE — ED Triage Notes (Signed)
Sinus congestion and pressure that started approx a week ago.  Pt had covid x 3 weeks ago

## 2020-12-06 NOTE — ED Provider Notes (Signed)
[place]   [id number] 12/06/20 Arrival Time: [id number]  [id number] THROAT  SUBJECTIVE: History from: patient.  Brenda [name] is a 50 y.o. female who presents with abrupt onset of nasal congestion, headache, fatigue, sinus pain x 7 days. Reports that she had Covid x 3 weeks ago. Has taken OTC cough and cold without relief. Denies sick exposure to Covid, strep, flu or mono, or precipitating event. Has not completed Covid vaccines.  There are no aggravating symptoms. Denies previous symptoms in the past.     Denies fever, chills, ear pain, SOB, chest pain, nausea, rash, changes in bowel or bladder habits.     ROS: As per HPI.  All other pertinent ROS negative.     Past Medical History:  Diagnosis Date  . Asthma   . GERD (gastroesophageal reflux disease)   . Seasonal allergies    Past Surgical History:  Procedure Laterality Date  . ABDOMINAL HYSTERECTOMY    . BACK SURGERY     3-4 yrs ago.  Marland Kitchen BIOPSY  03/01/2020   Procedure: BIOPSY;  Surgeon: Rogene Houston, MD;  [facility];  Service: Endoscopy;;  . BLADDER SURGERY     incontinence  . BREAST REDUCTION SURGERY Bilateral 08/30/2015   Procedure: BREAST REDUCTION WITH LIPOSUCTION;  Surgeon: [name], MD;  Location: [facility];  Service: Plastics;  Laterality: Bilateral;  . COLONOSCOPY N/A 03/01/2020   Procedure: COLONOSCOPY;  Surgeon: Rogene Houston, MD;  [facility];  Service: Endoscopy;  Laterality: N/A;  730  . EGD/ED     5 yr ago by Dr. Laural Golden  . ESOPHAGEAL DILATION N/A 09/05/2017   Procedure: ESOPHAGEAL DILATION;  Surgeon: Rogene Houston, MD;  [facility];  Service: Endoscopy;  Laterality: N/A;  . ESOPHAGOGASTRODUODENOSCOPY N/A 09/05/2017   Procedure: ESOPHAGOGASTRODUODENOSCOPY (EGD);  Surgeon: Rogene Houston, MD;  [facility];  Service: Endoscopy;  Laterality: N/A;  1:00  . HIGH RISK BREAST EXCISION Right 2011  . REDUCTION MAMMAPLASTY Bilateral 2016    Allergies  Allergen Reactions  . Other Other (See Comments)    Grass ,  Bed mites,   Dogs  And  Cats.   . Adhesive [Tape] Rash  . Latex Rash   No current facility-administered medications on file prior to encounter.   Current Outpatient Medications on File Prior to Encounter  Medication Sig Dispense Refill  . albuterol (PROAIR HFA) 108 (90 Base) MCG/ACT inhaler Inhale 2 puffs into the lungs every 4 (four) hours as needed for wheezing or shortness of breath. 16 g 0  . albuterol (PROVENTIL) (2.5 MG/3ML) 0.083% nebulizer solution Use q 4 hrs via nebulizer prn wheezing 75 mL 0  . APPLE CIDER VINEGAR PO Take 2 tablets by mouth in the morning and at bedtime. Goli Apple Cider Gummy    . B Complex-C (B-COMPLEX WITH VITAMIN C) tablet Take 1 tablet by mouth daily.    . benzonatate (TESSALON) 100 MG capsule Take 1-2 capsules (100-200 mg total) by mouth 3 (three) times daily as needed. 60 capsule 0  . Black Cohosh 540 MG CAPS Take 540 mg by mouth daily.    . Cholecalciferol (VITAMIN D3) 50 MCG (2000 UT) TABS Take 2,000 Units by mouth daily.    . fluticasone (FLOVENT HFA) 44 MCG/ACT inhaler Inhale 2 puffs into the lungs 2 (two) times daily as needed (respiratory issues.). 1 Inhaler 5  . loratadine-pseudoephedrine (CLARITIN-D 12-HOUR) 5-120 MG tablet Take 1 tablet by mouth daily.    Marland Kitchen  naproxen sodium (ALEVE) 220 MG tablet Take 2 tablets (440 mg total) by mouth 2 (two) times daily as needed (pain).    Marland Kitchen oseltamivir (TAMIFLU) 75 MG capsule Take 1 capsule (75 mg total) by mouth every 12 (twelve) hours. 10 capsule 0  . pantoprazole (PROTONIX) 40 MG tablet TAKE 1 TABLET BY MOUTH EVERY DAY BEFORE BREAKFAST 90 tablet 3  . polyethylene glycol (MIRALAX / GLYCOLAX) 17 g packet Take 17 g by mouth daily.    . promethazine-dextromethorphan (PROMETHAZINE-DM) 6.25-15 MG/5ML syrup Take 5 mLs by mouth at bedtime as needed for cough. 100 mL 0  . triamcinolone cream (KENALOG) 0.1 % Apply to affected area BID (Patient  taking differently: Apply 1 application topically 2 (two) times daily as needed (skin irritation/rash). ) 30 g 2  . vitamin C (ASCORBIC ACID) 250 MG tablet Take 500 mg by mouth daily. (Patient not taking: Reported on 09/17/2020)     Social History   Socioeconomic History  . Marital status: Married    Spouse name: Not on file  . Number of children: Not on file  . Years of education: Not on file  . Highest education level: Not on file  Occupational History  . Not on file  Tobacco Use  . Smoking status: Never Smoker  . Smokeless tobacco: Never Used  Vaping Use  . Vaping Use: Never used  Substance and Sexual Activity  . Alcohol use: No    Comment: rarely   . Drug use: No  . Sexual activity: Not on file  Other Topics Concern  . Not on file  Social History Narrative  . Not on file   Social Determinants of Health   Financial Resource Strain: Not on file  Food Insecurity: Not on file  Transportation Needs: Not on file  Physical Activity: Not on file  Stress: Not on file  Social Connections: Not on file  Intimate Partner Violence: Not on file   Family History  Problem Relation Age of Onset  . Cancer Mother 63       endometrial cancer and cervical cancer   . Cancer Maternal Grandmother        prostate cancer    OBJECTIVE:  Vitals:   12/06/20 1522  BP: 130/80  Pulse: 75  Resp: 16  Temp: 98.3 F (36.8 C)  TempSrc: Oral  SpO2: 98%     General appearance: alert; appears fatigued, but nontoxic, speaking in full sentences and managing own secretions HEENT: NCAT; Ears: EACs clear, TMs pearly gray with visible cone of light, without erythema; Eyes: PERRL, EOMI grossly; Nose: no obvious rhinorrhea; Throat: oropharynx erythematous, cobblestoning present, tonsils 1+ and mildly erythematous without white tonsillar exudates, uvula midline; Sinuses: maxillary sinusestender to palpation Neck: supple without LAD Lungs: CTA bilaterally without adventitious breath sounds; cough  absent Heart: regular rate and rhythm.  Radial pulses 2+ symmetrical bilaterally Skin: warm and dry Psychological: alert and cooperative; normal mood and affect  LABS: No results found for this or any previous visit (from the past 24 hour(s)).   ASSESSMENT & PLAN:  1. Acute non-recurrent maxillary sinusitis   2. Nasal congestion   3. Cough   4. Wheezing     Meds ordered this encounter  Medications  . dexamethasone (DECADRON) injection 10 mg  . amoxicillin-clavulanate (AUGMENTIN) 875-125 MG tablet    Sig: Take 1 tablet by mouth 2 (two) times daily for 7 days.    Dispense:  14 tablet    Refill:  0    Order  Specific Question:   Supervising Provider    Answer:   Chase Picket [id number]  . fluconazole (DIFLUCAN) 150 MG tablet    Sig: Take one tablet at the onset of symptoms. If symptoms are still present 3 days later, take the second tablet.    Dispense:  2 tablet    Refill:  0    Order Specific Question:   Supervising Provider    Answer:   Chase Picket [id number]   Decadron 10mg  IM in office today Acute Sinusitis Push fluids and get rest Prescribed amoxicillin 875mg  twice daily for 7 days.   Fluconazole prescribed in case of yeast with antibiotics Take as directed and to completion.  Drink warm or cool liquids, use throat lozenges, or popsicles to help alleviate symptoms Take OTC ibuprofen or tylenol as needed for pain May use Zyrtec D and flonase to help alleviate symptoms Follow up with PCP if symptoms persist Return or go to ER if you have any new or worsening symptoms such as fever, chills, nausea, vomiting, worsening sore throat, cough, abdominal pain, chest pain, changes in bowel or bladder habits.   Reviewed expectations re: course of current medical issues. Questions answered. Outlined signs and symptoms indicating need for more acute intervention. Patient verbalized understanding. After Visit Summary given.          Faustino Congress,  NP 12/08/20 2114

## 2020-12-06 NOTE — Discharge Instructions (Addendum)
You have received a steroid injection in the office today  I have sent in Augmentin for you to take twice a day for 7 days.  I have sent in fluconazole in case of yeast. Take one tablet at the onset of symptoms. If symptoms are still present in 3 days, take the second tablet.   Follow up with this office or with primary care if symptoms are persisting.  Follow up in the ER for high fever, trouble swallowing, trouble breathing, other concerning symptoms.

## 2020-12-13 ENCOUNTER — Telehealth: Payer: Self-pay | Admitting: Family Medicine

## 2020-12-13 NOTE — Telephone Encounter (Signed)
Pharmacy requesting refill on Flovent inhaler. Use BID prn. Pt last seen in October for wellness. Please advise. Thank you.

## 2020-12-14 MED ORDER — FLOVENT HFA 44 MCG/ACT IN AERO
2.0000 | INHALATION_SPRAY | Freq: Two times a day (BID) | RESPIRATORY_TRACT | 2 refills | Status: DC | PRN
Start: 2020-12-14 — End: 2023-12-11

## 2020-12-14 NOTE — Addendum Note (Signed)
Addended by: Erven Colla on: 12/14/2020 01:51 PM   Modules accepted: Orders

## 2020-12-22 ENCOUNTER — Telehealth: Payer: Self-pay | Admitting: Family Medicine

## 2020-12-22 ENCOUNTER — Other Ambulatory Visit: Payer: Self-pay

## 2020-12-22 ENCOUNTER — Encounter: Payer: Self-pay | Admitting: Family Medicine

## 2020-12-22 ENCOUNTER — Telehealth (INDEPENDENT_AMBULATORY_CARE_PROVIDER_SITE_OTHER): Payer: 59 | Admitting: Family Medicine

## 2020-12-22 DIAGNOSIS — J40 Bronchitis, not specified as acute or chronic: Secondary | ICD-10-CM | POA: Diagnosis not present

## 2020-12-22 DIAGNOSIS — J45909 Unspecified asthma, uncomplicated: Secondary | ICD-10-CM | POA: Diagnosis not present

## 2020-12-22 MED ORDER — FLUTICASONE PROPIONATE 50 MCG/ACT NA SUSP: 2.0000 | Freq: Every day | NASAL | 6 refills | Status: DC

## 2020-12-22 MED ORDER — PREDNISONE 20 MG PO TABS: ORAL_TABLET | ORAL | 0 refills | Status: DC

## 2020-12-22 MED ORDER — ALBUTEROL SULFATE HFA 108 (90 BASE) MCG/ACT IN AERS
2.0000 | INHALATION_SPRAY | RESPIRATORY_TRACT | 1 refills | Status: DC | PRN
Start: 2020-12-22 — End: 2023-12-11

## 2020-12-22 NOTE — Telephone Encounter (Signed)
Brenda [name], Brenda [name] are scheduled for a virtual visit with your provider today.    Just as we do with appointments in the office, we must obtain your consent to participate.  Your consent will be active for this visit and any virtual visit you may have with one of our providers in the next 365 days.    If you have a MyChart account, I can also send a copy of this consent to you electronically.  All virtual visits are billed to your insurance company just like a traditional visit in the office.  As this is a virtual visit, video technology does not allow for your provider to perform a traditional examination.  This may limit your provider's ability to fully assess your condition.  If your provider identifies any concerns that need to be evaluated in person or the need to arrange testing such as labs, EKG, etc, we will make arrangements to do so.    Although advances in technology are sophisticated, we cannot ensure that it will always work on either your end or our end.  If the connection with a video visit is poor, we may have to switch to a telephone visit.  With either a video or telephone visit, we are not always able to ensure that we have a secure connection.   I need to obtain your verbal consent now.   Are you willing to proceed with your visit today?   Satya SABRIA FLORIDO has provided verbal consent on 12/22/2020 for a virtual visit (video or telephone).   Vicente Males, LPN 03/11/6807  8:11 AM

## 2020-12-22 NOTE — Progress Notes (Signed)
Pt had COVID back in December. Went to Urgent Care and was tested. Pt is having cough, headache, head stopped up, eyes swollen and red. Pt is blowing and coughing up everything. Pt has been taking Sudafed and that has helped some. Pt also having some wheezing.   Virtual Visit via Telephone Note  I connected with Brenda [name] on 12/22/20 at 10:00 AM EST by telephone and verified that I am speaking with the correct person using two identifiers.  Location: Patient: work Provider: office   I discussed the limitations, risks, security and privacy concerns of performing an evaluation and management service by telephone and the availability of in person appointments. I also discussed with the patient that there may be a patient responsible charge related to this service. The patient expressed understanding and agreed to proceed.   History of Present Illness:    Observations/Objective:   Assessment and Plan:   Follow Up Instructions:    I discussed the assessment and treatment plan with the patient. The patient was provided an opportunity to ask questions and all were answered. The patient agreed with the plan and demonstrated an understanding of the instructions.   The patient was advised to call back or seek an in-person evaluation if the symptoms worsen or if the condition fails to improve as anticipated.  I provided 20 minutes of non-face-to-face time during this encounter.     Patient ID: Brenda [name], female    DOB: July 27, 1971, 50 y.o.   MRN: [id number]   Chief Complaint  Patient presents with  . Cough   Subjective:  CC: Covid positive in December. Still with head stopped up  This is not a new problem.  Presents today via telephone visit with a complaint of tested COVID-positive on December 20 and continues to have a stopped up head, red and swollen eyes, continuing cough.  Denies fever, fatigue, chills.  Reports that she has seasonal allergies, she was having these  symptoms before she tested positive for COVID.  She went to the urgent care on December 06, 2020 where she received amoxicillin for 7 days and a steroid shot.  They treated her for sinusitis.  She has also been taking Mucinex DM, and Sudafed for the last 2 weeks.  Has a history of allergies, asthma, has been taking her albuterol inhaler daily, ibuprofen for the headache which is helping.  She feels that her symptoms are in her head and bronchials.    Medical History Brenda [name] has a past medical history of Asthma, GERD (gastroesophageal reflux disease), and Seasonal allergies.   Outpatient Encounter Medications as of 12/22/2020  Medication Sig  . albuterol (PROVENTIL) (2.5 MG/3ML) 0.083% nebulizer solution Use q 4 hrs via nebulizer prn wheezing  . APPLE CIDER VINEGAR PO Take 2 tablets by mouth in the morning and at bedtime. [place]oli Apple Cider Gummy  . B Complex-C (B-COMPLEX WITH VITAMIN C) tablet Take 1 tablet by mouth daily.  . benzonatate (TESSALON) 100 MG capsule Take 1-2 capsules (100-200 mg total) by mouth 3 (three) times daily as needed.  . Black Cohosh 540 MG CAPS Take 540 mg by mouth daily.  . Cholecalciferol (VITAMIN D3) 50 MCG (2000 UT) TABS Take 2,000 Units by mouth daily.  . fluticasone (FLONASE) 50 MCG/ACT nasal spray Place 2 sprays into both nostrils daily.  . fluticasone (FLOVENT HFA) 44 MCG/ACT inhaler Inhale 2 puffs into the lungs 2 (two) times daily as needed (respiratory issues.).  Marland Kitchen loratadine-pseudoephedrine (CLARITIN-D 12-HOUR) 5-120 MG tablet Take  1 tablet by mouth daily.  . naproxen sodium (ALEVE) 220 MG tablet Take 2 tablets (440 mg total) by mouth 2 (two) times daily as needed (pain).  . pantoprazole (PROTONIX) 40 MG tablet TAKE 1 TABLET BY MOUTH EVERY DAY BEFORE BREAKFAST  . polyethylene glycol (MIRALAX / GLYCOLAX) 17 g packet Take 17 g by mouth daily.  . predniSONE (DELTASONE) 20 MG tablet Take three tablets by mouth for three days, then two tablets by mouth for three days,  then one tablet by mouth for three days.  . promethazine-dextromethorphan (PROMETHAZINE-DM) 6.25-15 MG/5ML syrup Take 5 mLs by mouth at bedtime as needed for cough.  . triamcinolone cream (KENALOG) 0.1 % Apply to affected area BID (Patient taking differently: Apply 1 application topically 2 (two) times daily as needed (skin irritation/rash).)  . vitamin C (ASCORBIC ACID) 250 MG tablet Take 500 mg by mouth daily.  . [DISCONTINUED] albuterol (PROAIR HFA) 108 (90 Base) MCG/ACT inhaler Inhale 2 puffs into the lungs every 4 (four) hours as needed for wheezing or shortness of breath.  Marland Kitchen albuterol (PROAIR HFA) 108 (90 Base) MCG/ACT inhaler Inhale 2 puffs into the lungs every 4 (four) hours as needed for wheezing or shortness of breath.  . [DISCONTINUED] fluconazole (DIFLUCAN) 150 MG tablet Take one tablet at the onset of symptoms. If symptoms are still present 3 days later, take the second tablet.  . [DISCONTINUED] oseltamivir (TAMIFLU) 75 MG capsule Take 1 capsule (75 mg total) by mouth every 12 (twelve) hours.   No facility-administered encounter medications on file as of 12/22/2020.     Review of Systems  Constitutional: Negative for chills, fatigue and fever.  HENT: Positive for congestion, ear pain, postnasal drip, sinus pressure, sinus pain and sore throat.        Stopped up ears with off and on left ear pain  Respiratory: Positive for cough and wheezing. Negative for shortness of breath.   Cardiovascular: Positive for chest pain.       Left side of chest pain  Gastrointestinal: Negative for abdominal pain.  Neurological: Positive for headaches.     Vitals LMP 07/06/2004  Unable  Objective:   Physical Exam  Unable-able to converse throughout interview without obvious shortness of breath. Assessment and Plan   1. Bronchitis - albuterol (PROAIR HFA) 108 (90 Base) MCG/ACT inhaler; Inhale 2 puffs into the lungs every 4 (four) hours as needed for wheezing or shortness of breath.   Dispense: 16 g; Refill: 1 - predniSONE (DELTASONE) 20 MG tablet; Take three tablets by mouth for three days, then two tablets by mouth for three days, then one tablet by mouth for three days.  Dispense: 18 tablet; Refill: 0  2. Asthma due to seasonal allergies - fluticasone (FLONASE) 50 MCG/ACT nasal spray; Place 2 sprays into both nostrils daily.  Dispense: 16 g; Refill: 6   Recommend prednisone taper for bronchitis, refill sent for albuterol.  Also recommend supportive therapy, with humidification, saline nasal flushes, Flonase and adequate hydration.  She understands that at this time another antibiotic is not indicated.  She will stop taking Sudafed.  Agrees with plan of care discussed today. Understands warning signs to seek further care: chest pain, shortness of breath, any significant change in health.  Understands to follow-up if symptoms do not improve, or worsen with recommended therapy.  Recommend if symptoms do not improve, she make an in person visit for a  physical assessment with auscultation of lungs.   Recommend supportive therapy while you are recovering:  1) Get lots of rest.  2) Take over the counter pain medication if needed, such as acetaminophen or ibuprofen. Read and follow instructions on the label and make sure not to combine other medications that may have same ingredients in it. It is important to not take too much of these ingredients.  3) Drink plenty of caffeine-free fluids. (If you have heart or kidney problems, follow the instructions of your specialist regarding amounts).  4) If you are hungry, eat a bland diet, such as the BRAT diet (bananas, rice, applesauce, toast).  5) Let us know if you are not feeling better in a week.    Chalmers Guest, NP 12/22/2020

## 2021-02-26 ENCOUNTER — Other Ambulatory Visit (INDEPENDENT_AMBULATORY_CARE_PROVIDER_SITE_OTHER): Payer: Self-pay | Admitting: Internal Medicine

## 2021-05-24 ENCOUNTER — Other Ambulatory Visit: Payer: Self-pay | Admitting: Family Medicine

## 2021-05-24 DIAGNOSIS — Z1231 Encounter for screening mammogram for malignant neoplasm of breast: Secondary | ICD-10-CM

## 2021-08-31 ENCOUNTER — Encounter: Payer: Self-pay | Admitting: Women's Health

## 2021-08-31 ENCOUNTER — Other Ambulatory Visit: Payer: Self-pay

## 2021-08-31 ENCOUNTER — Ambulatory Visit: Payer: 59 | Admitting: Women's Health

## 2021-08-31 VITALS — BP 131/86 | HR 73 | Ht 66.5 in | Wt 212.0 lb

## 2021-08-31 DIAGNOSIS — N941 Unspecified dyspareunia: Secondary | ICD-10-CM

## 2021-08-31 DIAGNOSIS — N951 Menopausal and female climacteric states: Secondary | ICD-10-CM | POA: Diagnosis not present

## 2021-08-31 DIAGNOSIS — R102 Pelvic and perineal pain: Secondary | ICD-10-CM | POA: Diagnosis not present

## 2021-08-31 MED ORDER — PAROXETINE MESYLATE 7.5 MG PO CAPS: 1.0000 | ORAL_CAPSULE | Freq: Every day | ORAL | 11 refills | Status: DC

## 2021-08-31 NOTE — Progress Notes (Signed)
GYN VISIT Patient name: Brenda [name] MRN [id number]  Date of birth: 08/04/1971 Chief Complaint:   Gynecologic Exam (Wants pelvic exam, had a cyst but its gone now. )  History of Present Illness:   Brenda [name] is a 50 y.o. G72P2012 Caucasian female being seen today for report of cyst inside vagina more on Lt side, but has gone away. Still wants to get 'checked out'. Had hysterectomy w/ removal of cervix @ 50yo d/t fibroids, states surgeon told her that if it would have gone another year she would have cancer. Still has ovaries. Has family h/o 'cancers down there', not sure what exactly. Denies abnormal discharge, itching/odor/irritation.  Does have some lower abdominal/pelvic pain, Lt >Rt. Pain w/ sex, but is seldom sexually active. Constipation- takes miralax which helps some.  Hot flashes, trouble sleeping, mood swings. Has tried otc relief measures, nothing helps.    Patient's last menstrual period was 07/06/2004. The current method of family planning is status post hysterectomy.   Depression screen [facility] 2/9 08/31/2021 09/17/2020 09/24/2017 09/24/2017  Decreased Interest 1 0 2 1  Down, Depressed, Hopeless 3 0 2 1  PHQ - 2 Score 4 0 4 2  Altered sleeping 3 3 3  -  Tired, decreased energy 3 1 2  -  Change in appetite 0 1 3 -  Feeling bad or failure about yourself  0 0 3 -  Trouble concentrating 0 0 2 -  Moving slowly or fidgety/restless 1 0 2 -  Suicidal thoughts 0 0 2 -  PHQ-9 Score 11 5 21  -  Difficult doing work/chores - Somewhat difficult Somewhat difficult -     GAD 7 : Generalized Anxiety Score 09/17/2020  Nervous, Anxious, on Edge 1  Control/stop worrying 1  Worry too much - different things 1  Trouble relaxing 3  Restless 3  Easily annoyed or irritable 1  Afraid - awful might happen 0  Total GAD 7 Score 10  Anxiety Difficulty Somewhat difficult     Review of Systems:   Pertinent items are noted in HPI Denies fever/chills, dizziness, headaches, visual disturbances,  fatigue, shortness of breath, chest pain, abdominal pain, vomiting, abnormal vaginal discharge/itching/odor/irritation, problems with periods, bowel movements, urination, or intercourse unless otherwise stated above.  Pertinent History Reviewed:  Reviewed past medical,surgical, social, obstetrical and family history.  Reviewed problem list, medications and allergies. Physical Assessment:   Vitals:   08/31/21 0911  BP: 131/86  Pulse: 73  Weight: 212 lb (96.2 kg)  Height: 5' 6.5" (1.689 m)  Body mass index is 33.71 kg/m.       Physical Examination:   General appearance: alert, well appearing, and in no distress  Mental status: alert, oriented to person, place, and time  Skin: warm & dry   Cardiovascular: normal heart rate noted  Respiratory: normal respiratory effort, no distress  Abdomen: soft, non-tender   Pelvic: VULVA: normal appearing vulva with no masses, tenderness or lesions, VAGINA: normal appearing vagina with normal color and discharge, no lesions, CERVIX: surgically absent, UTERUS: uterus is normal size, shape, consistency and nontender, surgically absent, vaginal cuff well healed, ADNEXA: normal adnexa in size, no masses, bilateral tenderness, Rt >Lt  Extremities: no edema   Chaperone: Celene Squibb    No results found for this or any previous visit (from the past 24 hour(s)).  Assessment & Plan:  1) Resolved vaginal cyst> could have possibly been Bartholin's, nothing apparent right now  2) S/P hysterectomy in 30s> for fibroids, will get op  note from [facility]  3) Menopausal hot flashes, mood swings, trouble sleeping> discussed [place], wants to try  4) Low abd/pelvic pain and dyspareunia> will get pelvic u/s  Meds:  Meds ordered this encounter  Medications   PARoxetine Mesylate (BRISDELLE) 7.5 MG CAPS    Sig: Take 1 capsule by mouth daily.    Dispense:  30 capsule    Refill:  11    Order Specific Question:   Supervising Provider    Answer:   Tania Ade H  [[id number]]    Orders Placed This Encounter  Procedures   US PELVIS (TRANSABDOMINAL ONLY)   US PELVIS TRANSVAGINAL NON-OB (TV ONLY)    Return for 1st available gyn u/s and f/u after; get hysterectomy records from [facility].  [place], [facility] 08/31/2021 9:55 AM

## 2021-09-02 ENCOUNTER — Encounter: Payer: Self-pay | Admitting: Women's Health

## 2021-09-02 DIAGNOSIS — Z90710 Acquired absence of both cervix and uterus: Secondary | ICD-10-CM | POA: Insufficient documentation

## 2021-10-10 ENCOUNTER — Other Ambulatory Visit: Payer: 59

## 2021-10-10 ENCOUNTER — Ambulatory Visit: Payer: 59 | Admitting: Women's Health

## 2021-10-31 ENCOUNTER — Other Ambulatory Visit: Payer: Self-pay

## 2021-10-31 ENCOUNTER — Ambulatory Visit
Admission: EM | Admit: 2021-10-31 | Discharge: 2021-10-31 | Disposition: A | Discharge status: Home / Self Care | Payer: 59 | Attending: Family Medicine | Admitting: Family Medicine

## 2021-10-31 DIAGNOSIS — J4521 Mild intermittent asthma with (acute) exacerbation: Secondary | ICD-10-CM | POA: Diagnosis not present

## 2021-10-31 DIAGNOSIS — U071 COVID-19: Secondary | ICD-10-CM

## 2021-10-31 MED ORDER — PROMETHAZINE-DM 6.25-15 MG/5ML PO SYRP: 5.0000 mL | ORAL_SOLUTION | Freq: Four times a day (QID) | ORAL | 0 refills | Status: DC | PRN

## 2021-10-31 NOTE — ED Provider Notes (Addendum)
[facility]    CSN: [id number] Arrival date & time: 10/31/21  2130      History   Chief Complaint Chief Complaint  Patient presents with   Cough   Sore Throat    HPI Brenda [name] is a 50 y.o. female.   Presenting today with several day history of sore throat, cough, body aches, chills, fatigue, weakness.  Denies chest pain, shortness of breath, abdominal pain, nausea vomiting or diarrhea.  Tested positive for COVID at home this morning.  So far taking over-the-counter pain and fever reducers, cold and congestion medications with mild temporary relief.  Does have a history of seasonal allergies and asthma, has albuterol at home but has not used it since onset of illness.   Past Medical History:  Diagnosis Date   Asthma    GERD (gastroesophageal reflux disease)    Seasonal allergies     Patient Active Problem List   Diagnosis Date Noted   Hx of hysterectomy 09/02/2021   Bronchitis 12/22/2020   Asthma due to seasonal allergies 12/22/2020   Esophageal dysphagia 05/29/2017   Gastroesophageal reflux disease without esophagitis 05/29/2017   Atypical ductal hyperplasia of right breast 11/04/2015   Hot flushes, perimenopausal 11/04/2015   Morbid obesity ([place]) 04/30/2015   Chronic upper back pain 04/30/2015   GERD (gastroesophageal reflux disease) 05/14/2014   Asthma, chronic obstructive, with acute exacerbation ([place]) 05/08/2014   Anxiety and depression 04/08/2014   Allergic rhinitis 04/01/2014   Reactive airways dysfunction syndrome ([place]) 04/01/2014   Rhinitis medicamentosa 10/14/2013   BACK PAIN 05/12/2009   SPONDYLOLYSIS 05/12/2009   SPONDYLOLITHESIS 05/12/2009    Past Surgical History:  Procedure Laterality Date   ABDOMINAL HYSTERECTOMY     BACK SURGERY     3-4 yrs ago.   BIOPSY  03/01/2020   Procedure: BIOPSY;  Surgeon: Rogene Houston, MD;  [facility];  Service: Endoscopy;;   BLADDER SURGERY     incontinence   BREAST REDUCTION  SURGERY Bilateral 08/30/2015   Procedure: BREAST REDUCTION WITH LIPOSUCTION;  Surgeon: [name], MD;  Location: [place];  Service: Plastics;  Laterality: Bilateral;   COLONOSCOPY N/A 03/01/2020   Procedure: COLONOSCOPY;  Surgeon: Rogene Houston, MD;  [facility];  Service: Endoscopy;  Laterality: N/A;  730   EGD/ED     5 yr ago by Dr. Laural Golden   ESOPHAGEAL DILATION N/A 09/05/2017   Procedure: ESOPHAGEAL DILATION;  Surgeon: Rogene Houston, MD;  [facility];  Service: Endoscopy;  Laterality: N/A;   ESOPHAGOGASTRODUODENOSCOPY N/A 09/05/2017   Procedure: ESOPHAGOGASTRODUODENOSCOPY (EGD);  Surgeon: Rogene Houston, MD;  [facility];  Service: Endoscopy;  Laterality: N/A;  1:00   HIGH RISK BREAST EXCISION Right 2011   REDUCTION MAMMAPLASTY Bilateral 2016    OB History     Gravida  3   Para  2   Term  2   Preterm      AB  1   Living  2      SAB      IAB      Ectopic      Multiple      Live Births               Home Medications    Prior to Admission medications   Medication Sig Start Date End Date Taking? Authorizing Provider  promethazine-dextromethorphan (PROMETHAZINE-DM) 6.25-15 MG/5ML syrup Take 5 mLs by mouth 4 (four) times daily as  needed. 10/31/21  Yes Volney American, PA-C  albuterol [facility]) 108 [phone] Base) MCG/ACT inhaler Inhale 2 puffs into the lungs every 4 (four) hours as needed for wheezing or shortness of breath. 12/22/20   Chalmers Guest, NP  albuterol (PROVENTIL) (2.5 MG/3ML) 0.083% nebulizer solution Use q 4 hrs via nebulizer prn wheezing 11/22/20   Jaynee Eagles, PA-C  B Complex-C (B-COMPLEX WITH VITAMIN C) tablet Take 1 tablet by mouth daily.    [provider]  Cholecalciferol (VITAMIN D3) 50 MCG (2000 UT) TABS Take 2,000 Units by mouth daily.    [provider]  fluticasone (FLONASE) 50 MCG/ACT nasal spray Place 2 sprays into both nostrils daily. 12/22/20   Chalmers Guest, NP  fluticasone (FLOVENT HFA) 44 MCG/ACT inhaler Inhale 2 puffs into the lungs 2 (two) times daily as needed (respiratory issues.). 12/14/20   Elvia Collum M, DO  loratadine (CLARITIN) 10 MG tablet Take 10 mg by mouth daily.    [provider]  pantoprazole (PROTONIX) 40 MG tablet TAKE 1 TABLET BY MOUTH EVERY DAY BEFORE BREAKFAST 02/28/21   Rehman, Mechele Dawley, MD  PARoxetine Mesylate (BRISDELLE) 7.5 MG CAPS Take 1 capsule by mouth daily. 08/31/21   Roma Schanz, CNM  polyethylene glycol (MIRALAX / GLYCOLAX) 17 g packet Take 17 g by mouth daily.    [provider]  triamcinolone cream (KENALOG) 0.1 % Apply to affected area BID Patient not taking: Reported on 08/31/2021 01/08/20   [name], MD    Family History Family History  Problem Relation Age of Onset   Cancer Mother 91       endometrial cancer and cervical cancer    Cancer Maternal Grandmother        prostate cancer    Social History Social History   Tobacco Use   Smoking status: Never   Smokeless tobacco: Never  Vaping Use   Vaping Use: Never used  Substance Use Topics   Alcohol use: No    Comment: rarely    Drug use: No     Allergies   Other, Adhesive [tape], and Latex   Review of Systems Review of Systems Per HPI  Physical Exam Triage Vital Signs ED Triage Vitals  Enc Vitals Group     BP 10/31/21 0854 136/87     Pulse Rate 10/31/21 0854 80     Resp 10/31/21 0854 20     Temp 10/31/21 0854 98 F (36.7 C)     Temp src --      SpO2 10/31/21 0854 98 %     Weight --      Height --      Head Circumference --      Peak Flow --      Pain Score 10/31/21 0853 0     Pain Loc --      Pain Edu? --      Excl. in [place]? --    No data found.  Updated Vital Signs BP 136/87   Pulse 80   Temp 98 F (36.7 C)   Resp 20   LMP 07/06/2004   SpO2 98%   Visual Acuity Right Eye Distance:   Left Eye Distance:   Bilateral Distance:    Right Eye Near:   Left Eye Near:     Bilateral Near:     Physical Exam Vitals and nursing note reviewed.  Constitutional:      Appearance: Normal appearance.  HENT:  Head: Atraumatic.     Right Ear: Tympanic membrane and external ear normal.     Left Ear: Tympanic membrane and external ear normal.     Nose: Rhinorrhea present.     Mouth/Throat:     Mouth: Mucous membranes are moist.     Pharynx: Posterior oropharyngeal erythema present.  Eyes:     Extraocular Movements: Extraocular movements intact.     Conjunctiva/sclera: Conjunctivae normal.  Cardiovascular:     Rate and Rhythm: Normal rate and regular rhythm.     Heart sounds: Normal heart sounds.  Pulmonary:     Effort: Pulmonary effort is normal.     Breath sounds: Normal breath sounds. No wheezing or rales.  Musculoskeletal:        General: Normal range of motion.     Cervical back: Normal range of motion and neck supple.  Skin:    General: Skin is warm and dry.  Neurological:     Mental Status: She is alert and oriented to person, place, and time.  Psychiatric:        Mood and Affect: Mood normal.        Thought Content: Thought content normal.   UC Treatments / Results  Labs (all labs ordered are listed, but only abnormal results are displayed) Labs Reviewed - No data to display  EKG   Radiology No results found.  Procedures Procedures (including critical care time)  Medications Ordered in UC Medications - No data to display  Initial Impression / Assessment and Plan / UC Course  I have reviewed the triage vital signs and the nursing notes.  Pertinent labs & imaging results that were available during my care of the patient were reviewed by me and considered in my medical decision making (see chart for details).     Vitals and exam overall reassuring, consistent with COVID-19 diagnosis.  We will treat with Phenergan DM, supportive over-the-counter medications and home care.  Discussion was had regarding antiviral therapy and this was  declined with shared decision making.  Return for acutely worsening symptoms.  Work note given.  Final Clinical Impressions(s) / UC Diagnoses   Final diagnoses:  COVID-19  Mild intermittent asthma with acute exacerbation   Discharge Instructions   None    ED Prescriptions     Medication Sig Dispense Auth. Provider   promethazine-dextromethorphan (PROMETHAZINE-DM) 6.25-15 MG/5ML syrup Take 5 mLs by mouth 4 (four) times daily as needed. 100 mL Volney American, [place]      PDMP not reviewed this encounter.   Volney American, PA-C 10/31/21 [place], Cristo [place], [place] 10/31/21 1103

## 2021-10-31 NOTE — ED Triage Notes (Signed)
Pt presents with cough and sore throat  and has been feeling bad for past few days , pt states she tested positive for covid this morning

## 2021-11-29 ENCOUNTER — Other Ambulatory Visit: Payer: Self-pay | Admitting: *Deleted

## 2021-11-29 ENCOUNTER — Other Ambulatory Visit: Payer: Self-pay | Admitting: Women's Health

## 2021-11-29 MED ORDER — PAROXETINE MESYLATE 7.5 MG PO CAPS: 1.0000 | ORAL_CAPSULE | Freq: Every day | ORAL | 3 refills | Status: DC

## 2021-11-29 NOTE — Telephone Encounter (Signed)
Pts insurance requires 90 day rx on long term meds. Pharmacy sent request to switch Paroxetine to 90 day.

## 2022-02-27 ENCOUNTER — Other Ambulatory Visit: Payer: Self-pay | Admitting: Women's Health

## 2022-02-27 ENCOUNTER — Other Ambulatory Visit (INDEPENDENT_AMBULATORY_CARE_PROVIDER_SITE_OTHER): Payer: Self-pay | Admitting: Internal Medicine

## 2022-02-27 ENCOUNTER — Telehealth (INDEPENDENT_AMBULATORY_CARE_PROVIDER_SITE_OTHER): Payer: Self-pay | Admitting: *Deleted

## 2022-02-27 ENCOUNTER — Telehealth: Payer: Self-pay | Admitting: *Deleted

## 2022-02-27 ENCOUNTER — Other Ambulatory Visit (INDEPENDENT_AMBULATORY_CARE_PROVIDER_SITE_OTHER): Payer: Self-pay | Admitting: *Deleted

## 2022-02-27 MED ORDER — PAROXETINE HCL 10 MG PO TABS: 10.0000 mg | ORAL_TABLET | Freq: Every day | ORAL | 3 refills | Status: DC

## 2022-02-27 MED ORDER — PANTOPRAZOLE SODIUM 40 MG PO TBEC: DELAYED_RELEASE_TABLET | ORAL | 0 refills | Status: DC

## 2022-02-27 NOTE — Telephone Encounter (Signed)
Insurance doesn't cover Paroxetine 7.5 capsules. They require tablets to be sent in. Will route to provider for new rx.  ?

## 2022-02-27 NOTE — Telephone Encounter (Signed)
Pt left vm that she did not know why her med was denied. I looked in chart and last visit was over 1 year ago. Left message to return call to discuss she needs office visit and can send in short supply til visit.  ?

## 2022-02-27 NOTE — Telephone Encounter (Signed)
Pt notified a refill would be sent and she needed visit.  ?

## 2022-02-27 NOTE — Telephone Encounter (Signed)
Please schedule office visit for patient.

## 2022-03-13 ENCOUNTER — Telehealth: Payer: Self-pay | Admitting: Women's Health

## 2022-03-13 NOTE — Telephone Encounter (Signed)
PT CALLING stating that Brenda [name] put her on some medicine back in Sept and states that she doesn't like how she is feeling and wants to know how she can wing herself off of it. Asking if someone will call her back.  ?

## 2022-03-13 NOTE — Telephone Encounter (Signed)
Patient states she is not liking how she feels since taking the Paroxetine for hot flashes.  She is having headaches, doesn't want to talk to people and is just not herself.  She is wanting to slowly wean herself off.  Advised to not stop medication completely but to slowly wean herself.  Advised to take every other day this week for a couple of weeks then to take one less each week.  Patient verbalized understanding with no further questions.  ?

## 2022-03-23 ENCOUNTER — Ambulatory Visit (INDEPENDENT_AMBULATORY_CARE_PROVIDER_SITE_OTHER): Payer: 59 | Admitting: Gastroenterology

## 2022-05-08 ENCOUNTER — Ambulatory Visit (INDEPENDENT_AMBULATORY_CARE_PROVIDER_SITE_OTHER): Payer: 59 | Admitting: Gastroenterology

## 2022-05-08 ENCOUNTER — Encounter (INDEPENDENT_AMBULATORY_CARE_PROVIDER_SITE_OTHER): Payer: Self-pay | Admitting: Gastroenterology

## 2022-05-08 VITALS — BP 132/83 | HR 84 | Ht 66.5 in | Wt 221.9 lb

## 2022-05-08 DIAGNOSIS — R1312 Dysphagia, oropharyngeal phase: Secondary | ICD-10-CM | POA: Diagnosis not present

## 2022-05-08 DIAGNOSIS — K219 Gastro-esophageal reflux disease without esophagitis: Secondary | ICD-10-CM | POA: Diagnosis not present

## 2022-05-08 MED ORDER — OMEPRAZOLE 40 MG PO CPDR: 40.0000 mg | DELAYED_RELEASE_CAPSULE | Freq: Every day | ORAL | 1 refills | Status: DC

## 2022-05-08 NOTE — Progress Notes (Signed)
Referring Provider: Erven Colla, DO Primary Care Physician:  Erven Colla, DO Primary GI Physician: Laural Golden  Chief Complaint  Patient presents with   Gastroesophageal Reflux    Follow up on GERD, needs refills. Concerns about being bloated all the time and wonders if she could get HIDA scan scheduled to check gallbladder.    HPI:   Brenda [name] is a 51 y.o. female with past medical history of asthma,GERD.   Patient presenting today for GERD.  History: Patient last seen feb 2021, at that time she was doing well, using apple cider vinegar gummy for constiaption with good results, as well as colon clease 2-3x/year. GERD was well controlled on protonix '40mg'$  daily with occasional breakthrough symptoms if drinking etoh. Having some liquid dysphagia with rare solid dysphagia. Patient declined EGD at last visit.   Present:  States that she has been on protonix '40mg'$  once daily. She is having 1-2 episodes of heartburn per week. She sometimes has acid regurgitation as well. States that she has to take rolaids PRN for this. She also endorses some epigastric tenderness x 1.5 weeks with some occasional chest pressure that is relieved with belching. She denies any post prandial abdominal pain. Feels that she is feeling full a little quicker than she had been. Denies any unintentional weight loss. Has some nausea on occasion, can pinpoint any precipitating or alleviating factors, usually avoids eating when she has nausea. Feels that this occurs maybe 2-3x/month, denies any vomiting. She does have some issues with mostly liquid dysphagia, 3-4x/week, states that she has some occasional issues with solids and pills, this has been ongoing for the past few years. She feels that issues are in her upper throat area versus her esophagus. She has occasional sore throat but thinks this is probably related to her allergies. Having a BM daily, she is taking 1 capful per day. Denies any issues with diarrhea.    Last Colonoscopy:03/01/20 one small polyp in cecum  Last Endoscopy:09/05/17- Granular, texture changed, vertically lined mucosa in the esophagus. Biopsied. - No endoscopic esophageal abnormality or stricture to explain patient's dysphagia. Esophagus dilated. - Z-line irregular, 37 cm from the incisors. - 2 cm hiatal hernia. - Normal stomach. - Normal duodenal bulb and second portion of the duodenum.  Recommendations:   Past Medical History:  Diagnosis Date   Asthma    GERD (gastroesophageal reflux disease)    Seasonal allergies     Past Surgical History:  Procedure Laterality Date   ABDOMINAL HYSTERECTOMY     BACK SURGERY     3-4 yrs ago.   BIOPSY  03/01/2020   Procedure: BIOPSY;  Surgeon: Rogene Houston, MD;  [facility];  Service: Endoscopy;;   BLADDER SURGERY     incontinence   BREAST REDUCTION SURGERY Bilateral 08/30/2015   Procedure: BREAST REDUCTION WITH LIPOSUCTION;  Surgeon: [name], MD;  Location: [facility];  Service: Plastics;  Laterality: Bilateral;   COLONOSCOPY N/A 03/01/2020   Procedure: COLONOSCOPY;  Surgeon: Rogene Houston, MD;  [facility];  Service: Endoscopy;  Laterality: N/A;  730   EGD/ED     5 yr ago by Dr. Laural Golden   ESOPHAGEAL DILATION N/A 09/05/2017   Procedure: ESOPHAGEAL DILATION;  Surgeon: Rogene Houston, MD;  [facility];  Service: Endoscopy;  Laterality: N/A;   ESOPHAGOGASTRODUODENOSCOPY N/A 09/05/2017   Procedure: ESOPHAGOGASTRODUODENOSCOPY (EGD);  Surgeon: Rogene Houston, MD;  [facility];  Service: Endoscopy;  Laterality: N/A;  1:00   HIGH RISK BREAST EXCISION Right 2011   REDUCTION MAMMAPLASTY Bilateral 2016    Current Outpatient Medications  Medication Sig Dispense Refill   albuterol (PROAIR HFA) 108 (90 Base) MCG/ACT inhaler Inhale 2 puffs into the lungs every 4 (four) hours as needed for wheezing or shortness of breath. 16 g 1   fluticasone (FLONASE) 50 MCG/ACT  nasal spray Place 2 sprays into both nostrils daily. 16 g 6   fluticasone (FLOVENT HFA) 44 MCG/ACT inhaler Inhale 2 puffs into the lungs 2 (two) times daily as needed (respiratory issues.). 1 each 2   loratadine (CLARITIN) 10 MG tablet Take 10 mg by mouth daily.     Multiple Vitamin (MULTIVITAMIN) tablet Take 1 tablet by mouth daily.     pantoprazole (PROTONIX) 40 MG tablet TAKE 1 TABLET BY MOUTH EVERY DAY BEFORE BREAKFAST 90 tablet 0   polyethylene glycol (MIRALAX / GLYCOLAX) 17 g packet Take 17 g by mouth daily. prn     triamcinolone cream (KENALOG) 0.1 % Apply to affected area BID 30 g 2   albuterol (PROVENTIL) (2.5 MG/3ML) 0.083% nebulizer solution Use q 4 hrs via nebulizer prn wheezing (Patient not taking: Reported on 05/08/2022) 75 mL 0   No current facility-administered medications for this visit.    Allergies as of 05/08/2022 - Review Complete 05/08/2022  Allergen Reaction Noted   Other Other (See Comments) 09/21/2015   Adhesive [tape] Rash 08/25/2015   Latex Rash 08/25/2015    Family History  Problem Relation Age of Onset   Cancer Mother 49       endometrial cancer and cervical cancer    Cancer Maternal Grandmother        prostate cancer    Social History   Socioeconomic History   Marital status: Married    Spouse name: Not on file   Number of children: Not on file   Years of education: Not on file   Highest education level: Not on file  Occupational History   Not on file  Tobacco Use   Smoking status: Never    Passive exposure: Never   Smokeless tobacco: Never  Vaping Use   Vaping Use: Never used  Substance and Sexual Activity   Alcohol use: No    Comment: rarely    Drug use: No   Sexual activity: Yes    Birth control/protection: None  Other Topics Concern   Not on file  Social History Narrative   Not on file   Social Determinants of Health   Financial Resource Strain: Low Risk    Difficulty of Paying Living Expenses: Not very hard  Food Insecurity:  Unknown   Worried About Running Out of Food in the Last Year: Patient refused   Ran Out of Food in the Last Year: Patient refused  Transportation Needs: No Transportation Needs   Lack of Transportation (Medical): No   Lack of Transportation (Non-Medical): No  Physical Activity: Insufficiently Active   Days of Exercise per Week: 2 days   Minutes of Exercise per Session: 30 min  Stress: Stress Concern Present   Feeling of Stress : Very much  Social Connections: Moderately Integrated   Frequency of Communication with Friends and Family: More than three times a week   Frequency of Social Gatherings with Friends and Family: Three times a week   Attends Religious Services: 1 to 4 times per year   Active Member of Clubs or Organizations: No   Attends Archivist Meetings:  Never   Marital Status: Married   Review of systems General: negative for malaise, night sweats, fever, chills, weight los Neck: Negative for lumps, goiter, pain and significant neck swelling Resp: Negative for cough, wheezing, dyspnea at rest CV: Negative for chest pain, leg swelling, palpitations, orthopnea GI: denies melena, hematochezia, nausea, vomiting, diarrhea, constipation, dysphagia, odyonophagia, or unintentional weight loss. +heartburn +chest pressure +early satiety MSK: Negative for joint pain or swelling, back pain, and muscle pain. Derm: Negative for itching or rash Psych: Denies depression, anxiety, memory loss, confusion. No homicidal or suicidal ideation.  Heme: Negative for prolonged bleeding, bruising easily, and swollen nodes. Endocrine: Negative for cold or heat intolerance, polyuria, polydipsia and goiter. Neuro: negative for tremor, gait imbalance, syncope and seizures. The remainder of the review of systems is noncontributory.  Physical Exam: BP 132/83 (BP Location: Right Arm, Patient Position: Sitting, Cuff Size: Large)   Pulse 84   Ht 5' 6.5" (1.689 m)   Wt 221 lb 14.4 oz (100.7 kg)    LMP 07/06/2004   BMI 35.28 kg/m  General:   Alert and oriented. No distress noted. Pleasant and cooperative.  Head:  Normocephalic and atraumatic. Eyes:  Conjuctiva clear without scleral icterus. Mouth:  Oral mucosa pink and moist. Good dentition. No lesions. Heart: Normal rate and rhythm, s1 and s2 heart sounds present.  Lungs: Clear lung sounds in all lobes. Respirations equal and unlabored. Abdomen:  +BS, soft, non-tender and non-distended. No rebound or guarding. No HSM or masses noted. Derm: No palmar erythema or jaundice Msk:  Symmetrical without gross deformities. Normal posture. Extremities:  Without edema. Neurologic:  Alert and  oriented x4 Psych:  Alert and cooperative. Normal mood and affect.  Invalid input(s): 6 MONTHS   ASSESSMENT: Brenda [name] is a 51 y.o. female presenting today for follow up of GERD.   Having 1-2 episodes of heartburn per week on protonix '40mg'$  daily, using rolaids PRN.  Having some associated epigastric tenderness, early satiety and  chest pressure that is relieved by belching, nausea occurring 2-3x/month without vomiting. Denies rectal bleeding, melena, weight loss or vomiting. She does not drink alcohol in excess and does not use NSAIDs on a regular basis. Will stop protonix and start omeprazole '40mg'$  once daily. I discussed proceeding with upper endoscopy for further evaluation of symptoms at this time, I suspect symptoms are related to uncontrolled GERD, however, ultimately cannot rule out PUD, gastritis, duodenitis.  patient prefers to trial new PPI for the next few weeks and re consider EGD at that time if symptoms are not improved. She was instructed on reflux precautions with Avoidance of greasy, spicy, fried, citrus foods, caffeine, carbonated drinks, chocolate and alcohol and Stay upright 2-3 hours after eating, prior to lying down and avoid eating late in the evenings. Patient did inquire about the possibility of her gallbladder causing these  issues, however, I did discuss with her that given her presentation I have a low suspicion this is related to her GB.   Considering dysphagia, occurs mostly with liquids and has been ongoing for the past few years, last EGD in 2018 without findings to explain dysphagia. Was previously advised to have ENT evaluation as it was suspected that swallowing was related to oropharyngeal issues versus esophageal. At this time patient prefers to hold off on any further evaluation.   PLAN:  Reflux precautions  2. Stop protonix  3. Start omeprazole '40mg'$  daily 4. Consider EGD if no improvement in symptoms in 3-4 weeks  All questions  were answered, patient verbalized understanding and is in agreement with plan as outlined above.   Virtual/phone visit in 3-4 weeks    Jaquay Posthumus L. Alver Sorrow, MSN, APRN, AGNP-C Adult-Gerontology Nurse Practitioner [facility]

## 2022-05-08 NOTE — Patient Instructions (Signed)
Please stop protonix I have sent omeprazole '40mg'$  to your pharmacy Please take this 30 minutes prior to breakfast Avoid greasy, spicy, fried, citrus foods, and be mindful that caffeine, carbonated drinks, chocolate and alcohol can increase reflux symptoms Stay upright 2-3 hours after eating, prior to lying down and avoid eating late in the evenings.  We will plan for virtual/phone visit in 3-4 weeks, if symptoms are not improved with new PPI and change in diet, we will discuss proceeding with upper endoscopy for further evaluation. Please avoid Please avoid NSAIDs at this time (advil, aleve, naproxen, goody powder, ibuprofen) as these can be very hard on your GI tract, causing inflammation, ulcers and damage to the lining of your GI tract.

## 2022-06-08 ENCOUNTER — Ambulatory Visit (INDEPENDENT_AMBULATORY_CARE_PROVIDER_SITE_OTHER): Payer: 59 | Admitting: Gastroenterology

## 2022-06-08 ENCOUNTER — Encounter (INDEPENDENT_AMBULATORY_CARE_PROVIDER_SITE_OTHER): Payer: Self-pay | Admitting: Gastroenterology

## 2022-06-08 VITALS — Ht 66.5 in | Wt 222.0 lb

## 2022-06-08 DIAGNOSIS — R110 Nausea: Secondary | ICD-10-CM

## 2022-06-08 DIAGNOSIS — R6881 Early satiety: Secondary | ICD-10-CM

## 2022-06-08 DIAGNOSIS — R1013 Epigastric pain: Secondary | ICD-10-CM | POA: Insufficient documentation

## 2022-06-08 DIAGNOSIS — K219 Gastro-esophageal reflux disease without esophagitis: Secondary | ICD-10-CM

## 2022-06-08 MED ORDER — PANTOPRAZOLE SODIUM 40 MG PO TBEC: 40.0000 mg | DELAYED_RELEASE_TABLET | Freq: Every day | ORAL | 3 refills | Status: DC

## 2022-06-08 MED ORDER — FAMOTIDINE 20 MG PO TABS: 20.0000 mg | ORAL_TABLET | Freq: Every day | ORAL | 3 refills | Status: DC

## 2022-06-08 NOTE — Progress Notes (Signed)
Primary Care Physician:  Erven Colla, DO  Primary GI: previously [facility]  Patient Location: Home   Provider Location: [facility]   Reason for Visit: follow up on GERD   Persons present on the virtual encounter, with roles: [name], MSN, APRN, AGNP-C, [name], Patient   Total time (minutes) spent on medical discussion: 10 minutes  Virtual Visit via Telephone visit is conducted virtually via telephone and was requested by patient.   I connected with Brenda [name] on 06/08/22 at  1:45 PM EDT by telephone and verified that I am speaking with the correct person using two identifiers.   I discussed the limitations, risks, security and privacy concerns of performing an evaluation and management service by telephone and the availability of in person appointments. I also discussed with the patient that there may be a patient responsible charge related to this service. The patient expressed understanding and agreed to proceed.  Chief Complaint  Patient presents with   Gastroesophageal Reflux    Patient being seen today due to a change in her Jerrye Bushy medication omeprazole. She says she took it for a week and she had issues with it making her tongue, and eyes swell. She stopped this medication and resume pantoprazole 40 mg daily.   History of Present Illness: Brenda [name] is a 51 y.o. female with past medical history of asthma,GERD.   Last seen 05/08/22, on protonix '40mg'$  once daily at that time w/1-2 episodes of heartburn per week with some acid regurgitation. Taking rolaids PRN, having some epigastric tenderness. Also with nausea 2-3x/month. Having some sore throat as well but pt felt this was related to allergies. Switched to omeprazole '40mg'$  daily at last visit with recommendations to pursue EGD if symptoms not improved.  Today, patient states that she is still having some issues with epigastric soreness. She also reports that she feels a "knot" in that area when  she eats. She is only able to eat about half of her meal as she feels full early. She did take omeprazole previously for about 1 week, had to stop due to swelling of eyes and tongue, went back to protonix '40mg'$  daily. She is having some heartburn but only with certain foods, is trying to avoid triggers. She denies dysphagia with foods getting stuck but reports that she gets "strangled" easily, this occurs with liquids as well. She denies weight loss. No vomiting, but has nausea 1-2x/month. Takes tums with relief of this. Sore throat comes and goes, but still thinks this is related to her allergies. She denies cough or hoarseness. She is taking an otc medication for nausea which seems to be helping.  Patient denies melena, hematochezia, vomiting, odynophagia,  or weight loss.   Last Colonoscopy:03/01/20 one small polyp in cecum  Last Endoscopy:09/05/17- Granular, texture changed, vertically lined mucosa in the esophagus. Biopsied. - No endoscopic esophageal abnormality or stricture to explain patient's dysphagia. Esophagus dilated. - Z-line irregular, 37 cm from the incisors. - 2 cm hiatal hernia. - Normal stomach. - Normal duodenal bulb and second portion of the duodenum.   Past Medical History:  Diagnosis Date   Asthma    GERD (gastroesophageal reflux disease)    Seasonal allergies     Past Surgical History:  Procedure Laterality Date   ABDOMINAL HYSTERECTOMY     BACK SURGERY     3-4 yrs ago.   BIOPSY  03/01/2020   Procedure: BIOPSY;  Surgeon: Rogene Houston, MD;  [facility];  Service: Endoscopy;;   BLADDER SURGERY     incontinence   BREAST REDUCTION SURGERY Bilateral 08/30/2015   Procedure: BREAST REDUCTION WITH LIPOSUCTION;  Surgeon: [name], MD;  Location: [place];  Service: Plastics;  Laterality: Bilateral;   COLONOSCOPY N/A 03/01/2020   Procedure: COLONOSCOPY;  Surgeon: Rogene Houston, MD;  [facility];  Service: Endoscopy;   Laterality: N/A;  730   EGD/ED     5 yr ago by Dr. Laural Golden   ESOPHAGEAL DILATION N/A 09/05/2017   Procedure: ESOPHAGEAL DILATION;  Surgeon: Rogene Houston, MD;  [facility];  Service: Endoscopy;  Laterality: N/A;   ESOPHAGOGASTRODUODENOSCOPY N/A 09/05/2017   Procedure: ESOPHAGOGASTRODUODENOSCOPY (EGD);  Surgeon: Rogene Houston, MD;  [facility];  Service: Endoscopy;  Laterality: N/A;  1:00   HIGH RISK BREAST EXCISION Right 2011   REDUCTION MAMMAPLASTY Bilateral 2016     Current Meds  Medication Sig   albuterol (PROAIR HFA) 108 (90 Base) MCG/ACT inhaler Inhale 2 puffs into the lungs every 4 (four) hours as needed for wheezing or shortness of breath.   albuterol (PROVENTIL) (2.5 MG/3ML) 0.083% nebulizer solution Use q 4 hrs via nebulizer prn wheezing   fluticasone (FLONASE) 50 MCG/ACT nasal spray Place 2 sprays into both nostrils daily.   fluticasone (FLOVENT HFA) 44 MCG/ACT inhaler Inhale 2 puffs into the lungs 2 (two) times daily as needed (respiratory issues.).   loratadine (CLARITIN) 10 MG tablet Take 10 mg by mouth daily.   Multiple Vitamin (MULTIVITAMIN) tablet Take 1 tablet by mouth daily.   pantoprazole (PROTONIX) 40 MG tablet Take 40 mg by mouth daily.   polyethylene glycol (MIRALAX / GLYCOLAX) 17 g packet Take 17 g by mouth daily. prn   triamcinolone cream (KENALOG) 0.1 % Apply to affected area BID     Family History  Problem Relation Age of Onset   Cancer Mother 19       endometrial cancer and cervical cancer    Cancer Maternal Grandmother        prostate cancer    Social History   Socioeconomic History   Marital status: Married    Spouse name: Not on file   Number of children: Not on file   Years of education: Not on file   Highest education level: Not on file  Occupational History   Not on file  Tobacco Use   Smoking status: Never    Passive exposure: Never   Smokeless tobacco: Never  Vaping Use   Vaping Use: Never used  Substance  and Sexual Activity   Alcohol use: Yes    Comment: rarely    Drug use: No   Sexual activity: Yes    Birth control/protection: None  Other Topics Concern   Not on file  Social History Narrative   Not on file   Social Determinants of Health   Financial Resource Strain: Low Risk  (08/31/2021)   Overall Financial Resource Strain (CARDIA)    Difficulty of Paying Living Expenses: Not very hard  Food Insecurity: Unknown (08/31/2021)   Hunger Vital Sign    Worried About Running Out of Food in the Last Year: Patient refused    [place] in the Last Year: Patient refused  Transportation Needs: No Transportation Needs (08/31/2021)   PRAPARE - Hydrologist (Medical): No    Lack of Transportation (Non-Medical): No  Physical Activity: Insufficiently Active (08/31/2021)   Exercise  Vital Sign    Days of Exercise per Week: 2 days    Minutes of Exercise per Session: 30 min  Stress: Stress Concern Present (08/31/2021)   [place]    Feeling of Stress : Very much  Social Connections: Moderately Integrated (08/31/2021)   Social Connection and Isolation Panel [NHANES]    Frequency of Communication with Friends and Family: More than three times a week    Frequency of Social Gatherings with Friends and Family: Three times a week    Attends Religious Services: 1 to 4 times per year    Active Member of Clubs or Organizations: No    Attends Archivist Meetings: Never    Marital Status: Married    Review of Systems: Gen: Denies fever, chills, anorexia. Denies fatigue, weakness, weight loss.  CV: Denies chest pain, palpitations, syncope, peripheral edema, and claudication. Resp: Denies dyspnea at rest, cough, wheezing, coughing up [name], and pleurisy. GI: see HPI Derm: Denies rash, itching, dry skin Psych: Denies depression, anxiety, memory loss, confusion. No homicidal or suicidal ideation.   Heme: Denies bruising, bleeding, and enlarged lymph nodes.  Observations/Objective: No distress. Unable to perform physical exam due to telephone encounter. No video available.   Assessment and Plan: Brenda [name] is a 51 y.o. female with past medical history of asthma,GERD.   Unable to tolerate omeprazole prescribed at last visit due to swelling of tongue and eyes. She is back on protonix '40mg'$  daily. Continues to have discomfort in epigastric region with feeling of a "knot" in that area after eating, and with occasional heartburn and nausea 1-2x/month. Notably having worsening early satiety since last OV and continued intermittent sore throat. query if sore throat is related to more silent reflux episodes. I recommend we proceed with EGD for further evaluation as I cannot rule out gastritis, duodenitis, PUD or less likely, malignancy. Will continue on protonix '40mg'$  daily for now and add famotidine '20mg'$  QHS.  Indications, risks and benefits of procedure discussed in detail with patient. Patient verbalized understanding and is in agreement to proceed with EGD at this time.   -schedule EGD, endo 1 -continue protonix '40mg'$  daily -add pepcid '20mg'$  QHS -continue with reflux precautions  -avoid all NSAIDs  Follow Up Instructions: 4 months   I discussed the assessment and treatment plan with the patient. The patient was provided an opportunity to ask questions and all were answered. The patient agreed with the plan and demonstrated an understanding of the instructions.   The patient was advised to call back or seek an in-person evaluation if the symptoms worsen or if the condition fails to improve as anticipated.  I provided 10 minutes of NON face-to-face time during this telephone encounter  Peighton Edgin L. Alver Sorrow, MSN, APRN, AGNP-C Adult-Gerontology Nurse Practitioner [facility]

## 2022-06-08 NOTE — Patient Instructions (Signed)
-  we will schedule EGD for further evaluation of your symptoms -continue protonix '40mg'$  daily -will add famotidine '20mg'$ , take this before bed  -continue with reflux precautions to include Avoiding greasy, spicy, fried, citrus foods, and be mindful that caffeine, carbonated drinks, chocolate and alcohol can increase reflux symptoms Stay upright 2-3 hours after eating, prior to lying down and avoid eating late in the evenings. -Please avoid NSAIDs (advil, aleve, naproxen, goody powder, ibuprofen) as these can be very hard on your GI tract, causing inflammation, ulcers and damage to the lining of your GI tract.    Follow up 4 months

## 2022-06-08 NOTE — H&P (View-Only) (Signed)
Primary Care Physician:  Erven Colla, DO  Primary GI: previously [facility]  Patient Location: Home   Provider Location: [facility]   Reason for Visit: follow up on GERD   Persons present on the virtual encounter, with roles: Khalin Royce L. Alver Sorrow, MSN, APRN, AGNP-C, Chaselyn M. Owens Shark, Patient   Total time (minutes) spent on medical discussion: 10 minutes  Virtual Visit via Telephone visit is conducted virtually via telephone and was requested by patient.   I connected with Brenda [name] on 06/08/22 at  1:45 PM EDT by telephone and verified that I am speaking with the correct person using two identifiers.   I discussed the limitations, risks, security and privacy concerns of performing an evaluation and management service by telephone and the availability of in person appointments. I also discussed with the patient that there may be a patient responsible charge related to this service. The patient expressed understanding and agreed to proceed.  Chief Complaint  Patient presents with   Gastroesophageal Reflux    Patient being seen today due to a change in her Jerrye Bushy medication omeprazole. She says she took it for a week and she had issues with it making her tongue, and eyes swell. She stopped this medication and resume pantoprazole 40 mg daily.   History of Present Illness: Brenda [name] is a 51 y.o. female with past medical history of asthma,GERD.   Last seen 05/08/22, on protonix '40mg'$  once daily at that time w/1-2 episodes of heartburn per week with some acid regurgitation. Taking rolaids PRN, having some epigastric tenderness. Also with nausea 2-3x/month. Having some sore throat as well but pt felt this was related to allergies. Switched to omeprazole '40mg'$  daily at last visit with recommendations to pursue EGD if symptoms not improved.  Today, patient states that she is still having some issues with epigastric soreness. She also reports that she feels a "knot" in that area when  she eats. She is only able to eat about half of her meal as she feels full early. She did take omeprazole previously for about 1 week, had to stop due to swelling of eyes and tongue, went back to protonix '40mg'$  daily. She is having some heartburn but only with certain foods, is trying to avoid triggers. She denies dysphagia with foods getting stuck but reports that she gets "strangled" easily, this occurs with liquids as well. She denies weight loss. No vomiting, but has nausea 1-2x/month. Takes tums with relief of this. Sore throat comes and goes, but still thinks this is related to her allergies. She denies cough or hoarseness. She is taking an otc medication for nausea which seems to be helping.  Patient denies melena, hematochezia, vomiting, odynophagia,  or weight loss.   Last Colonoscopy:03/01/20 one small polyp in cecum  Last Endoscopy:09/05/17- Granular, texture changed, vertically lined mucosa in the esophagus. Biopsied. - No endoscopic esophageal abnormality or stricture to explain patient's dysphagia. Esophagus dilated. - Z-line irregular, 37 cm from the incisors. - 2 cm hiatal hernia. - Normal stomach. - Normal duodenal bulb and second portion of the duodenum.   Past Medical History:  Diagnosis Date   Asthma    GERD (gastroesophageal reflux disease)    Seasonal allergies     Past Surgical History:  Procedure Laterality Date   ABDOMINAL HYSTERECTOMY     BACK SURGERY     3-4 yrs ago.   BIOPSY  03/01/2020   Procedure: BIOPSY;  Surgeon: Rogene Houston, MD;  [facility];  Service: Endoscopy;;   BLADDER SURGERY     incontinence   BREAST REDUCTION SURGERY Bilateral 08/30/2015   Procedure: BREAST REDUCTION WITH LIPOSUCTION;  Surgeon: [name], MD;  Location: [facility];  Service: Plastics;  Laterality: Bilateral;   COLONOSCOPY N/A 03/01/2020   Procedure: COLONOSCOPY;  Surgeon: Rogene Houston, MD;  [facility]: [facility];  Service: Endoscopy;   Laterality: N/A;  730   EGD/ED     5 yr ago by Dr. Laural Golden   ESOPHAGEAL DILATION N/A 09/05/2017   Procedure: ESOPHAGEAL DILATION;  Surgeon: Rogene Houston, MD;  [facility];  Service: Endoscopy;  Laterality: N/A;   ESOPHAGOGASTRODUODENOSCOPY N/A 09/05/2017   Procedure: ESOPHAGOGASTRODUODENOSCOPY (EGD);  Surgeon: Rogene Houston, MD;  [facility];  Service: Endoscopy;  Laterality: N/A;  1:00   HIGH RISK BREAST EXCISION Right 2011   REDUCTION MAMMAPLASTY Bilateral 2016     Current Meds  Medication Sig   albuterol (PROAIR HFA) 108 (90 Base) MCG/ACT inhaler Inhale 2 puffs into the lungs every 4 (four) hours as needed for wheezing or shortness of breath.   albuterol (PROVENTIL) (2.5 MG/3ML) 0.083% nebulizer solution Use q 4 hrs via nebulizer prn wheezing   fluticasone (FLONASE) 50 MCG/ACT nasal spray Place 2 sprays into both nostrils daily.   fluticasone (FLOVENT HFA) 44 MCG/ACT inhaler Inhale 2 puffs into the lungs 2 (two) times daily as needed (respiratory issues.).   loratadine (CLARITIN) 10 MG tablet Take 10 mg by mouth daily.   Multiple Vitamin (MULTIVITAMIN) tablet Take 1 tablet by mouth daily.   pantoprazole (PROTONIX) 40 MG tablet Take 40 mg by mouth daily.   polyethylene glycol (MIRALAX / GLYCOLAX) 17 g packet Take 17 g by mouth daily. prn   triamcinolone cream (KENALOG) 0.1 % Apply to affected area BID     Family History  Problem Relation Age of Onset   Cancer Mother 81       endometrial cancer and cervical cancer    Cancer Maternal Grandmother        prostate cancer    Social History   Socioeconomic History   Marital status: Married    Spouse name: Not on file   Number of children: Not on file   Years of education: Not on file   Highest education level: Not on file  Occupational History   Not on file  Tobacco Use   Smoking status: Never    Passive exposure: Never   Smokeless tobacco: Never  Vaping Use   Vaping Use: Never used  Substance  and Sexual Activity   Alcohol use: Yes    Comment: rarely    Drug use: No   Sexual activity: Yes    Birth control/protection: None  Other Topics Concern   Not on file  Social History Narrative   Not on file   Social Determinants of Health   Financial Resource Strain: Low Risk  (08/31/2021)   Overall Financial Resource Strain (CARDIA)    Difficulty of Paying Living Expenses: Not very hard  Food Insecurity: Unknown (08/31/2021)   Hunger Vital Sign    Worried About Running Out of Food in the Last Year: Patient refused    [place] in the Last Year: Patient refused  Transportation Needs: No Transportation Needs (08/31/2021)   PRAPARE - Hydrologist (Medical): No    Lack of Transportation (Non-Medical): No  Physical Activity: Insufficiently Active (08/31/2021)   Exercise  Vital Sign    Days of Exercise per Week: 2 days    Minutes of Exercise per Session: 30 min  Stress: Stress Concern Present (08/31/2021)   [place]    Feeling of Stress : Very much  Social Connections: Moderately Integrated (08/31/2021)   Social Connection and Isolation Panel [NHANES]    Frequency of Communication with Friends and Family: More than three times a week    Frequency of Social Gatherings with Friends and Family: Three times a week    Attends Religious Services: 1 to 4 times per year    Active Member of Clubs or Organizations: No    Attends Archivist Meetings: Never    Marital Status: Married    Review of Systems: Gen: Denies fever, chills, anorexia. Denies fatigue, weakness, weight loss.  CV: Denies chest pain, palpitations, syncope, peripheral edema, and claudication. Resp: Denies dyspnea at rest, cough, wheezing, coughing up [name], and pleurisy. GI: see HPI Derm: Denies rash, itching, dry skin Psych: Denies depression, anxiety, memory loss, confusion. No homicidal or suicidal ideation.   Heme: Denies bruising, bleeding, and enlarged lymph nodes.  Observations/Objective: No distress. Unable to perform physical exam due to telephone encounter. No video available.   Assessment and Plan: Brenda [name] is a 51 y.o. female with past medical history of asthma,GERD.   Unable to tolerate omeprazole prescribed at last visit due to swelling of tongue and eyes. She is back on protonix '40mg'$  daily. Continues to have discomfort in epigastric region with feeling of a "knot" in that area after eating, and with occasional heartburn and nausea 1-2x/month. Notably having worsening early satiety since last OV and continued intermittent sore throat. query if sore throat is related to more silent reflux episodes. I recommend we proceed with EGD for further evaluation as I cannot rule out gastritis, duodenitis, PUD or less likely, malignancy. Will continue on protonix '40mg'$  daily for now and add famotidine '20mg'$  QHS.  Indications, risks and benefits of procedure discussed in detail with patient. Patient verbalized understanding and is in agreement to proceed with EGD at this time.   -schedule EGD, endo 1 -continue protonix '40mg'$  daily -add pepcid '20mg'$  QHS -continue with reflux precautions  -avoid all NSAIDs  Follow Up Instructions: 4 months   I discussed the assessment and treatment plan with the patient. The patient was provided an opportunity to ask questions and all were answered. The patient agreed with the plan and demonstrated an understanding of the instructions.   The patient was advised to call back or seek an in-person evaluation if the symptoms worsen or if the condition fails to improve as anticipated.  I provided 10 minutes of NON face-to-face time during this telephone encounter  Giuliano Preece L. Alver Sorrow, MSN, APRN, AGNP-C Adult-Gerontology Nurse Practitioner [facility]

## 2022-06-12 ENCOUNTER — Telehealth (INDEPENDENT_AMBULATORY_CARE_PROVIDER_SITE_OTHER): Payer: Self-pay

## 2022-06-12 ENCOUNTER — Encounter (INDEPENDENT_AMBULATORY_CARE_PROVIDER_SITE_OTHER): Payer: Self-pay

## 2022-06-12 ENCOUNTER — Other Ambulatory Visit (INDEPENDENT_AMBULATORY_CARE_PROVIDER_SITE_OTHER): Payer: Self-pay

## 2022-06-12 DIAGNOSIS — R1013 Epigastric pain: Secondary | ICD-10-CM

## 2022-06-12 DIAGNOSIS — R6881 Early satiety: Secondary | ICD-10-CM

## 2022-06-12 NOTE — Telephone Encounter (Signed)
Tesneem Dufrane Ann Kesley Gaffey, CMA  ?

## 2022-06-26 ENCOUNTER — Encounter (HOSPITAL_COMMUNITY)
Admission: RE | Admit: 2022-06-26 | Discharge: 2022-06-26 | Disposition: A | Discharge status: Home / Self Care | Payer: 59 | Source: Ambulatory Visit | Attending: Gastroenterology | Admitting: Gastroenterology

## 2022-06-29 ENCOUNTER — Encounter (HOSPITAL_COMMUNITY): Payer: Self-pay | Admitting: Gastroenterology

## 2022-06-29 ENCOUNTER — Ambulatory Visit (HOSPITAL_BASED_OUTPATIENT_CLINIC_OR_DEPARTMENT_OTHER): Payer: 59 | Admitting: Anesthesiology

## 2022-06-29 ENCOUNTER — Ambulatory Visit (HOSPITAL_COMMUNITY)
Admission: RE | Admit: 2022-06-29 | Discharge: 2022-06-29 | Disposition: A | Discharge status: Home / Self Care | Payer: 59 | Attending: Gastroenterology | Admitting: Gastroenterology

## 2022-06-29 ENCOUNTER — Encounter (HOSPITAL_COMMUNITY)
Admission: RE | Disposition: A | Discharge status: Home / Self Care | Payer: Self-pay | Source: Home / Self Care | Attending: Gastroenterology

## 2022-06-29 ENCOUNTER — Ambulatory Visit (HOSPITAL_COMMUNITY): Payer: 59 | Admitting: Anesthesiology

## 2022-06-29 DIAGNOSIS — K219 Gastro-esophageal reflux disease without esophagitis: Secondary | ICD-10-CM

## 2022-06-29 DIAGNOSIS — J449 Chronic obstructive pulmonary disease, unspecified: Secondary | ICD-10-CM | POA: Insufficient documentation

## 2022-06-29 DIAGNOSIS — R1030 Lower abdominal pain, unspecified: Secondary | ICD-10-CM

## 2022-06-29 DIAGNOSIS — K2950 Unspecified chronic gastritis without bleeding: Secondary | ICD-10-CM | POA: Insufficient documentation

## 2022-06-29 DIAGNOSIS — K2970 Gastritis, unspecified, without bleeding: Secondary | ICD-10-CM | POA: Diagnosis not present

## 2022-06-29 DIAGNOSIS — K449 Diaphragmatic hernia without obstruction or gangrene: Secondary | ICD-10-CM

## 2022-06-29 DIAGNOSIS — R1013 Epigastric pain: Secondary | ICD-10-CM | POA: Diagnosis present

## 2022-06-29 DIAGNOSIS — K31A0 Gastric intestinal metaplasia, unspecified: Secondary | ICD-10-CM | POA: Diagnosis not present

## 2022-06-29 DIAGNOSIS — R6881 Early satiety: Secondary | ICD-10-CM

## 2022-06-29 DIAGNOSIS — Z79899 Other long term (current) drug therapy: Secondary | ICD-10-CM | POA: Insufficient documentation

## 2022-06-29 HISTORY — PX: BIOPSY: SHX5522

## 2022-06-29 HISTORY — PX: ESOPHAGOGASTRODUODENOSCOPY (EGD) WITH PROPOFOL: SHX5813

## 2022-06-29 SURGERY — ESOPHAGOGASTRODUODENOSCOPY (EGD) WITH PROPOFOL
Anesthesia: General

## 2022-06-29 MED ORDER — LIDOCAINE HCL (CARDIAC) PF 100 MG/5ML IV SOSY
PREFILLED_SYRINGE | INTRAVENOUS | Status: DC | PRN
  Administered 2022-06-29: 50 mg via INTRAVENOUS

## 2022-06-29 MED ORDER — PANTOPRAZOLE SODIUM 40 MG PO TBEC: 40.0000 mg | DELAYED_RELEASE_TABLET | Freq: Two times a day (BID) | ORAL | 3 refills | Status: DC

## 2022-06-29 MED ORDER — PROPOFOL 500 MG/50ML IV EMUL
INTRAVENOUS | Status: DC | PRN
  Administered 2022-06-29: 150 ug/kg/min via INTRAVENOUS

## 2022-06-29 MED ORDER — PROPOFOL 10 MG/ML IV BOLUS
INTRAVENOUS | Status: DC | PRN
  Administered 2022-06-29: 100 mg via INTRAVENOUS
  Administered 2022-06-29 (×2): 50 mg via INTRAVENOUS

## 2022-06-29 MED ORDER — LACTATED RINGERS IV SOLN
INTRAVENOUS | Status: DC
Start: 2022-06-29 — End: 2022-06-29

## 2022-06-29 NOTE — Anesthesia Preprocedure Evaluation (Signed)
Anesthesia Evaluation  Patient identified by MRN, date of birth, ID band Patient awake    Reviewed: Allergy & Precautions, H&P , NPO status , Patient's Chart, lab work & pertinent test results, reviewed documented beta blocker date and time   Airway Mallampati: II  TM Distance: >3 FB Neck ROM: full    Dental no notable dental hx.    Pulmonary asthma , COPD,    Pulmonary exam normal breath sounds clear to auscultation       Cardiovascular Exercise Tolerance: Good negative cardio ROS   Rhythm:regular Rate:Normal     Neuro/Psych PSYCHIATRIC DISORDERS Anxiety Depression negative neurological ROS     GI/Hepatic Neg liver ROS, GERD  Medicated,  Endo/Other  negative endocrine ROS  Renal/GU negative Renal ROS  negative genitourinary   Musculoskeletal   Abdominal   Peds  Hematology negative hematology ROS (+)   Anesthesia Other Findings   Reproductive/Obstetrics negative OB ROS                             Anesthesia Physical Anesthesia Plan  ASA: 2  Anesthesia Plan: General   Post-op Pain Management:    Induction:   PONV Risk Score and Plan: Propofol infusion  Airway Management Planned:   Additional Equipment:   Intra-op Plan:   Post-operative Plan:   Informed Consent: I have reviewed the patients History and Physical, chart, labs and discussed the procedure including the risks, benefits and alternatives for the proposed anesthesia with the patient or authorized representative who has indicated his/her understanding and acceptance.     Dental Advisory Given  Plan Discussed with: CRNA  Anesthesia Plan Comments:         Anesthesia Quick Evaluation

## 2022-06-29 NOTE — Transfer of Care (Signed)
Immediate Anesthesia Transfer of Care Note  Patient: Brenda [name]  Procedure(s) Performed: ESOPHAGOGASTRODUODENOSCOPY (EGD) WITH PROPOFOL BIOPSY  Patient Location: Short Stay  Anesthesia Type:General  Level of Consciousness: awake  Airway & Oxygen Therapy: Patient Spontanous Breathing  Post-op Assessment: Report given to RN and Post -op Vital signs reviewed and stable  Post vital signs: Reviewed and stable  Last Vitals:  Vitals Value Taken Time  BP 97/51 06/29/22 1344  Temp 36.4 C 06/29/22 1344  Pulse 78 06/29/22 1344  Resp 16 06/29/22 1344  SpO2 97 % 06/29/22 1344    Last Pain:  Vitals:   06/29/22 1344  TempSrc: Oral  PainSc: 0-No pain         Complications: No notable events documented.

## 2022-06-29 NOTE — Op Note (Signed)
[facility] Patient Name: Brenda [name] Procedure Date: 06/29/2022 1:11 PM MRN: [id number] Date of Birth: 12/09/1970 Attending MD: Maylon Peppers ,  CSN: [id number] Age: 51 Admit Type: Outpatient Procedure:                Upper GI endoscopy Indications:              Epigastric abdominal pain, Follow-up of                            gastro-esophageal reflux disease Providers:                Maylon Peppers, Caprice Kluver, Thomas Hoff.,                            Technician Referring MD:              Medicines:                Monitored Anesthesia Care Complications:            No immediate complications. Estimated Blood Loss:     Estimated blood loss: none. Procedure:                Pre-Anesthesia Assessment:                           - Prior to the procedure, a History and Physical                            was performed, and patient medications, allergies                            and sensitivities were reviewed. The patient's                            tolerance of previous anesthesia was reviewed.                           - The risks and benefits of the procedure and the                            sedation options and risks were discussed with the                            patient. All questions were answered and informed                            consent was obtained.                           - ASA Grade Assessment: II - A patient with mild                            systemic disease.                           After obtaining informed consent, the endoscope was  passed under direct vision. Throughout the                            procedure, the patient's blood pressure, pulse, and                            oxygen saturations were monitored continuously. The                            GIF-H190 ([id number]) scope was introduced through the                            mouth, and advanced to the second part of duodenum.                             The upper GI endoscopy was accomplished without                            difficulty. The patient tolerated the procedure                            well. Scope In: 1:32:36 PM Scope Out: 1:39:24 PM Total Procedure Duration: 0 hours 6 minutes 48 seconds  Findings:      A 2 cm hiatal hernia was present.      The gastroesophageal flap valve was visualized endoscopically and       classified as Hill Grade IV (no fold, wide open lumen, hiatal hernia       present).      The entire examined stomach was normal. Biopsies were taken with a cold       forceps for Helicobacter pylori testing.      The in the duodenum was normal. Impression:               - 2 cm hiatal hernia.                           - Gastroesophageal flap valve classified as Hill                            Grade IV (no fold, wide open lumen, hiatal hernia                            present).                           - Normal stomach. Biopsied.                           - Normal. Moderate Sedation:      Per Anesthesia Care Recommendation:           - Discharge patient to home (ambulatory).                           - Resume previous diet.                           -  Await pathology results.                           - Use Protonix (pantoprazole) 40 mg PO BID.                           - Stop Pepcid Procedure Code(s):        --- Professional ---                           [id number], Esophagogastroduodenoscopy, flexible,                            transoral; with biopsy, single or multiple Diagnosis Code(s):        --- Professional ---                           K44.9, Diaphragmatic hernia without obstruction or                            gangrene                           R10.13, Epigastric pain                           K21.9, Gastro-esophageal reflux disease without                            esophagitis CPT copyright 2019 American Medical Association. All rights reserved. The codes documented in this report are  preliminary and upon coder review may  be revised to meet current compliance requirements. Maylon Peppers, MD Maylon Peppers,  06/29/2022 1:53:07 PM This report has been signed electronically. Number of Addenda: 0

## 2022-06-29 NOTE — Anesthesia Procedure Notes (Signed)
Date/Time: 06/29/2022 1:25 PM  Performed by: Orlie Dakin, CRNAPre-anesthesia Checklist: Patient identified, Emergency Drugs available, Suction available and Patient being monitored Patient Re-evaluated:Patient Re-evaluated prior to induction Oxygen Delivery Method: Nasal cannula Induction Type: IV induction Placement Confirmation: positive ETCO2

## 2022-06-29 NOTE — Interval H&P Note (Signed)
History and Physical Interval Note:  06/29/2022 12:17 PM  Brenda [name]  has presented today for surgery, with the diagnosis of Early Satiety epigastric Pain.  The various methods of treatment have been discussed with the patient and family. After consideration of risks, benefits and other options for treatment, the patient has consented to  Procedure(s) with comments: ESOPHAGOGASTRODUODENOSCOPY (EGD) WITH PROPOFOL (N/A) - 205 ASA 1 as a surgical intervention.  The patient's history has been reviewed, patient examined, no change in status, stable for surgery.  I have reviewed the patient's chart and labs.  Questions were answered to the patient's satisfaction.     Maylon Peppers Mayorga

## 2022-06-29 NOTE — Discharge Instructions (Signed)
You are being discharged to home.  Resume your previous diet.  We are waiting for your pathology results.  Take Protonix (pantoprazole) 40 mg by mouth twice a day.  Stop Pepcid.

## 2022-06-30 NOTE — Anesthesia Postprocedure Evaluation (Signed)
Anesthesia Post Note  Patient: Brenda [name]  Procedure(s) Performed: ESOPHAGOGASTRODUODENOSCOPY (EGD) WITH PROPOFOL BIOPSY  Patient location during evaluation: Phase II Anesthesia Type: General Level of consciousness: awake Pain management: pain level controlled Vital Signs Assessment: post-procedure vital signs reviewed and stable Respiratory status: spontaneous breathing and respiratory function stable Cardiovascular status: blood pressure returned to baseline and stable Postop Assessment: no headache and no apparent nausea or vomiting Anesthetic complications: no Comments: Late entry   No notable events documented.   Last Vitals:  Vitals:   06/29/22 1344 06/29/22 1345  BP: (!) 97/51 (!) 100/55  Pulse: 78 79  Resp: 16 18  Temp: (!) 36.4 C   SpO2: 97% 97%    Last Pain:  Vitals:   06/29/22 1344  TempSrc: Oral  PainSc: 0-No pain                 Louann Sjogren

## 2022-07-03 LAB — SURGICAL PATHOLOGY

## 2022-07-04 ENCOUNTER — Encounter (HOSPITAL_COMMUNITY): Payer: Self-pay | Admitting: Gastroenterology

## 2022-10-09 ENCOUNTER — Telehealth (INDEPENDENT_AMBULATORY_CARE_PROVIDER_SITE_OTHER): Payer: 59 | Admitting: Gastroenterology

## 2022-10-14 ENCOUNTER — Encounter (INDEPENDENT_AMBULATORY_CARE_PROVIDER_SITE_OTHER): Payer: Self-pay | Admitting: Gastroenterology

## 2022-11-17 ENCOUNTER — Other Ambulatory Visit (INDEPENDENT_AMBULATORY_CARE_PROVIDER_SITE_OTHER): Payer: Self-pay | Admitting: Gastroenterology

## 2023-02-07 ENCOUNTER — Other Ambulatory Visit (INDEPENDENT_AMBULATORY_CARE_PROVIDER_SITE_OTHER): Payer: Self-pay | Admitting: Gastroenterology

## 2023-02-07 ENCOUNTER — Telehealth (INDEPENDENT_AMBULATORY_CARE_PROVIDER_SITE_OTHER): Payer: Self-pay

## 2023-02-07 NOTE — Telephone Encounter (Signed)
     pantoprazole (PROTONIX) 40 MG tablet Please call patient to set up an appointment.Thanks       Sig: TAKE 1 TABLET(40 MG) BY MOUTH DAILY   Disp: 90 tablet    Refills: 0 (Pharmacy requested: Not specified)   Start: 02/07/2023   Class: Normal   Authorized by: Gabriel Rung, NP      To be filled at: [place], [place]. [address]         to Me     02/07/23  3:47 PM Needs follow up, she does not have any upcoming appts. Will send refill

## 2023-02-07 NOTE — Telephone Encounter (Signed)
Message sent to Mitzie to schedule patient an appointment.

## 2023-05-31 ENCOUNTER — Encounter (INDEPENDENT_AMBULATORY_CARE_PROVIDER_SITE_OTHER): Payer: Self-pay

## 2023-05-31 ENCOUNTER — Telehealth (INDEPENDENT_AMBULATORY_CARE_PROVIDER_SITE_OTHER): Payer: 59 | Admitting: Gastroenterology

## 2023-05-31 DIAGNOSIS — K219 Gastro-esophageal reflux disease without esophagitis: Secondary | ICD-10-CM

## 2023-05-31 DIAGNOSIS — R6881 Early satiety: Secondary | ICD-10-CM | POA: Diagnosis not present

## 2023-05-31 DIAGNOSIS — K5900 Constipation, unspecified: Secondary | ICD-10-CM | POA: Diagnosis not present

## 2023-05-31 MED ORDER — PANTOPRAZOLE SODIUM 40 MG PO TBEC: 40.0000 mg | DELAYED_RELEASE_TABLET | Freq: Every day | ORAL | 3 refills | Status: DC

## 2023-05-31 NOTE — Patient Instructions (Addendum)
-  We will get you scheduled for gastric emptying study  -continue Protonix 40mg  daily  -Increase water intake, aim for atleast 64 oz per day Increase fruits, veggies and whole grains, kiwi and prunes are especially good for constipation  -Start taking Miralax 1 capful every day for one week. If bowel movements do not improve, increase to 1 capful every 12 hours. If after two weeks there is no improvement, increase to 1 capful every 8 hours  Follow up 6 months  It was a pleasure to see you today. I want to create trusting relationships with patients and provide genuine, compassionate, and quality care. I truly value your feedback! please be on the lookout for a survey regarding your visit with me today. I appreciate your input about our visit and your time in completing this!    Marshelle Bilger L. Jeanmarie Hubert, MSN, APRN, AGNP-C Adult-Gerontology Nurse Practitioner [facility]

## 2023-05-31 NOTE — Progress Notes (Addendum)
Primary Care Physician:  Annalee Genta, DO  Primary GI: Levon Hedger   Patient Location: Home   Provider Location: [place] [facility]   Reason for Visit: follow up of GERD, early satiety and also with constipation    Persons present on the virtual encounter, with roles: Brenda Rufo L. Jeanmarie Hubert, Brenda [name], Brenda [name], Brenda [name], Brenda [name]-patient    Total time (minutes) spent on medical discussion: 8 minutes  Virtual Visit via MyChart Video Note visit is conducted virtually and was requested by patient.   I connected with Brenda [name] on 05/31/23 at 12:15 PM EDT by Mychart Video and verified that I am speaking with the correct person using two identifiers.   I discussed the limitations, risks, security and privacy concerns of performing an evaluation and management service by telephone and the availability of in person appointments. I also discussed with the patient that there may be a patient responsible charge related to this service. The patient expressed understanding and agreed to proceed.  Chief Complaint  Patient presents with   Gastroesophageal Reflux    Follow up on GERD. Takes protonix daily and has changed diet.    History of Present Illness: Brenda [name] is a 52 y.o. female with past medical history of asthma,GERD.    Last seen July 2023, at that time, having epigastric soreness, feeling a knot in the area when eating. Early satiety. Did not tolerate omeprazole due to swelling, back on protonix 40mg  daily. Nausea a few times per month.   Recommended to schedule EGD, continue protonix 40mg  daily, add famotidine 20mg  at bedtime  Present: She notes GERD is well controlled with protonix 40mg  daily. She states that she is still having some early satiety.  She denies nausea or vomiting. She had been trying to cut calories and eat healthier with [employer] which is a company that sends healthy meals though recently had to stop using this due to cost. She had lost some weight but has  gained a bit back. She weighs approximately 214 lbs currently.  She is still thinking about the TIF procedure as previously discussed by Dr. Levon Hedger, she will let us know if she decides to pursue this  Having some issues with constipation. Notes she is having to strain a lot but having a BM but still going daily. Some blood noted on toilet tissue in times of straining. She is drinking 3-4 large water bottles per day. Thinks she needs to start her miralax back   Last Colonoscopy:03/01/20 one small polyp in cecum  Last EGD 06/2022:- 2 cm hiatal hernia. - Gastroesophageal flap valve classified as Hill Grade IV (no fold, wide open lumen, hiatal hernia  present). - Normal stomach. Biopsied.Mild chronic active gastritis with focal intestinal metaplasia. Immunohistochemistry for Helicobacter pylori is negative.  Alcian blue stain highlights intestinal metaplasia.  - Normal. Repeat EGD 3 years for gastric mapping   Past Medical History:  Diagnosis Date   Asthma    GERD (gastroesophageal reflux disease)    Seasonal allergies      Past Surgical History:  Procedure Laterality Date   ABDOMINAL HYSTERECTOMY     BACK SURGERY     3-4 yrs ago.   BIOPSY  03/01/2020   Procedure: BIOPSY;  Surgeon: Malissa Hippo, MD;  [facility];  Service: Endoscopy;;   BIOPSY  06/29/2022   Procedure: BIOPSY;  Surgeon: Dolores Frame, MD;  [facility];  Service: Gastroenterology;;   BLADDER SURGERY  incontinence   BREAST REDUCTION SURGERY Bilateral 08/30/2015   Procedure: BREAST REDUCTION WITH LIPOSUCTION;  Surgeon: Louisa Second, MD;  Location: [facility];  Service: Plastics;  Laterality: Bilateral;   COLONOSCOPY N/A 03/01/2020   Procedure: COLONOSCOPY;  Surgeon: Malissa Hippo, MD;  [facility];  Service: Endoscopy;  Laterality: N/A;  730   EGD/ED     5 yr ago by Dr. Karilyn Cota   ESOPHAGEAL DILATION N/A 09/05/2017   Procedure: ESOPHAGEAL  DILATION;  Surgeon: Malissa Hippo, MD;  [facility];  Service: Endoscopy;  Laterality: N/A;   ESOPHAGOGASTRODUODENOSCOPY N/A 09/05/2017   Procedure: ESOPHAGOGASTRODUODENOSCOPY (EGD);  Surgeon: Malissa Hippo, MD;  [facility];  Service: Endoscopy;  Laterality: N/A;  1:00   ESOPHAGOGASTRODUODENOSCOPY (EGD) WITH PROPOFOL N/A 06/29/2022   Procedure: ESOPHAGOGASTRODUODENOSCOPY (EGD) WITH PROPOFOL;  Surgeon: Dolores Frame, MD;  [facility];  Service: Gastroenterology;  Laterality: N/A;  205 ASA 1   HIGH RISK BREAST EXCISION Right 2011   REDUCTION MAMMAPLASTY Bilateral 2016     Current Meds  Medication Sig   albuterol (PROAIR HFA) 108 (90 Base) MCG/ACT inhaler Inhale 2 puffs into the lungs every 4 (four) hours as needed for wheezing or shortness of breath.   albuterol (PROVENTIL) (2.5 MG/3ML) 0.083% nebulizer solution Use q 4 hrs via nebulizer prn wheezing   fluticasone (FLONASE) 50 MCG/ACT nasal spray Place 2 sprays into both nostrils daily. (Patient taking differently: Place 2 sprays into both nostrils daily as needed for allergies.)   fluticasone (FLOVENT HFA) 44 MCG/ACT inhaler Inhale 2 puffs into the lungs 2 (two) times daily as needed (respiratory issues.).   ibuprofen (ADVIL) 200 MG tablet Take 400 mg by mouth every 6 (six) hours as needed for moderate pain.   loratadine-pseudoephedrine (CLARITIN-D 24-HOUR) 10-240 MG 24 hr tablet Take 1 tablet by mouth daily.   Multiple Vitamin (MULTIVITAMIN) tablet Take 1 tablet by mouth daily.   pantoprazole (PROTONIX) 40 MG tablet TAKE 1 TABLET(40 MG) BY MOUTH DAILY   triamcinolone cream (KENALOG) 0.1 % Apply to affected area BID     Family History  Problem Relation Age of Onset   Cancer Mother 50       endometrial cancer and cervical cancer    Cancer Maternal Grandmother        prostate cancer    Social History   Socioeconomic History   Marital status: Married    Spouse name: Not on file    Number of children: Not on file   Years of education: Not on file   Highest education level: Not on file  Occupational History   Not on file  Tobacco Use   Smoking status: Never    Passive exposure: Never   Smokeless tobacco: Never  Vaping Use   Vaping Use: Never used  Substance and Sexual Activity   Alcohol use: Yes    Comment: rarely    Drug use: No   Sexual activity: Yes    Birth control/protection: None  Other Topics Concern   Not on file  Social History Narrative   Not on file   Social Determinants of Health   Financial Resource Strain: Low Risk  (08/31/2021)   Overall Financial Resource Strain (CARDIA)    Difficulty of Paying Living Expenses: Not very hard  Food Insecurity: Unknown (08/31/2021)   Hunger Vital Sign    Worried About Running Out of Food in the Last Year: Patient declined    Ran Out  of Food in the Last Year: Patient declined  Transportation Needs: No Transportation Needs (08/31/2021)   PRAPARE - Administrator, Civil Service (Medical): No    Lack of Transportation (Non-Medical): No  Physical Activity: Insufficiently Active (08/31/2021)   Exercise Vital Sign    Days of Exercise per Week: 2 days    Minutes of Exercise per Session: 30 min  Stress: Stress Concern Present (08/31/2021)   [employer] of Occupational Health - Occupational Stress Questionnaire    Feeling of Stress : Very much  Social Connections: Moderately Integrated (08/31/2021)   Social Connection and Isolation Panel [NHANES]    Frequency of Communication with Friends and Family: More than three times a week    Frequency of Social Gatherings with Friends and Family: Three times a week    Attends Religious Services: 1 to 4 times per year    Active Member of Clubs or Organizations: No    Attends Banker Meetings: Never    Marital Status: Married   Review of Systems: Gen: Denies fever, chills, anorexia. Denies fatigue, weakness, weight loss.  CV: Denies chest  pain, palpitations, syncope, peripheral edema, and claudication. Resp: Denies dyspnea at rest, cough, wheezing, coughing up blood, and pleurisy. GI: see HPI Derm: Denies rash, itching, dry skin Psych: Denies depression, anxiety, memory loss, confusion. No homicidal or suicidal ideation.  Heme: Denies bruising, bleeding, and enlarged lymph nodes.  Observations/Objective: No distress. Unable to perform physical exam due to telephone encounter. No video available.   Assessment and Plan: TAKYRA CANTRALL is a 52 y.o. female with past medical history of asthma,GERD who presents virtually for follow up of GERD/early satiety and with constipation  GERD/early satiety: well controlled on protonix 40mg  once daily. Recent EGD as above. She continues to have early satiety. No nausea or vomiting. Recommend proceeding with GES for further evaluation of her symptoms, will continue with protonix 40mg  daily, good reflux precautions  Constipation/toilet tissue hematochezia: reports a lot of straining with defecation, some occasional toilet tissue hematochezia during times of more straining, has not been taking her miralax, water intake is appropriate. Encouraged to Start taking Miralax 1 capful every day for one week. If bowel movements do not improve, increase to 1 capful every 12 hours. If after two weeks there is no improvement, increase to 1 capful every 8 hours, aim for atleast 64 oz of water per day, Increase fruits, veggies and whole grains, kiwi and prunes are especially good for constipation. She had colonoscopy in 2021, suspect rectal bleeding possibly secondary to hemorrhoids in setting of straining, however, if this worsens or does not improve with treatment of constipation, she should let me know. Should try to Avoid straining   -schedule GES  -continue Protonix 40mg  daily  -Increase water intake, aim for atleast 64 oz per day Increase fruits, veggies and whole grains, kiwi and prunes are especially  good for constipation Start taking Miralax 1 capful every day for one week. If bowel movements do not improve, increase to 1 capful every 12 hours. If after two weeks there is no improvement, increase to 1 capful every 8 hours -good reflux precautions -avoid straining, pt to make me aware if rectal bleeding fails to improve with bowel regimen  Follow Up Instructions: 6 months   I discussed the assessment and treatment plan with the patient. The patient was provided an opportunity to ask questions and all were answered. The patient agreed with the plan and demonstrated an understanding of  the instructions.   The patient was advised to call back or seek an in-person evaluation if the symptoms worsen or if the condition fails to improve as anticipated.  I provided 8 minutes of face-to-face time during this MyChart Video encounter.  Dahmir Epperly L. Jeanmarie Hubert, Brenda [name], Brenda [name], Brenda [name] Adult-Gerontology Nurse Practitioner [facility]  I have reviewed the note and agree with the APP's assessment as described in this progress note  Katrinka Blazing, MD Gastroenterology and Hepatology [facility]

## 2023-06-05 ENCOUNTER — Encounter: Payer: Self-pay | Admitting: Emergency Medicine

## 2023-06-05 ENCOUNTER — Ambulatory Visit
Admission: EM | Admit: 2023-06-05 | Discharge: 2023-06-05 | Disposition: A | Discharge status: Home / Self Care | Payer: 59 | Attending: Nurse Practitioner | Admitting: Nurse Practitioner

## 2023-06-05 DIAGNOSIS — S76312A Strain of muscle, fascia and tendon of the posterior muscle group at thigh level, left thigh, initial encounter: Secondary | ICD-10-CM | POA: Diagnosis not present

## 2023-06-05 MED ORDER — TIZANIDINE HCL 4 MG PO TABS: 4.0000 mg | ORAL_TABLET | Freq: Every evening | ORAL | 0 refills | Status: DC | PRN

## 2023-06-05 MED ORDER — KETOROLAC TROMETHAMINE 30 MG/ML IJ SOLN
30.0000 mg | Freq: Once | INTRAMUSCULAR | Status: AC
  Administered 2023-06-05: 30 mg via INTRAMUSCULAR

## 2023-06-05 NOTE — ED Provider Notes (Signed)
[facility]    CSN: [id number] Arrival date & time: 06/05/23  1408      History   Chief Complaint No chief complaint on file.   HPI Brenda [name] is a 52 y.o. female.   Patient presents today with left lower extremity pain that is behind her thigh and moves up to her hip and down to her knee for the past 3 weeks.  She denies recent accident, fall, trauma, or injury involving the left hamstring.  No swelling or bruising.  No redness or fevers, nausea/vomiting since the pain began.  She denies weakness to the point of falling, decreased sensation of the lower extremities, saddle anesthesia, new bowel or bladder incontinence, dysuria/urinary frequency, urinary urgency, and hematuria since the pain began.  Has been taking ibuprofen and Advil for the pain with minimal improvement.    Past Medical History:  Diagnosis Date   Asthma    GERD (gastroesophageal reflux disease)    Seasonal allergies     Patient Active Problem List   Diagnosis Date Noted   Abdominal pain, epigastric 06/08/2022   Nausea without vomiting 06/08/2022   Early satiety 06/08/2022   Oropharyngeal dysphagia 05/08/2022   Hx of hysterectomy 09/02/2021   Bronchitis 12/22/2020   Asthma due to seasonal allergies 12/22/2020   Esophageal dysphagia 05/29/2017   Gastroesophageal reflux disease without esophagitis 05/29/2017   Atypical ductal hyperplasia of right breast 11/04/2015   Hot flushes, perimenopausal 11/04/2015   Morbid obesity (HCC) 04/30/2015   Chronic upper back pain 04/30/2015   Gastroesophageal reflux disease 05/14/2014   Asthma, chronic obstructive, with acute exacerbation (HCC) 05/08/2014   Anxiety and depression 04/08/2014   Allergic rhinitis 04/01/2014   Reactive airways dysfunction syndrome (HCC) 04/01/2014   Rhinitis medicamentosa 10/14/2013   BACK PAIN 05/12/2009   SPONDYLOLYSIS 05/12/2009   SPONDYLOLITHESIS 05/12/2009    Past Surgical History:  Procedure Laterality Date    ABDOMINAL HYSTERECTOMY     BACK SURGERY     3-4 yrs ago.   BIOPSY  03/01/2020   Procedure: BIOPSY;  Surgeon: Malissa Hippo, MD;  [facility];  Service: Endoscopy;;   BIOPSY  06/29/2022   Procedure: BIOPSY;  Surgeon: Dolores Frame, MD;  [facility];  Service: Gastroenterology;;   BLADDER SURGERY     incontinence   BREAST REDUCTION SURGERY Bilateral 08/30/2015   Procedure: BREAST REDUCTION WITH LIPOSUCTION;  Surgeon: Louisa Second, MD;  Location: [facility];  Service: Plastics;  Laterality: Bilateral;   COLONOSCOPY N/A 03/01/2020   Procedure: COLONOSCOPY;  Surgeon: Malissa Hippo, MD;  [facility];  Service: Endoscopy;  Laterality: N/A;  730   EGD/ED     5 yr ago by Dr. Karilyn Cota   ESOPHAGEAL DILATION N/A 09/05/2017   Procedure: ESOPHAGEAL DILATION;  Surgeon: Malissa Hippo, MD;  [facility];  Service: Endoscopy;  Laterality: N/A;   ESOPHAGOGASTRODUODENOSCOPY N/A 09/05/2017   Procedure: ESOPHAGOGASTRODUODENOSCOPY (EGD);  Surgeon: Malissa Hippo, MD;  [facility];  Service: Endoscopy;  Laterality: N/A;  1:00   ESOPHAGOGASTRODUODENOSCOPY (EGD) WITH PROPOFOL N/A 06/29/2022   Procedure: ESOPHAGOGASTRODUODENOSCOPY (EGD) WITH PROPOFOL;  Surgeon: Dolores Frame, MD;  [facility];  Service: Gastroenterology;  Laterality: N/A;  205 ASA 1   HIGH RISK BREAST EXCISION Right 2011   REDUCTION MAMMAPLASTY Bilateral 2016    OB History     Gravida  3   Para  2   Term  2  Preterm      AB  1   Living  2      SAB      IAB      Ectopic      Multiple      Live Births               Home Medications    Prior to Admission medications   Medication Sig Start Date End Date Taking? Authorizing Provider  tiZANidine (ZANAFLEX) 4 MG tablet Take 1 tablet (4 mg total) by mouth at bedtime as needed for muscle spasms. Do not take with alcohol or while driving or operating heavy  machinery.  May cause drowsiness. 06/05/23  Yes Valentino Nose, NP  albuterol (PROAIR HFA) 108 (90 Base) MCG/ACT inhaler Inhale 2 puffs into the lungs every 4 (four) hours as needed for wheezing or shortness of breath. 12/22/20   Novella Olive, FNP  albuterol (PROVENTIL) (2.5 MG/3ML) 0.083% nebulizer solution Use q 4 hrs via nebulizer prn wheezing 11/22/20   Wallis Bamberg, PA-C  fluticasone [facility]) 50 MCG/ACT nasal spray Place 2 sprays into both nostrils daily. Patient taking differently: Place 2 sprays into both nostrils daily as needed for allergies. 12/22/20   Novella Olive, FNP  fluticasone (FLOVENT HFA) 44 MCG/ACT inhaler Inhale 2 puffs into the lungs 2 (two) times daily as needed (respiratory issues.). 12/14/20   Laroy Apple M, DO  ibuprofen (ADVIL) 200 MG tablet Take 400 mg by mouth every 6 (six) hours as needed for moderate pain.    [provider]  loratadine-pseudoephedrine (CLARITIN-D 24-HOUR) 10-240 MG 24 hr tablet Take 1 tablet by mouth daily.    [provider]  Multiple Vitamin (MULTIVITAMIN) tablet Take 1 tablet by mouth daily.    [provider]  pantoprazole (PROTONIX) 40 MG tablet Take 1 tablet (40 mg total) by mouth daily. 05/31/23   Raquel James, NP  triamcinolone cream (KENALOG) 0.1 % Apply to affected area BID 01/08/20   Merlyn Albert, MD    Family History Family History  Problem Relation Age of Onset   Cancer Mother 2       endometrial cancer and cervical cancer    Cancer Maternal Grandmother        prostate cancer    Social History Social History   Tobacco Use   Smoking status: Never    Passive exposure: Never   Smokeless tobacco: Never  Vaping Use   Vaping Use: Never used  Substance Use Topics   Alcohol use: Yes    Comment: rarely    Drug use: No     Allergies   Omeprazole, Other, Adhesive [tape], and Latex   Review of Systems Review of Systems Per HPI  Physical Exam Triage Vital Signs ED Triage Vitals   Enc Vitals Group     BP 06/05/23 1413 139/72     Pulse Rate 06/05/23 1413 (!) 58     Resp 06/05/23 1413 18     Temp 06/05/23 1413 98.1 F (36.7 C)     Temp Source 06/05/23 1413 Oral     SpO2 06/05/23 1413 97 %     Weight --      Height --      Head Circumference --      Peak Flow --      Pain Score 06/05/23 1415 6     Pain Loc --      Pain Edu? --      Excl. in  GC? --    No data found.  Updated Vital Signs BP 139/72 (BP Location: Right Arm)   Pulse (!) 58   Temp 98.1 F (36.7 C) (Oral)   Resp 18   LMP 07/06/2004   SpO2 97%   Visual Acuity Right Eye Distance:   Left Eye Distance:   Bilateral Distance:    Right Eye Near:   Left Eye Near:    Bilateral Near:     Physical Exam Vitals and nursing note reviewed.  Constitutional:      General: She is not in acute distress.    Appearance: Normal appearance. She is not toxic-appearing.  HENT:     Mouth/Throat:     Mouth: Mucous membranes are moist.     Pharynx: Oropharynx is clear.  Pulmonary:     Effort: Pulmonary effort is normal. No respiratory distress.  Musculoskeletal:       Legs:     Comments: Inspection: no bruising, obvious deformity to left posterior thigh/hamstring Palpation: tender to palpation along hamstring and left greater trochanter; no obvious deformities palpated ROM: Full ROM to left lower extremity Strength: 5/5 bilateral lower extremities Neurovascular: neurovascularly intact in bilateral lower extremities  Skin:    General: Skin is warm and dry.     Capillary Refill: Capillary refill takes less than 2 seconds.     Coloration: Skin is not jaundiced or pale.     Findings: No erythema.  Neurological:     Mental Status: She is alert and oriented to person, place, and time.  Psychiatric:        Behavior: Behavior is cooperative.      UC Treatments / Results  Labs (all labs ordered are listed, but only abnormal results are displayed) Labs Reviewed - No data to  display  EKG   Radiology No results found.  Procedures Procedures (including critical care time)  Medications Ordered in UC Medications  ketorolac (TORADOL) 30 MG/ML injection 30 mg (30 mg Intramuscular Given 06/05/23 1433)    Initial Impression / Assessment and Plan / UC Course  I have reviewed the triage vital signs and the nursing notes.  Pertinent labs & imaging results that were available during my care of the patient were reviewed by me and considered in my medical decision making (see chart for details).   Patient is well-appearing, normotensive, afebrile, not tachycardic, not tachypneic, oxygenating well on room air.    1. Strain of left hamstring muscle, initial encounter Pain treated with Toradol 30 mg IM today in urgent care Discussed avoidance of NSAIDs for next 48 hours, start Tylenol 500 to 1000 mg every 6 hours as well as tizanidine Recommended light stretching/range of motion exercises Follow-up with sports medicine with no improvement/worsening symptoms despite treatment  The patient was given the opportunity to ask questions.  All questions answered to their satisfaction.  The patient is in agreement to this plan.    Final Clinical Impressions(s) / UC Diagnoses   Final diagnoses:  Strain of left hamstring muscle, initial encounter     Discharge Instructions      We have given you an injection of Toradol today for pain.  Do not take any other NSAIDs (ibuprofen, Aleve, naproxen) for the next 48 hours.  You can take Tylenol 500 to 1000 mg every 6 hours for pain.  You can also start using the muscle relaxant at nighttime as needed for pain.  Follow-up with sports medicine with no improvement or worsening of symptoms despite treatment.  ED Prescriptions     Medication Sig Dispense Auth. Provider   tiZANidine (ZANAFLEX) 4 MG tablet Take 1 tablet (4 mg total) by mouth at bedtime as needed for muscle spasms. Do not take with alcohol or while driving or  operating heavy machinery.  May cause drowsiness. 10 tablet Valentino Nose, NP      PDMP not reviewed this encounter.   Valentino Nose, NP 06/05/23 [phone]

## 2023-06-05 NOTE — Discharge Instructions (Addendum)
We have given you an injection of Toradol today for pain.  Do not take any other NSAIDs (ibuprofen, Aleve, naproxen) for the next 48 hours.  You can take Tylenol 500 to 1000 mg every 6 hours for pain.  You can also start using the muscle relaxant at nighttime as needed for pain.  Follow-up with sports medicine with no improvement or worsening of symptoms despite treatment.

## 2023-06-05 NOTE — ED Triage Notes (Signed)
Left hip pain that radiates down left leg to knee x 3 weeks.

## 2023-06-11 ENCOUNTER — Encounter (HOSPITAL_COMMUNITY): Payer: 59

## 2023-06-12 ENCOUNTER — Encounter: Payer: Self-pay | Admitting: Orthopaedic Surgery

## 2023-06-12 ENCOUNTER — Ambulatory Visit: Payer: 59 | Admitting: Orthopaedic Surgery

## 2023-06-12 VITALS — BP 110/69 | HR 72 | Ht 66.5 in | Wt 215.4 lb

## 2023-06-12 DIAGNOSIS — S76312A Strain of muscle, fascia and tendon of the posterior muscle group at thigh level, left thigh, initial encounter: Secondary | ICD-10-CM | POA: Diagnosis not present

## 2023-06-12 MED ORDER — IBUPROFEN 800 MG PO TABS: 800.0000 mg | ORAL_TABLET | Freq: Three times a day (TID) | ORAL | 5 refills | Status: DC | PRN

## 2023-06-12 MED ORDER — TIZANIDINE HCL 4 MG PO TABS: 4.0000 mg | ORAL_TABLET | Freq: Every evening | ORAL | 0 refills | Status: DC | PRN

## 2023-06-12 NOTE — Progress Notes (Signed)
Subjective:    Patient ID: Brenda [name], female    DOB: Oct 29, 1971, 52 y.o.   MRN: [id number]  HPI She has had injury to the left hamstring area.  She was seen at Urgent Care on 06-05-23.  She has been doing an exercise program at home and feels she may have overdone it.  I have reviewed the Urgent Care notes.  She has less pain of the lateral left hamstring today.  She says she is walking better.  She has no bruising.  She has no paresthesias.   Review of Systems  Constitutional:  Positive for activity change.  Musculoskeletal:  Positive for arthralgias and gait problem.  All other systems reviewed and are negative. For Review of Systems, all other systems reviewed and are negative.  The following is a summary of the past history medically, past history surgically, known current medicines, social history and family history.  This information is gathered electronically by the computer from prior information and documentation.  I review this each visit and have found including this information at this point in the chart is beneficial and informative.   Past Medical History:  Diagnosis Date   Asthma    GERD (gastroesophageal reflux disease)    Seasonal allergies     Past Surgical History:  Procedure Laterality Date   ABDOMINAL HYSTERECTOMY     BACK SURGERY     3-4 yrs ago.   BIOPSY  03/01/2020   Procedure: BIOPSY;  Surgeon: Malissa Hippo, MD;  [facility];  Service: Endoscopy;;   BIOPSY  06/29/2022   Procedure: BIOPSY;  Surgeon: Dolores Frame, MD;  [facility];  Service: Gastroenterology;;   BLADDER SURGERY     incontinence   BREAST REDUCTION SURGERY Bilateral 08/30/2015   Procedure: BREAST REDUCTION WITH LIPOSUCTION;  Surgeon: Louisa Second, MD;  Location: [facility];  Service: Plastics;  Laterality: Bilateral;   COLONOSCOPY N/A 03/01/2020   Procedure: COLONOSCOPY;  Surgeon: Malissa Hippo, MD;  [facility];   Service: Endoscopy;  Laterality: N/A;  730   EGD/ED     5 yr ago by Dr. Karilyn Cota   ESOPHAGEAL DILATION N/A 09/05/2017   Procedure: ESOPHAGEAL DILATION;  Surgeon: Malissa Hippo, MD;  [facility];  Service: Endoscopy;  Laterality: N/A;   ESOPHAGOGASTRODUODENOSCOPY N/A 09/05/2017   Procedure: ESOPHAGOGASTRODUODENOSCOPY (EGD);  Surgeon: Malissa Hippo, MD;  [facility];  Service: Endoscopy;  Laterality: N/A;  1:00   ESOPHAGOGASTRODUODENOSCOPY (EGD) WITH PROPOFOL N/A 06/29/2022   Procedure: ESOPHAGOGASTRODUODENOSCOPY (EGD) WITH PROPOFOL;  Surgeon: Dolores Frame, MD;  [facility];  Service: Gastroenterology;  Laterality: N/A;  205 ASA 1   HIGH RISK BREAST EXCISION Right 2011   REDUCTION MAMMAPLASTY Bilateral 2016    Current Outpatient Medications on File Prior to Visit  Medication Sig Dispense Refill   albuterol (PROAIR HFA) 108 (90 Base) MCG/ACT inhaler Inhale 2 puffs into the lungs every 4 (four) hours as needed for wheezing or shortness of breath. 16 g 1   albuterol (PROVENTIL) (2.5 MG/3ML) 0.083% nebulizer solution Use q 4 hrs via nebulizer prn wheezing 75 mL 0   fluticasone (FLONASE) 50 MCG/ACT nasal spray Place 2 sprays into both nostrils daily. (Patient taking differently: Place 2 sprays into both nostrils daily as needed for allergies.) 16 g 6   fluticasone (FLOVENT HFA) 44 MCG/ACT inhaler Inhale 2 puffs into the lungs 2 (two) times daily as needed (respiratory issues.).  1 each 2   loratadine-pseudoephedrine (CLARITIN-D 24-HOUR) 10-240 MG 24 hr tablet Take 1 tablet by mouth daily.     Multiple Vitamin (MULTIVITAMIN) tablet Take 1 tablet by mouth daily.     pantoprazole (PROTONIX) 40 MG tablet Take 1 tablet (40 mg total) by mouth daily. 90 tablet 3   triamcinolone cream (KENALOG) 0.1 % Apply to affected area BID (Patient not taking: Reported on 06/12/2023) 30 g 2   No current facility-administered medications on file prior to visit.    Social  History   Socioeconomic History   Marital status: Married    Spouse name: Not on file   Number of children: Not on file   Years of education: Not on file   Highest education level: Not on file  Occupational History   Not on file  Tobacco Use   Smoking status: Never    Passive exposure: Never   Smokeless tobacco: Never  Vaping Use   Vaping Use: Never used  Substance and Sexual Activity   Alcohol use: Yes    Comment: rarely    Drug use: No   Sexual activity: Yes    Birth control/protection: None  Other Topics Concern   Not on file  Social History Narrative   Not on file   Social Determinants of Health   Financial Resource Strain: Low Risk  (08/31/2021)   Overall Financial Resource Strain (CARDIA)    Difficulty of Paying Living Expenses: Not very hard  Food Insecurity: Unknown (08/31/2021)   Hunger Vital Sign    Worried About Running Out of Food in the Last Year: Patient declined    Ran Out of Food in the Last Year: Patient declined  Transportation Needs: No Transportation Needs (08/31/2021)   PRAPARE - Administrator, Civil Service (Medical): No    Lack of Transportation (Non-Medical): No  Physical Activity: Insufficiently Active (08/31/2021)   Exercise Vital Sign    Days of Exercise per Week: 2 days    Minutes of Exercise per Session: 30 min  Stress: Stress Concern Present (08/31/2021)   [employer] of Occupational Health - Occupational Stress Questionnaire    Feeling of Stress : Very much  Social Connections: Moderately Integrated (08/31/2021)   Social Connection and Isolation Panel [NHANES]    Frequency of Communication with Friends and Family: More than three times a week    Frequency of Social Gatherings with Friends and Family: Three times a week    Attends Religious Services: 1 to 4 times per year    Active Member of Clubs or Organizations: No    Attends Banker Meetings: Never    Marital Status: Married  Catering manager Violence:  Not At Risk (08/31/2021)   Humiliation, Afraid, Rape, and Kick questionnaire    Fear of Current or Ex-Partner: No    Emotionally Abused: No    Physically Abused: No    Sexually Abused: No    Family History  Problem Relation Age of Onset   Cancer Mother 51       endometrial cancer and cervical cancer    Cancer Maternal Grandmother        prostate cancer    BP 110/69   Pulse 72   Ht 5' 6.5" (1.689 m)   Wt 215 lb 6 oz (97.7 kg)   LMP 07/06/2004   BMI 34.24 kg/m   Body mass index is 34.24 kg/m.      Objective:   Physical Exam Vitals and nursing note reviewed.  Exam conducted with a chaperone present.  Constitutional:      Appearance: She is well-developed.  HENT:     Head: Normocephalic and atraumatic.  Eyes:     Conjunctiva/sclera: Conjunctivae normal.     Pupils: Pupils are equal, round, and reactive to light.  Cardiovascular:     Rate and Rhythm: Normal rate and regular rhythm.  Pulmonary:     Effort: Pulmonary effort is normal.  Abdominal:     Palpations: Abdomen is soft.  Musculoskeletal:     Cervical back: Normal range of motion and neck supple.       Legs:  Skin:    General: Skin is warm and dry.  Neurological:     Mental Status: She is alert and oriented to person, place, and time.     Cranial Nerves: No cranial nerve deficit.     Motor: No abnormal muscle tone.     Coordination: Coordination normal.     Deep Tendon Reflexes: Reflexes are normal and symmetric. Reflexes normal.  Psychiatric:        Behavior: Behavior normal.        Thought Content: Thought content normal.        Judgment: Judgment normal.           Assessment & Plan:   Encounter Diagnosis  Name Primary?   Hamstring strain, left, initial encounter Yes   I have explained the findings to her.  I do not feel a defect.  I do not feel a MRI is needed.  It will take several weeks to resolve.  She was given instructions for rubs, ice and to decrease activity.  I called in ibuprofen  800.  She will get [employer] for work.  Return in two weeks.  Call if any problem.  Precautions discussed.  Electronically Signed Darreld Mclean, MD 7/9/202410:53 AM

## 2023-06-26 ENCOUNTER — Encounter: Payer: Self-pay | Admitting: Orthopaedic Surgery

## 2023-06-26 ENCOUNTER — Ambulatory Visit: Payer: 59 | Admitting: Orthopaedic Surgery

## 2023-06-26 DIAGNOSIS — S76312D Strain of muscle, fascia and tendon of the posterior muscle group at thigh level, left thigh, subsequent encounter: Secondary | ICD-10-CM

## 2023-06-26 NOTE — Progress Notes (Signed)
I am a little better.  Her left hamstring pain is less.  She has some medial quad pain at times.  She has missed only one day at work for this.  She is walking better and is taking her ibuprofen.  Hamstrings are not that tender today on the left with full ROM of the left knee.  Gait is good. NV intact.  Encounter Diagnosis  Name Primary?   Hamstring strain, left, subsequent encounter Yes   I will see her in two weeks.   Continue the ibuprofen.  Call if any problem.  Precautions discussed.  Electronically Signed Darreld Mclean, MD 7/23/20248:11 AM

## 2023-06-26 NOTE — Patient Instructions (Signed)
Use Aspercreme, Biofreeze or Voltaren gel over the counter 2-3 times daily make sure you rub it in well each time you use it.   

## 2023-07-07 ENCOUNTER — Telehealth: Payer: Self-pay | Admitting: Orthopaedic Surgery

## 2023-07-10 ENCOUNTER — Ambulatory Visit: Payer: 59 | Admitting: Orthopaedic Surgery

## 2023-10-30 ENCOUNTER — Encounter (INDEPENDENT_AMBULATORY_CARE_PROVIDER_SITE_OTHER): Payer: Self-pay | Admitting: Gastroenterology

## 2023-12-03 ENCOUNTER — Ambulatory Visit (INDEPENDENT_AMBULATORY_CARE_PROVIDER_SITE_OTHER): Payer: 59 | Admitting: Gastroenterology

## 2023-12-07 ENCOUNTER — Encounter: Payer: Self-pay | Admitting: Physician Assistant

## 2023-12-07 ENCOUNTER — Other Ambulatory Visit (INDEPENDENT_AMBULATORY_CARE_PROVIDER_SITE_OTHER): Payer: 59 | Admitting: Physician Assistant

## 2023-12-07 ENCOUNTER — Telehealth: Payer: Self-pay | Admitting: Physician Assistant

## 2023-12-07 DIAGNOSIS — Z130 Encounter for screening for diseases of the blood and blood-forming organs and certain disorders involving the immune mechanism: Secondary | ICD-10-CM

## 2023-12-07 DIAGNOSIS — Z1329 Encounter for screening for other suspected endocrine disorder: Secondary | ICD-10-CM

## 2023-12-07 DIAGNOSIS — Z131 Encounter for screening for diabetes mellitus: Secondary | ICD-10-CM

## 2023-12-07 DIAGNOSIS — Z791 Long term (current) use of non-steroidal anti-inflammatories (NSAID): Secondary | ICD-10-CM

## 2023-12-07 DIAGNOSIS — Z13220 Encounter for screening for lipoid disorders: Secondary | ICD-10-CM

## 2023-12-07 NOTE — Telephone Encounter (Signed)
 Patient is requesting to get labs done before appointment on 12/11/23

## 2023-12-11 ENCOUNTER — Encounter: Payer: 59 | Admitting: Nurse Practitioner

## 2023-12-11 ENCOUNTER — Ambulatory Visit (INDEPENDENT_AMBULATORY_CARE_PROVIDER_SITE_OTHER): Payer: 59 | Admitting: Physician Assistant

## 2023-12-11 ENCOUNTER — Encounter: Payer: Self-pay | Admitting: Physician Assistant

## 2023-12-11 ENCOUNTER — Telehealth: Payer: Self-pay | Admitting: Physician Assistant

## 2023-12-11 VITALS — BP 126/80 | Ht 66.5 in | Wt 221.1 lb

## 2023-12-11 DIAGNOSIS — J683 Other acute and subacute respiratory conditions due to chemicals, gases, fumes and vapors: Secondary | ICD-10-CM | POA: Diagnosis not present

## 2023-12-11 DIAGNOSIS — Z0001 Encounter for general adult medical examination with abnormal findings: Secondary | ICD-10-CM

## 2023-12-11 DIAGNOSIS — Z7689 Persons encountering health services in other specified circumstances: Secondary | ICD-10-CM

## 2023-12-11 DIAGNOSIS — R7309 Other abnormal glucose: Secondary | ICD-10-CM | POA: Diagnosis not present

## 2023-12-11 DIAGNOSIS — J0110 Acute frontal sinusitis, unspecified: Secondary | ICD-10-CM | POA: Diagnosis not present

## 2023-12-11 DIAGNOSIS — Z1231 Encounter for screening mammogram for malignant neoplasm of breast: Secondary | ICD-10-CM

## 2023-12-11 LAB — CMP14+EGFR
ALT: 19 [IU]/L (ref 0–32)
AST: 23 [IU]/L (ref 0–40)
Albumin: 4.3 g/dL (ref 3.8–4.9)
Alkaline Phosphatase: 120 [IU]/L (ref 44–121)
BUN/Creatinine Ratio: 9 (ref 9–23)
BUN: 8 mg/dL (ref 6–24)
Bilirubin Total: 0.5 mg/dL (ref 0.0–1.2)
CO2: 25 mmol/L (ref 20–29)
Calcium: 9.5 mg/dL (ref 8.7–10.2)
Chloride: 100 mmol/L (ref 96–106)
Creatinine, Ser: 0.92 mg/dL (ref 0.57–1.00)
Globulin, Total: 2.6 g/dL (ref 1.5–4.5)
Glucose: 92 mg/dL (ref 70–99)
Potassium: 4.5 mmol/L (ref 3.5–5.2)
Sodium: 140 mmol/L (ref 134–144)
Total Protein: 6.9 g/dL (ref 6.0–8.5)
eGFR: 74 mL/min/{1.73_m2} (ref >59)

## 2023-12-11 LAB — HEMOGLOBIN A1C
Est. average glucose Bld gHb Est-mCnc: 120 mg/dL
Hgb A1c MFr Bld: 5.8 % — ABNORMAL HIGH (ref 4.8–5.6)

## 2023-12-11 LAB — CBC WITH DIFFERENTIAL/PLATELET
Basophils Absolute: 0.1 10*3/uL (ref 0.0–0.2)
Basos: 1 %
EOS (ABSOLUTE): 0.8 10*3/uL — ABNORMAL HIGH (ref 0.0–0.4)
Eos: 10 %
Hematocrit: 43 % (ref 34.0–46.6)
Hemoglobin: 14.2 g/dL (ref 11.1–15.9)
Immature Grans (Abs): 0 10*3/uL (ref 0.0–0.1)
Immature Granulocytes: 0 %
Lymphocytes Absolute: 1.8 10*3/uL (ref 0.7–3.1)
Lymphs: 25 %
MCH: 28.2 pg (ref 26.6–33.0)
MCHC: 33 g/dL (ref 31.5–35.7)
MCV: 85 fL (ref 79–97)
Monocytes Absolute: 0.5 10*3/uL (ref 0.1–0.9)
Monocytes: 6 %
Neutrophils Absolute: 4.2 10*3/uL (ref 1.4–7.0)
Neutrophils: 58 %
Platelets: 316 10*3/uL (ref 150–450)
RBC: 5.04 x10E6/uL (ref 3.77–5.28)
RDW: 13.1 % (ref 11.7–15.4)
WBC: 7.4 10*3/uL (ref 3.4–10.8)

## 2023-12-11 LAB — LIPID PANEL
Chol/HDL Ratio: 3.4 {ratio} (ref 0.0–4.4)
Cholesterol, Total: 223 mg/dL — ABNORMAL HIGH (ref 100–199)
HDL: 65 mg/dL (ref >39)
LDL Chol Calc (NIH): 135 mg/dL — ABNORMAL HIGH (ref 0–99)
Triglycerides: 128 mg/dL (ref 0–149)
VLDL Cholesterol Cal: 23 mg/dL (ref 5–40)

## 2023-12-11 LAB — TSH+FREE T4
Free T4: 1.17 ng/dL (ref 0.82–1.77)
TSH: 2.78 u[IU]/mL (ref 0.450–4.500)

## 2023-12-11 MED ORDER — FLUTICASONE PROPIONATE HFA 44 MCG/ACT IN AERO: 2.0000 | INHALATION_SPRAY | Freq: Every day | RESPIRATORY_TRACT | 5 refills | Status: AC

## 2023-12-11 MED ORDER — DOXYCYCLINE HYCLATE 100 MG PO TABS: 100.0000 mg | ORAL_TABLET | Freq: Two times a day (BID) | ORAL | 0 refills | Status: AC

## 2023-12-11 MED ORDER — SPACER/AERO-HOLDING CHAMBERS DEVI: 0 refills | Status: AC

## 2023-12-11 MED ORDER — ALBUTEROL SULFATE HFA 108 (90 BASE) MCG/ACT IN AERS: 2.0000 | INHALATION_SPRAY | RESPIRATORY_TRACT | 3 refills | Status: AC | PRN

## 2023-12-11 NOTE — Progress Notes (Signed)
 New Patient Office Visit  Subjective    Patient ID: Brenda [name], female    DOB: 1971/11/11  Age: 53 y.o. MRN: [id number]  CC:  Chief Complaint  Patient presents with   Annual Exam    Establish care- discuss recent labs Had Covid a month ago and still has thick green congestion    HPI Brenda [name] presents to establish care  This 53 year old female presents for a comprehensive annual health maintenance visit. Medical problems include reactive airway disease, asthma, GERD, seasonal allergies, and back pain. Patient has concerns as described below and remains stable at this time. Patient does have allergies to adhesive, latex, grass, and dogs. Vaccinations are up to date. Health maintenance is mostly up to date. She reports a poor diet recently. Patient doesn't participate in regular physical activity.  Patient also presents with concerns for sinus pressure and congestion.  Patient states approximately 1 month ago she had COVID, reports symptoms cleared up for about 2 weeks and then around Christmas time began experiencing congestion, ear pressure, sinus pressure, nasal discharge.  Patient also reports chronic allergies.  Outpatient Encounter Medications as of 12/11/2023  Medication Sig   doxycycline  (VIBRA -TABS) 100 MG tablet Take 1 tablet (100 mg total) by mouth 2 (two) times daily for 7 days.   fluticasone  (FLOVENT  HFA) 44 MCG/ACT inhaler Inhale 2 puffs into the lungs daily.   Spacer/Aero-Holding Raguel FRENCH For use with prescribed inhalers   albuterol  (PROAIR  HFA) 108 (90 Base) MCG/ACT inhaler Inhale 2 puffs into the lungs every 4 (four) hours as needed for wheezing or shortness of breath.   ibuprofen  (ADVIL ) 800 MG tablet Take 1 tablet (800 mg total) by mouth every 8 (eight) hours as needed.   loratadine-pseudoephedrine (CLARITIN-D 24-HOUR) 10-240 MG 24 hr tablet Take 1 tablet by mouth daily.   Multiple Vitamin (MULTIVITAMIN) tablet Take 1 tablet by mouth daily.   pantoprazole   (PROTONIX ) 40 MG tablet Take 1 tablet (40 mg total) by mouth daily.   [DISCONTINUED] albuterol  (PROAIR  HFA) 108 (90 Base) MCG/ACT inhaler Inhale 2 puffs into the lungs every 4 (four) hours as needed for wheezing or shortness of breath.   [DISCONTINUED] albuterol  (PROVENTIL ) (2.5 MG/3ML) 0.083% nebulizer solution Use q 4 hrs via nebulizer prn wheezing   [DISCONTINUED] fluticasone  (FLONASE ) 50 MCG/ACT nasal spray Place 2 sprays into both nostrils daily. (Patient taking differently: Place 2 sprays into both nostrils daily as needed for allergies.)   [DISCONTINUED] fluticasone  (FLOVENT  HFA) 44 MCG/ACT inhaler Inhale 2 puffs into the lungs 2 (two) times daily as needed (respiratory issues.).   [DISCONTINUED] tiZANidine  (ZANAFLEX ) 4 MG tablet TAKE 1 TABLET BY MOUTH EVERY NIGHT AT BEDTIME AS NEEDED FOR MUSCLE SPASMS. DO NOT TAKE WITH ALCOHOL OR WHILE DRIVING..   [DISCONTINUED] triamcinolone  cream (KENALOG ) 0.1 % Apply to affected area BID   No facility-administered encounter medications on file as of 12/11/2023.    Past Medical History:  Diagnosis Date   Asthma    GERD (gastroesophageal reflux disease)    Seasonal allergies     Past Surgical History:  Procedure Laterality Date   ABDOMINAL HYSTERECTOMY     BACK SURGERY     3-4 yrs ago.   BIOPSY  03/01/2020   Procedure: BIOPSY;  Surgeon: Golda Claudis PENNER, MD;  [facility];  Service: Endoscopy;;   BIOPSY  06/29/2022   Procedure: BIOPSY;  Surgeon: Eartha Angelia Sieving, MD;  [facility];  Service: Gastroenterology;;   BLADDER SURGERY  incontinence   BREAST REDUCTION SURGERY Bilateral 08/30/2015   Procedure: BREAST REDUCTION WITH LIPOSUCTION;  Surgeon: Elna Pick, MD;  Location: [facility];  Service: Plastics;  Laterality: Bilateral;   COLONOSCOPY N/A 03/01/2020   Procedure: COLONOSCOPY;  Surgeon: Golda Claudis PENNER, MD;  [facility];  Service: Endoscopy;  Laterality: N/A;  730   EGD/ED      5 yr ago by Dr. Golda   ESOPHAGEAL DILATION N/A 09/05/2017   Procedure: ESOPHAGEAL DILATION;  Surgeon: Golda Claudis PENNER, MD;  [facility];  Service: Endoscopy;  Laterality: N/A;   ESOPHAGOGASTRODUODENOSCOPY N/A 09/05/2017   Procedure: ESOPHAGOGASTRODUODENOSCOPY (EGD);  Surgeon: Golda Claudis PENNER, MD;  [facility];  Service: Endoscopy;  Laterality: N/A;  1:00   ESOPHAGOGASTRODUODENOSCOPY (EGD) WITH PROPOFOL  N/A 06/29/2022   Procedure: ESOPHAGOGASTRODUODENOSCOPY (EGD) WITH PROPOFOL ;  Surgeon: Eartha Angelia Sieving, MD;  [facility];  Service: Gastroenterology;  Laterality: N/A;  205 ASA 1   HIGH RISK BREAST EXCISION Right 2011   REDUCTION MAMMAPLASTY Bilateral 2016    Family History  Problem Relation Age of Onset   Cancer Mother 59       endometrial cancer and cervical cancer    Cancer Maternal Grandmother        prostate cancer    Social History   Socioeconomic History   Marital status: Married    Spouse name: Not on file   Number of children: Not on file   Years of education: Not on file   Highest education level: Not on file  Occupational History   Not on file  Tobacco Use   Smoking status: Never    Passive exposure: Never   Smokeless tobacco: Never  Vaping Use   Vaping status: Never Used  Substance and Sexual Activity   Alcohol use: Yes    Comment: rarely    Drug use: No   Sexual activity: Yes    Birth control/protection: None  Other Topics Concern   Not on file  Social History Narrative   Not on file   Social Drivers of Health   Financial Resource Strain: Low Risk  (08/31/2021)   Overall Financial Resource Strain (CARDIA)    Difficulty of Paying Living Expenses: Not very hard  Food Insecurity: Unknown (08/31/2021)   Hunger Vital Sign    Worried About Running Out of Food in the Last Year: Patient declined    Ran Out of Food in the Last Year: Patient declined  Transportation Needs: No Transportation Needs (08/31/2021)    PRAPARE - Administrator, Civil Service (Medical): No    Lack of Transportation (Non-Medical): No  Physical Activity: Insufficiently Active (08/31/2021)   Exercise Vital Sign    Days of Exercise per Week: 2 days    Minutes of Exercise per Session: 30 min  Stress: Stress Concern Present (08/31/2021)   [employer] of Occupational Health - Occupational Stress Questionnaire    Feeling of Stress : Very much  Social Connections: Moderately Integrated (08/31/2021)   Social Connection and Isolation Panel [NHANES]    Frequency of Communication with Friends and Family: More than three times a week    Frequency of Social Gatherings with Friends and Family: Three times a week    Attends Religious Services: 1 to 4 times per year    Active Member of Clubs or Organizations: No    Attends Banker Meetings: Never    Marital Status: Married  Catering Manager Violence: Not At  Risk (08/31/2021)   Humiliation, Afraid, Rape, and Kick questionnaire    Fear of Current or Ex-Partner: No    Emotionally Abused: No    Physically Abused: No    Sexually Abused: No    Review of Systems  Constitutional:  Negative for fever and malaise/fatigue.  HENT:  Positive for congestion, ear pain (pressure) and sinus pain.   Respiratory:  Negative for cough and shortness of breath.   Cardiovascular:  Negative for chest pain and palpitations.  Gastrointestinal:  Positive for heartburn.  Musculoskeletal:  Negative for myalgias.  Neurological:  Positive for headaches.        Objective    BP 126/80   Ht 5' 6.5 (1.689 m)   Wt 221 lb 1.6 oz (100.3 kg)   LMP 07/06/2004   BMI 35.15 kg/m   Physical Exam Constitutional:      Appearance: Normal appearance. She is obese.  HENT:     Head: Normocephalic.     Right Ear: Tympanic membrane normal.     Left Ear: Tympanic membrane normal.     Nose: Congestion and rhinorrhea present.     Mouth/Throat:     Mouth: Mucous membranes are moist.      Pharynx: Oropharynx is clear. Posterior oropharyngeal erythema present.  Eyes:     Extraocular Movements: Extraocular movements intact.     Conjunctiva/sclera: Conjunctivae normal.  Neck:     Thyroid : No thyroid  mass, thyromegaly or thyroid  tenderness.  Cardiovascular:     Rate and Rhythm: Normal rate and regular rhythm.     Heart sounds: No murmur heard.    No gallop.  Pulmonary:     Effort: Pulmonary effort is normal.     Breath sounds: Normal breath sounds. No wheezing, rhonchi or rales.  Abdominal:     General: Abdomen is flat. Bowel sounds are normal.     Palpations: Abdomen is soft.     Tenderness: There is no abdominal tenderness.  Musculoskeletal:     Cervical back: Normal range of motion.  Skin:    General: Skin is warm and dry.     Capillary Refill: Capillary refill takes less than 2 seconds.  Neurological:     General: No focal deficit present.     Mental Status: She is alert and oriented to person, place, and time.  Psychiatric:        Mood and Affect: Mood normal.        Behavior: Behavior normal.    Recent Results (from the past 2160 hours)  CBC with Differential     Status: Abnormal   Collection Time: 12/10/23  8:38 AM  Result Value Ref Range   WBC 7.4 3.4 - 10.8 x10E3/uL   RBC 5.04 3.77 - 5.28 x10E6/uL   Hemoglobin 14.2 11.1 - 15.9 g/dL   Hematocrit 56.9 65.9 - 46.6 %   MCV 85 79 - 97 fL   MCH 28.2 26.6 - 33.0 pg   MCHC 33.0 31.5 - 35.7 g/dL   RDW 86.8 88.2 - 84.5 %   Platelets 316 150 - 450 x10E3/uL   Neutrophils 58 Not Estab. %   Lymphs 25 Not Estab. %   Monocytes 6 Not Estab. %   Eos 10 Not Estab. %   Basos 1 Not Estab. %   Neutrophils Absolute 4.2 1.4 - 7.0 x10E3/uL   Lymphocytes Absolute 1.8 0.7 - 3.1 x10E3/uL   Monocytes Absolute 0.5 0.1 - 0.9 x10E3/uL   EOS (ABSOLUTE) 0.8 (H) 0.0 - 0.4 x10E3/uL  Basophils Absolute 0.1 0.0 - 0.2 x10E3/uL   Immature Granulocytes 0 Not Estab. %   Immature Grans (Abs) 0.0 0.0 - 0.1 x10E3/uL  CMP14+EGFR      Status: None   Collection Time: 12/10/23  8:38 AM  Result Value Ref Range   Glucose 92 70 - 99 mg/dL   BUN 8 6 - 24 mg/dL   Creatinine, Ser 9.07 0.57 - 1.00 mg/dL   eGFR 74 >40 fO/fpw/8.26   BUN/Creatinine Ratio 9 9 - 23   Sodium 140 134 - 144 mmol/L   Potassium 4.5 3.5 - 5.2 mmol/L   Chloride 100 96 - 106 mmol/L   CO2 25 20 - 29 mmol/L   Calcium 9.5 8.7 - 10.2 mg/dL   Total Protein 6.9 6.0 - 8.5 g/dL   Albumin 4.3 3.8 - 4.9 g/dL   Globulin, Total 2.6 1.5 - 4.5 g/dL   Bilirubin Total 0.5 0.0 - 1.2 mg/dL   Alkaline Phosphatase 120 44 - 121 IU/L   AST 23 0 - 40 IU/L   ALT 19 0 - 32 IU/L  Lipid Panel     Status: Abnormal   Collection Time: 12/10/23  8:38 AM  Result Value Ref Range   Cholesterol, Total 223 (H) 100 - 199 mg/dL   Triglycerides 871 0 - 149 mg/dL   HDL 65 >60 mg/dL   VLDL Cholesterol Cal 23 5 - 40 mg/dL   LDL Chol Calc (NIH) 864 (H) 0 - 99 mg/dL   Chol/HDL Ratio 3.4 0.0 - 4.4 ratio    Comment:                                   T. Chol/HDL Ratio                                             Men  Women                               1/2 Avg.Risk  3.4    3.3                                   Avg.Risk  5.0    4.4                                2X Avg.Risk  9.6    7.1                                3X Avg.Risk 23.4   11.0   TSH + free T4     Status: None   Collection Time: 12/10/23  8:38 AM  Result Value Ref Range   TSH 2.780 0.450 - 4.500 uIU/mL   Free T4 1.17 0.82 - 1.77 ng/dL  Hemoglobin J8r     Status: Abnormal   Collection Time: 12/10/23  8:38 AM  Result Value Ref Range   Hgb A1c MFr Bld 5.8 (H) 4.8 - 5.6 %    Comment:          Prediabetes: 5.7 - 6.4  Diabetes: >6.4          Glycemic control for adults with diabetes: <7.0    Est. average glucose Bld gHb Est-mCnc 120 mg/dL       Assessment & Plan:  Encounter to establish care  Reactive airways dysfunction syndrome (HCC) -     Albuterol  Sulfate HFA; Inhale 2 puffs into the lungs every 4 (four)  hours as needed for wheezing or shortness of breath.  Dispense: 16 g; Refill: 3 -     Fluticasone  Propionate HFA; Inhale 2 puffs into the lungs daily.  Dispense: 1 each; Refill: 5 -     Spacer/Aero-Holding Chambers; For use with prescribed inhalers  Dispense: 1 each; Refill: 0  Acute non-recurrent frontal sinusitis -     Doxycycline  Hyclate; Take 1 tablet (100 mg total) by mouth 2 (two) times daily for 7 days.  Dispense: 14 tablet; Refill: 0  Encounter for screening mammogram for malignant neoplasm of breast -     3D Screening Mammogram, Left and Right  Elevated hemoglobin A1c -     Amb Referral to Nutrition and Diabetic Education  Adult wellness- complete wellness physical was conducted today. Importance of diet and exercise were discussed in detail.  Importance of stress reduction and healthy living were discussed.  In addition to this a discussion regarding safety was also covered.  We also reviewed over immunizations and gave recommendations regarding current immunization needed for age.   Lab work reviewed with patient today.  In addition to this additional areas were also touched on including: Sinus concerns.  Presentation was consistent with sinusitis.  No evidence of other bacterial infections including pneumonia, pharyngitis, otitis media, or orbital cellulitis. Discussed that this fits the picture of viral vs bacterial sinusitis and that due to type and duration of symptoms and exam findings, we will treat as bacterial sinusitis.  Antibiotics prescribed. Advised to continue ibuprofen  and Tylenol  at home. The patient was instructed to return if the worsens in any way, especially if not tolerating fluids, increased sinus pain or swelling, worsening headache, persistent fever, difficulty swallowing or breathing, or as needed. The patient agreed with the plan.   Preventative health exams needed: Pap test and HPV testing, mammogram ordered today   Colonoscopy up to date until  2031.  Patient was advised yearly wellness exam.   Return in about 6 months (around 06/09/2024) for cholesterol and A1c .   Charmaine Tomika Eckles, PA-C

## 2023-12-11 NOTE — Patient Instructions (Addendum)
 Doxycycline [facility] Referral to nutrition  Mammogram order Inhalers refilled  Spacer ordered  Follow up in 6 months- cholesterol and A1c

## 2023-12-16 ENCOUNTER — Encounter (INDEPENDENT_AMBULATORY_CARE_PROVIDER_SITE_OTHER): Payer: Self-pay

## 2023-12-17 ENCOUNTER — Ambulatory Visit (INDEPENDENT_AMBULATORY_CARE_PROVIDER_SITE_OTHER): Payer: 59 | Admitting: Gastroenterology

## 2023-12-17 NOTE — Telephone Encounter (Signed)
 error

## 2023-12-18 ENCOUNTER — Other Ambulatory Visit (HOSPITAL_COMMUNITY): Payer: Self-pay | Admitting: Physician Assistant

## 2023-12-18 DIAGNOSIS — Z1231 Encounter for screening mammogram for malignant neoplasm of breast: Secondary | ICD-10-CM

## 2023-12-20 ENCOUNTER — Inpatient Hospital Stay (HOSPITAL_COMMUNITY): Admission: RE | Admit: 2023-12-20 | Payer: 59 | Source: Ambulatory Visit

## 2023-12-20 ENCOUNTER — Ambulatory Visit: Payer: Self-pay | Admitting: Physician Assistant

## 2023-12-20 ENCOUNTER — Telehealth: Payer: Self-pay | Admitting: Physician Assistant

## 2023-12-20 DIAGNOSIS — Z1231 Encounter for screening mammogram for malignant neoplasm of breast: Secondary | ICD-10-CM

## 2023-12-20 NOTE — Telephone Encounter (Addendum)
1st attempt to call patient. No answer. LVM.   Copied from CRM [id number]. Topic: Clinical - Medication Refill >> Dec 20, 2023 10:27 AM Elle L wrote: Most Recent Primary Care Visit:  Provider: GROOMS, COURTNEY  Department: [facility]  Visit Type: NEW PT - PHYSICAL  Date: 12/11/2023  Medication: The patient states that she finished her antibiotics and that her provider requested her to call back to request more medications if they did not work.   Has the patient contacted their pharmacy? No  Is this the correct pharmacy for this prescription? Yes  This is the patient's preferred pharmacy:  [id number] - [place], [place] - [address] [place] [address] Phone: [phone] Fax: [phone]  Has the prescription been filled recently? No  Is the patient out of the medication? Yes  Has the patient been seen for an appointment in the last year OR does the patient have an upcoming appointment? Yes  Can we respond through MyChart? Yes [phone]  Agent: Please be advised that Rx refills may take up to 3 business days. We ask that you follow-up with your pharmacy.

## 2023-12-20 NOTE — Telephone Encounter (Unsigned)
Copied from CRM [id number]. Topic: Clinical - Medication Refill >> Dec 20, 2023 10:27 AM Elle L wrote: Most Recent Primary Care Visit:  Provider: GROOMS, COURTNEY  Department: [facility]  Visit Type: NEW PT - PHYSICAL  Date: 12/11/2023  Medication: The patient states that she finished her antibiotics and that her provider requested her to call back to request more medications if they did not work.   Has the patient contacted their pharmacy? No  Is this the correct pharmacy for this prescription? Yes If no, delete pharmacy and type the correct one.  This is the patient's preferred pharmacy:  [id number] - [place], [place] - [address] [place] [address] Phone: [phone] Fax: [phone]   Has the prescription been filled recently? No  Is the patient out of the medication? Yes  Has the patient been seen for an appointment in the last year OR does the patient have an upcoming appointment? Yes  Can we respond through MyChart? Yes [phone]  Agent: Please be advised that Rx refills may take up to 3 business days. We ask that you follow-up with your pharmacy.

## 2023-12-20 NOTE — Telephone Encounter (Signed)
Copied from CRM [id number]. Topic: Clinical - Medication Refill >> Dec 20, 2023 10:27 AM Elle L wrote: Most Recent Primary Care Visit:  Provider: GROOMS, COURTNEY  Department: [facility]  Visit Type: NEW PT - PHYSICAL  Date: 12/11/2023  Medication: The patient states that she finished her antibiotics and that her provider requested her to call back to request more medications if they did not work.   Has the patient contacted their pharmacy? No  Is this the correct pharmacy for this prescription? Yes  This is the patient's preferred pharmacy:  [id number] - [place], [place] - [address] [place] [address] Phone: [phone] Fax: [phone]  Has the prescription been filled recently? No  Is the patient out of the medication? Yes  Has the patient been seen for an appointment in the last year OR does the patient have an upcoming appointment? Yes  Can we respond through MyChart? Yes [phone]  Agent: Please be advised that Rx refills may take up to 3 business days. We ask that you follow-up with your pharmacy.   Chief Complaint: Sinus congestion  Symptoms: Nasal congestion, green nasal discharge  Frequency: Constant  Pertinent Negatives: Patient denies fevers, pain Disposition: [] ED /[] Urgent Care (no appt availability in office) / [] Appointment(In office/virtual)/ []  [facility]/ [x] Home Care/ [] Refused Recommended Disposition /[] [facility]/ []  Follow-up with PCP Additional Notes: Patient reports that she was prescribed an antibiotic for a sinus infection. She states that she finished the antibiotic today and states that her symptoms are improved, but she is still experiencing nasal congestion and green nasal discharge. Patient requesting another antibiotic prescription or other medication to help with symptoms.  Home care instructions discussed with patient.      Reason for Disposition  [1] Taking antibiotic AND [2] nose still blocked  Answer Assessment - Initial Assessment Questions 1. ANTIBIOTIC: "What antibiotic are you taking?" "How many times a day?"     Doxycycline  2. ONSET: "When was the antibiotic started?"     12/11/23, took last today 3. PAIN: "How bad is the sinus pain?"   (Scale 1-10; mild, moderate or severe)   - MILD (1-3): doesn't interfere with normal activities    - MODERATE (4-7): interferes with normal activities (e.g., work or school) or awakens from sleep   - SEVERE (8-10): excruciating pain and patient unable to do any normal activities        Mild 4. FEVER: "Do you have a fever?" If Yes, ask: "What is it, how was it measured, and when did it start?"      No 5. SYMPTOMS: "Are there any other symptoms you're concerned about?" If Yes, ask: "When did it start?"     Green nasal discharge, nasal congestion  6. PREGNANCY: "Is there any chance you are pregnant?" "When was your last menstrual period?"     No  Protocols used: Sinus Infection on Antibiotic Follow-up Call-A-AH

## 2023-12-21 ENCOUNTER — Encounter: Payer: Self-pay | Admitting: *Deleted

## 2023-12-21 NOTE — Telephone Encounter (Signed)
 Patient notified of provider's recommendations via my chart

## 2023-12-21 NOTE — Telephone Encounter (Signed)
Grooms, [place], [place]     Patient should schedule follow up appointment if she is still concerned about congestion. She was treated for sinusitis with antibiotic. Symptoms likely viral in nature if persistent, or could be related to a chronic sinus issue that would need to be worked up by ENT.

## 2024-01-07 ENCOUNTER — Ambulatory Visit (HOSPITAL_COMMUNITY)
Admission: RE | Admit: 2024-01-07 | Discharge: 2024-01-07 | Disposition: A | Discharge status: Home / Self Care | Payer: 59 | Source: Ambulatory Visit | Attending: Physician Assistant | Admitting: Physician Assistant

## 2024-01-07 DIAGNOSIS — Z1231 Encounter for screening mammogram for malignant neoplasm of breast: Secondary | ICD-10-CM | POA: Insufficient documentation

## 2024-01-09 ENCOUNTER — Encounter: Payer: Self-pay | Admitting: Physician Assistant

## 2024-01-17 ENCOUNTER — Ambulatory Visit (INDEPENDENT_AMBULATORY_CARE_PROVIDER_SITE_OTHER): Payer: 59 | Admitting: Gastroenterology

## 2024-01-17 ENCOUNTER — Encounter (INDEPENDENT_AMBULATORY_CARE_PROVIDER_SITE_OTHER): Payer: Self-pay | Admitting: Gastroenterology

## 2024-01-17 VITALS — BP 105/72 | HR 75 | Temp 97.7°F | Ht 66.5 in | Wt 213.0 lb

## 2024-01-17 DIAGNOSIS — K5900 Constipation, unspecified: Secondary | ICD-10-CM | POA: Diagnosis not present

## 2024-01-17 DIAGNOSIS — K219 Gastro-esophageal reflux disease without esophagitis: Secondary | ICD-10-CM

## 2024-01-17 MED ORDER — PANTOPRAZOLE SODIUM 40 MG PO TBEC: 40.0000 mg | DELAYED_RELEASE_TABLET | Freq: Every day | ORAL | 3 refills | Status: DC

## 2024-01-17 NOTE — Patient Instructions (Signed)
-  continue protonix 40mg  daily -good reflux precautions to include avoiding greasy, spicy, tomato/citrus based foods, caffeine, alcohol and chocolate, avoid eating late and stay upright 2-3 hours after eating prior to laying down  -Increase water intake, aim for atleast 64 oz per day Increase fruits, veggies and whole grains, kiwi and prunes are especially good for constipation -start benefiber 1T twice daily with a meal   Follow up 1 year   It was a pleasure to see you today. I want to create trusting relationships with patients and provide genuine, compassionate, and quality care. I truly value your feedback! please be on the lookout for a survey regarding your visit with me today. I appreciate your input about our visit and your time in completing this!    Loden Laurent L. Jeanmarie Hubert, MSN, APRN, AGNP-C Adult-Gerontology Nurse Practitioner [facility] Gastroenterology at [facility]

## 2024-01-17 NOTE — Progress Notes (Addendum)
Referring Provider: Grooms, Rulon Eisenmenger Primary Care Physician:  Grooms, Toni Amend, [place] Primary GI Physician: Dr. Levon Hedger   Chief Complaint  Patient presents with   Gastroesophageal Reflux    Follow up on GERD. Food does come back up some nights. Tries not to eat late at night. Takes protonix in the mornings and if needed takes a second one before bedtime. And that does help. Gastric empty study was going to cost $2000 so she did not have done.    HPI:   Brenda [name] is a 53 y.o. female with past medical history of asthma and GERD  Patient presenting today for follow up of GERD and constipation   Last seen June 2024, virtually, at that time patient reported GERD was well-controlled on 40 mg daily, still having some early satiety at times denies nausea or vomiting.  Try to lose some weight.  Still thinking about TIF procedure as previously discussed with Dr. Levon Hedger.  Having some issues of constipation, having a BM daily but still having to strain some some noted blood on toilet tissue at times.  Patient remained to schedule gastric emptying study, continue Protonix 40 mg daily, start MiraLAX, avoid straining, good reflux precautions  Present: States she did well part of the year last year when she was trying to change her diet and then she fell off and started eating bad and having more reflux symptoms but now is trying to eat better again. Has lost some weight changing her diet.   She states if she eats around 2 hours before she goes to bed and lays down, she feels that food sits in the top of her stomach and sometimes. She feels like GERD is pretty well controlled on protonix 40mg  daily if she eats right and avoids eating late. She occasionally repeats her PPI dose, maybe 5-6 times per month. Denies dysphagia or odynophagia.   She has occasional nausea but reports this is maybe 3 times per month. Could not do GES due to the cost. She states her recent labs showed pre diabetes and  high cholesterol so she has an appt coming up with nutritionist.   Having a BM usually atleast a small amount per day but still straining some when she goes. Trying to get back to doing good water intake with two 16 oz bottles per day. She uses miralax when her constipation gets really bad.   Patient denies melena, hematochezia,  vomiting, diarrhea, dysphagia, odyonophagia, early satiety or weight loss.   Last Colonoscopy:03/01/20 one small polyp in cecum  Last EGD 06/2022:- 2 cm hiatal hernia. - Gastroesophageal flap valve classified as Hill Grade IV (no fold, wide open lumen, hiatal hernia  present). - Normal stomach. Biopsied.Mild chronic active gastritis with focal intestinal metaplasia. Immunohistochemistry for Helicobacter pylori is negative.  Alcian blue stain highlights intestinal metaplasia.  - Normal. Repeat EGD 3 years for gastric mapping   Past Medical History:  Diagnosis Date   Asthma    GERD (gastroesophageal reflux disease)    Seasonal allergies     Past Surgical History:  Procedure Laterality Date   ABDOMINAL HYSTERECTOMY     BACK SURGERY     3-4 yrs ago.   BIOPSY  03/01/2020   Procedure: BIOPSY;  Surgeon: Malissa Hippo, MD;  [facility];  Service: Endoscopy;;   BIOPSY  06/29/2022   Procedure: BIOPSY;  Surgeon: Dolores Frame, MD;  [facility];  Service: Gastroenterology;;   BLADDER SURGERY  incontinence   BREAST REDUCTION SURGERY Bilateral 08/30/2015   Procedure: BREAST REDUCTION WITH LIPOSUCTION;  Surgeon: Louisa Second, MD;  Location: [facility];  Service: Plastics;  Laterality: Bilateral;   COLONOSCOPY N/A 03/01/2020   Procedure: COLONOSCOPY;  Surgeon: Malissa Hippo, MD;  [facility];  Service: Endoscopy;  Laterality: N/A;  730   EGD/ED     5 yr ago by Dr. Karilyn Cota   ESOPHAGEAL DILATION N/A 09/05/2017   Procedure: ESOPHAGEAL DILATION;  Surgeon: Malissa Hippo, MD;  [facility];   Service: Endoscopy;  Laterality: N/A;   ESOPHAGOGASTRODUODENOSCOPY N/A 09/05/2017   Procedure: ESOPHAGOGASTRODUODENOSCOPY (EGD);  Surgeon: Malissa Hippo, MD;  [facility];  Service: Endoscopy;  Laterality: N/A;  1:00   ESOPHAGOGASTRODUODENOSCOPY (EGD) WITH PROPOFOL N/A 06/29/2022   Procedure: ESOPHAGOGASTRODUODENOSCOPY (EGD) WITH PROPOFOL;  Surgeon: Dolores Frame, MD;  [facility];  Service: Gastroenterology;  Laterality: N/A;  205 ASA 1   HIGH RISK BREAST EXCISION Right 2011   REDUCTION MAMMAPLASTY Bilateral 2016    Current Outpatient Medications  Medication Sig Dispense Refill   albuterol (PROAIR HFA) 108 (90 Base) MCG/ACT inhaler Inhale 2 puffs into the lungs every 4 (four) hours as needed for wheezing or shortness of breath. 16 g 3   ASHWAGANDHA PO Take by mouth. Takes one a day     fluticasone (FLOVENT HFA) 44 MCG/ACT inhaler Inhale 2 puffs into the lungs daily. 1 each 5   ibuprofen (ADVIL) 800 MG tablet Take 1 tablet (800 mg total) by mouth every 8 (eight) hours as needed. 90 tablet 5   loratadine-pseudoephedrine (CLARITIN-D 24-HOUR) 10-240 MG 24 hr tablet Take 1 tablet by mouth daily.     Lysine HCl 1000 MG TABS Take by mouth. One daily     Multiple Vitamin (MULTIVITAMIN) tablet Take 1 tablet by mouth daily.     pantoprazole (PROTONIX) 40 MG tablet Take 1 tablet (40 mg total) by mouth daily. 90 tablet 3   Spacer/Aero-Holding Rudean Curt For use with prescribed inhalers 1 each 0   No current facility-administered medications for this visit.    Allergies as of 01/17/2024 - Review Complete 01/17/2024  Allergen Reaction Noted   Omeprazole Swelling 06/08/2022   Other Other (See Comments) 09/21/2015   Adhesive [tape] Rash 08/25/2015   Latex Rash 08/25/2015    Family History  Problem Relation Age of Onset   Cancer Mother 66       endometrial cancer and cervical cancer    Cancer Maternal Grandmother        prostate cancer    Social History    Socioeconomic History   Marital status: Married    Spouse name: Not on file   Number of children: Not on file   Years of education: Not on file   Highest education level: Not on file  Occupational History   Not on file  Tobacco Use   Smoking status: Never    Passive exposure: Never   Smokeless tobacco: Never  Vaping Use   Vaping status: Never Used  Substance and Sexual Activity   Alcohol use: Yes    Comment: rarely    Drug use: No   Sexual activity: Yes    Birth control/protection: None  Other Topics Concern   Not on file  Social History Narrative   Not on file   Social Drivers of Health   Financial Resource Strain: Low Risk  (08/31/2021)   Overall Financial Resource Strain (CARDIA)  Difficulty of Paying Living Expenses: Not very hard  Food Insecurity: Unknown (08/31/2021)   Hunger Vital Sign    Worried About Running Out of Food in the Last Year: Patient declined    Ran Out of Food in the Last Year: Patient declined  Transportation Needs: No Transportation Needs (08/31/2021)   PRAPARE - Administrator, Civil Service (Medical): No    Lack of Transportation (Non-Medical): No  Physical Activity: Insufficiently Active (08/31/2021)   Exercise Vital Sign    Days of Exercise per Week: 2 days    Minutes of Exercise per Session: 30 min  Stress: Stress Concern Present (08/31/2021)   [employer] of Occupational Health - Occupational Stress Questionnaire    Feeling of Stress : Very much  Social Connections: Moderately Integrated (08/31/2021)   Social Connection and Isolation Panel [NHANES]    Frequency of Communication with Friends and Family: More than three times a week    Frequency of Social Gatherings with Friends and Family: Three times a week    Attends Religious Services: 1 to 4 times per year    Active Member of Clubs or Organizations: No    Attends Engineer, structural: Never    Marital Status: Married    Review of systems General:  negative for malaise, night sweats, fever, chills, weight loss Neck: Negative for lumps, goiter, pain and significant neck swelling Resp: Negative for cough, wheezing, dyspnea at rest CV: Negative for chest pain, leg swelling, palpitations, orthopnea GI: denies melena, hematochezia, nausea, vomiting, diarrhea, dysphagia, odyonophagia, early satiety or unintentional weight loss. +Constipation  The remainder of the review of systems is noncontributory.  Physical Exam: BP 105/72   Pulse 75   Temp 97.7 F (36.5 C) (Oral)   Ht 5' 6.5" (1.689 m)   Wt 213 lb (96.6 kg)   LMP 07/06/2004   BMI 33.86 kg/m  General:   Alert and oriented. No distress noted. Pleasant and cooperative.  Head:  Normocephalic and atraumatic. Eyes:  Conjuctiva clear without scleral icterus. Mouth:  Oral mucosa pink and moist. Good dentition. No lesions. Heart: Normal rate and rhythm, s1 and s2 heart sounds present.  Lungs: Clear lung sounds in all lobes. Respirations equal and unlabored. Abdomen:  +BS, soft, non-tender and non-distended. No rebound or guarding. No HSM or masses noted. Derm: No palmar erythema or jaundice Neurologic:  Alert and  oriented x4 Psych:  Alert and cooperative. Normal mood and affect.  Invalid input(s): "6 MONTHS"   ASSESSMENT: Sivan ALIAHNA STATZER is a 53 y.o. female presenting today for follow up of GERD and Constipation  GERD: See visit well-controlled on Protonix 40 mg as long as she is monitoring her diet and avoiding trigger foods, as well as avoiding eating late.  She is working on her diet again and going to see a nutritionist soon to try and help change her eating habits.  For now we will continue with Protonix 40 mg daily, she does occasionally repeat this dose a few times per month, if she is requiring PPI dosing more often she should let me know so that we can change her prescription.  She is still thinking about TIF procedure will let me know if she plans to proceed with  this.  Constipation: Having a BM almost daily, though notes some harder stools with need to strain.  Trying to increase her water intake and use MiraLAX as needed when constipation feels more significant.  Recommended she continue with good water intake, aim  for at least 4 ounces of water per day, increase fruits, veggies, whole grains in her diet and can start Benefiber 1 tablespoon twice daily with meals.  PLAN:  -continue protonix 40mg  daily -good reflux precautions -Increase water intake, aim for atleast 64 oz per day Increase fruits, veggies and whole grains, kiwi and prunes are especially good for constipation -start benefiber 1T BID with meals   All questions were answered, patient verbalized understanding and is in agreement with plan as outlined above.   Follow Up: 1 year   Albi Rappaport L. Jeanmarie Hubert, MSN, APRN, AGNP-C Adult-Gerontology Nurse Practitioner [facility]  .I have reviewed the note and agree with the APP's assessment as described in this progress note  Katrinka Blazing, MD Gastroenterology and Hepatology [facility]astroenterology

## 2024-01-28 ENCOUNTER — Encounter: Payer: 59 | Attending: Physician Assistant | Admitting: Nutrition

## 2024-01-28 VITALS — Ht 65.5 in | Wt 212.0 lb

## 2024-01-28 DIAGNOSIS — E6609 Other obesity due to excess calories: Secondary | ICD-10-CM | POA: Insufficient documentation

## 2024-01-28 DIAGNOSIS — R7303 Prediabetes: Secondary | ICD-10-CM | POA: Diagnosis present

## 2024-01-28 DIAGNOSIS — E66811 Obesity, class 1: Secondary | ICD-10-CM | POA: Insufficient documentation

## 2024-01-28 NOTE — Patient Instructions (Signed)
 Goals  Do chair exercises 30 minutes 5 times per week Walk 2 miles in 30 minutes 2-3 times per week. B) 6-8 am, L)12-2 D) 507 Do not eat after 7 pm. Work on meal prepping and meal planning. Aim to get in 6--8 hrs of sleep Aim for 25 grams of fiber per day.

## 2024-01-28 NOTE — Progress Notes (Signed)
 Medical Nutrition Therapy  Appointment Start time:  0930  Appointment End time:  1030  Primary concerns today: Pre Diabetes, Obesity  Referral diagnosis: R73.09, E66.9 Preferred learning style: Hands on.  Learning readiness: Ready    NUTRITION ASSESSMENT  53 yr old wfemale referred for Pre DM and obesity. Has hyperlipidemia also. Wants to lose weight and prevent DM. Diet is high in processed saturated fat foods, low in plant based foods and high fiber foods. Is working on drinking more water with a Circul. History of constipation. She is willing to work with Lifestyle Medicine to focus on more whole plant based foods to help reverse pre DM , lose weight and reverse her hyperlipidemia.   Clinical Medical Hx:  Past Medical History:  Diagnosis Date   Asthma    GERD (gastroesophageal reflux disease)    Seasonal allergies     Medications:  Current Outpatient Medications on File Prior to Visit  Medication Sig Dispense Refill   albuterol (PROAIR HFA) 108 (90 Base) MCG/ACT inhaler Inhale 2 puffs into the lungs every 4 (four) hours as needed for wheezing or shortness of breath. 16 g 3   ASHWAGANDHA PO Take by mouth. Takes one a day     fluticasone (FLOVENT HFA) 44 MCG/ACT inhaler Inhale 2 puffs into the lungs daily. 1 each 5   loratadine-pseudoephedrine (CLARITIN-D 24-HOUR) 10-240 MG 24 hr tablet Take 1 tablet by mouth daily.     Lysine HCl 1000 MG TABS Take by mouth. One daily     Multiple Vitamin (MULTIVITAMIN) tablet Take 1 tablet by mouth daily.     pantoprazole (PROTONIX) 40 MG tablet Take 1 tablet (40 mg total) by mouth daily. 90 tablet 3   Spacer/Aero-Holding Rudean Curt For use with prescribed inhalers 1 each 0   ibuprofen (ADVIL) 800 MG tablet Take 1 tablet (800 mg total) by mouth every 8 (eight) hours as needed. 90 tablet 5   No current facility-administered medications on file prior to visit.    Labs:     Latest Ref Rng & Units 12/10/2023    8:38 AM 09/15/2020    8:12  AM 06/05/2019    8:54 AM  CMP  Glucose 70 - 99 mg/dL 92  89  97   BUN 6 - 24 mg/dL 8  10  7    Creatinine 0.57 - 1.00 mg/dL 1.61  0.96  0.45   Sodium 134 - 144 mmol/L 140  141  140   Potassium 3.5 - 5.2 mmol/L 4.5  4.5  4.4   Chloride 96 - 106 mmol/L 100  102  104   CO2 20 - 29 mmol/L 25  26  24    Calcium 8.7 - 10.2 mg/dL 9.5  40.9  9.6   Total Protein 6.0 - 8.5 g/dL 6.9  7.2  6.7   Total Bilirubin 0.0 - 1.2 mg/dL 0.5  0.7  0.3   Alkaline Phos 44 - 121 IU/L 120  108  87   AST 0 - 40 IU/L 23  22  16    ALT 0 - 32 IU/L 19  18  15     Lipid Panel     Component Value Date/Time   CHOL 223 (H) 12/10/2023 0838   TRIG 128 12/10/2023 0838   HDL 65 12/10/2023 0838   CHOLHDL 3.4 12/10/2023 0838   CHOLHDL 4.1 04/21/2014 0753   VLDL 25 04/21/2014 0753   LDLCALC 135 (H) 12/10/2023 0838   LABVLDL 23 12/10/2023 0838   Lab Results  Component  Value Date   HGBA1C 5.8 (H) 12/10/2023    Notable Signs/Symptoms: None  Lifestyle & Dietary Hx Lives with her husband and son.  Estimated daily fluid intake: 60 oz Supplements: Lsine, Ashagonda, MVI Sleep: 3-5 hrs Stress / self-care: none Current average weekly physical activity: Just started chair yoga  24-Hr Dietary Recall First Meal: Boiled eggs 2,  or yogurt with fruit, Coffee with honey Snack:  Second Meal: [place] pepperoni and sausages, Snack:  Third Meal: Chicken, sweet potatoes, peppers, with soy sauce,  Snack: dark chocolate occassionally Beverages: CIrcul water,  Estimated Energy Needs Calories: 1200 Carbohydrate: 135g Protein: 90g Fat: 33g   NUTRITION DIAGNOSIS  NB-1.1 Food and nutrition-related knowledge deficit As related to Pre Dm and obesity.  As evidenced by A1C 5.8% and BMI > 30.   NUTRITION INTERVENTION  Nutrition education (E-1) on the following topics:  Nutrition and  Pre Diabetes education provided on My Plate, CHO counting, meal planning, portion sizes, timing of meals, avoiding snacks between meals unless having  a low blood sugar, target ranges for A1C and blood sugars, signs/symptoms and treatment of hyper/hypoglycemia, monitoring blood sugars, taking medications as prescribed, benefits of exercising 30 minutes per day and prevention of complications of DM. Lifestyle Medicine  - Whole Food, Plant Predominant Nutrition is highly recommended: Eat Plenty of vegetables, Mushrooms, fruits, Legumes, Whole Grains, Nuts, seeds in lieu of processed meats, processed snacks/pastries red meat, poultry, eggs.    -It is better to avoid simple carbohydrates including: Cakes, Sweet Desserts, Ice Cream, Soda (diet and regular), Sweet Tea, Candies, Chips, Cookies, Store Bought Juices, Alcohol in Excess of  1-2 drinks a day, Lemonade,  Artificial Sweeteners, Doughnuts, Coffee Creamers, "Sugar-free" Products, etc, etc.  This is not a complete list.....  Exercise: If you are able: 30 -60 minutes a day ,4 days a week, or 150 minutes a week.  The longer the better.  Combine stretch, strength, and aerobic activities.  If you were told in the past that you have high risk for cardiovascular diseases, you may seek evaluation by your heart doctor prior to initiating moderate to intense exercise programs.    Handouts Provided Include  Lifestyle Medicine handouts  Learning Style & Readiness for Change Teaching method utilized: Visual & Auditory  Demonstrated degree of understanding via: Teach Back  Barriers to learning/adherence to lifestyle change: none  Goals Established by Pt Goals  Do chair exercises 30 minutes 5 times per week Walk 2 miles in 30 minutes 2-3 times per week. B) 6-8 am, L)12-2 D) 507 Do not eat after 7 pm. Work on meal prepping and meal planning. Aim to get in 6--8 hrs of sleep Aim for 25 grams of fiber per day.   MONITORING & EVALUATION Dietary intake, weekly physical activity, and weight in 1 month.  Next Steps  Patient is to work on meal planning and meal prepping with more whole plant based  foods.Marland Kitchen

## 2024-02-06 ENCOUNTER — Encounter: Payer: Self-pay | Admitting: Nutrition

## 2024-03-06 ENCOUNTER — Encounter: Payer: Self-pay | Admitting: Physician Assistant

## 2024-03-06 ENCOUNTER — Ambulatory Visit: Admitting: Physician Assistant

## 2024-03-06 VITALS — BP 132/82 | Temp 98.3°F | Ht 66.5 in | Wt 201.4 lb

## 2024-03-06 DIAGNOSIS — J020 Streptococcal pharyngitis: Secondary | ICD-10-CM | POA: Insufficient documentation

## 2024-03-06 LAB — POCT RAPID STREP A (OFFICE): Rapid Strep A Screen: POSITIVE — AB

## 2024-03-06 MED ORDER — CEFDINIR 300 MG PO CAPS: 300.0000 mg | ORAL_CAPSULE | Freq: Two times a day (BID) | ORAL | 0 refills | Status: AC

## 2024-03-06 NOTE — Assessment & Plan Note (Signed)
 Positive rapid strep: Antibiotics prescribed.  Promote hydration.  Analgesia and fever control with acetaminophen, ibuprofen. Follow up if worsening, fevers persist for > 5 days, severe pain with swallowing or unable to swallow (drooling).

## 2024-03-06 NOTE — Progress Notes (Signed)
   Acute Office Visit  Subjective:     Patient ID: Brenda [name], female    DOB: 01/12/1971, 53 y.o.   MRN: [id number]   Patient presents today with complaints of sore throat, congestion, and runny nose. She reports similar symptoms approximately 3 weeks ago. At that time she was seen in urgent care. Strep, flu, and covid negative. Patient was started on azithromycin. She reports improvement of symptoms. However, over the last week has been experiencing right sided throat pain and swelling as well as allergy symptoms. She denies fevers. She takes Claritin for allergy symptoms.       Review of Systems  Constitutional:  Positive for malaise/fatigue. Negative for fever.  HENT:  Positive for sore throat.   Musculoskeletal:  Negative for myalgias.        Objective:     BP 132/82   Temp 98.3 F (36.8 C) (Oral)   Ht 5' 6.5" (1.689 m)   Wt 201 lb 6.4 oz (91.4 kg)   LMP 07/06/2004   BMI 32.02 kg/m   Physical Exam Vitals reviewed.  Constitutional:      General: She is not in acute distress.    Appearance: Normal appearance.  HENT:     Nose: Nose normal.     Mouth/Throat:     Mouth: Mucous membranes are moist.     Pharynx: Oropharynx is clear. Posterior oropharyngeal erythema present. No oropharyngeal exudate.     Tonsils: 1+ on the right.  Eyes:     Extraocular Movements: Extraocular movements intact.     Conjunctiva/sclera: Conjunctivae normal.  Cardiovascular:     Rate and Rhythm: Normal rate and regular rhythm.     Heart sounds: Normal heart sounds. No murmur heard. Pulmonary:     Effort: Pulmonary effort is normal.     Breath sounds: Normal breath sounds.  Lymphadenopathy:     Cervical: No cervical adenopathy.  Skin:    General: Skin is warm and dry.  Neurological:     General: No focal deficit present.     Mental Status: She is alert and oriented to person, place, and time.  Psychiatric:        Mood and Affect: Mood normal.        Behavior: Behavior normal.      Results for orders placed or performed in visit on 03/06/24  POCT rapid strep A  Result Value Ref Range   Rapid Strep A Screen Positive (A) Negative        Assessment & Plan:  Strep pharyngitis Assessment & Plan: Positive rapid strep: Antibiotics prescribed.  Promote hydration.  Analgesia and fever control with acetaminophen, ibuprofen. Follow up if worsening, fevers persist for > 5 days, severe pain with swallowing or unable to swallow (drooling).   Orders: -     POCT rapid strep A -     Cefdinir; Take 1 capsule (300 mg total) by mouth 2 (two) times daily for 7 days.  Dispense: 14 capsule; Refill: 0     Return if symptoms worsen or fail to improve.  Toni Amend Preethi Scantlebury, PA-C

## 2024-03-24 ENCOUNTER — Encounter: Payer: 59 | Attending: Physician Assistant | Admitting: Nutrition

## 2024-03-24 ENCOUNTER — Encounter: Payer: Self-pay | Admitting: Nutrition

## 2024-03-24 VITALS — Ht 66.5 in | Wt 198.0 lb

## 2024-03-24 DIAGNOSIS — E66811 Obesity, class 1: Secondary | ICD-10-CM | POA: Insufficient documentation

## 2024-03-24 DIAGNOSIS — R7303 Prediabetes: Secondary | ICD-10-CM | POA: Diagnosis present

## 2024-03-24 DIAGNOSIS — E782 Mixed hyperlipidemia: Secondary | ICD-10-CM | POA: Insufficient documentation

## 2024-03-24 DIAGNOSIS — E6609 Other obesity due to excess calories: Secondary | ICD-10-CM | POA: Diagnosis present

## 2024-03-24 NOTE — Progress Notes (Signed)
 Medical Nutrition Therapy  Appointment Start time:  0930  Appointment End time:  1030  Primary concerns today: Pre Diabetes, Obesity  Referral diagnosis: R73.09, E66.9 Preferred learning style: Hands on.  Learning readiness: Ready    NUTRITION ASSESSMENT Follow up She is doing great and making excellent lifestyle changes. Exercising more, getting to bed sooner at night. Changes made: eating chicken and fish and more vegetables and fruit. Cut out processed foods.  Meal planning now. Exercising a lot more now. Drinking water  Feels better. Boiwels are much better. To see PCP In July for lab work. Has lost 14 lbs. Goals sets previously  Do chair exercises 30 minutes 5 times per week-Done Walk 2 miles in 30 minutes 2-3 times per week.-working on it.doing B) 6-8 am, L)12-2 D) 5-7-done Do not eat after 7 pm.-done Work on meal prepping and meal planning.-done Aim to get in 6--8 hrs of sleep- done well. Aim for 25 grams of fiber per day.-getting it done Clinical Medical Hx:  Past Medical History:  Diagnosis Date   Asthma    GERD (gastroesophageal reflux disease)    Seasonal allergies    Wt Readings from Last 3 Encounters:  03/24/24 198 lb (89.8 kg)  03/06/24 201 lb 6.4 oz (91.4 kg)  01/28/24 212 lb (96.2 kg)   Ht Readings from Last 3 Encounters:  03/24/24 5' 6.5" (1.689 m)  03/06/24 5' 6.5" (1.689 m)  01/28/24 5' 5.5" (1.664 m)   Body mass index is 31.48 kg/m. @BMIFA @ Facility age limit for growth %iles is 20 years. Facility age limit for growth %iles is 20 years.  Medications:  Current Outpatient Medications on File Prior to Visit  Medication Sig Dispense Refill   albuterol  (PROAIR  HFA) 108 (90 Base) MCG/ACT inhaler Inhale 2 puffs into the lungs every 4 (four) hours as needed for wheezing or shortness of breath. 16 g 3   ASHWAGANDHA PO Take by mouth. Takes one a day     fluticasone  (FLOVENT  HFA) 44 MCG/ACT inhaler Inhale 2 puffs into the lungs daily. 1 each 5    ibuprofen  (ADVIL ) 800 MG tablet Take 1 tablet (800 mg total) by mouth every 8 (eight) hours as needed. 90 tablet 5   loratadine-pseudoephedrine (CLARITIN-D 24-HOUR) 10-240 MG 24 hr tablet Take 1 tablet by mouth daily.     Lysine HCl 1000 MG TABS Take by mouth. One daily     Multiple Vitamin (MULTIVITAMIN) tablet Take 1 tablet by mouth daily.     pantoprazole  (PROTONIX ) 40 MG tablet Take 1 tablet (40 mg total) by mouth daily. 90 tablet 3   Spacer/Aero-Holding Ismael Maria For use with prescribed inhalers 1 each 0   No current facility-administered medications on file prior to visit.    Labs:     Latest Ref Rng & Units 12/10/2023    8:38 AM 09/15/2020    8:12 AM 06/05/2019    8:54 AM  CMP  Glucose 70 - 99 mg/dL 92  89  97   BUN 6 - 24 mg/dL 8  10  7    Creatinine 0.57 - 1.00 mg/dL 1.61  0.96  0.45   Sodium 134 - 144 mmol/L 140  141  140   Potassium 3.5 - 5.2 mmol/L 4.5  4.5  4.4   Chloride 96 - 106 mmol/L 100  102  104   CO2 20 - 29 mmol/L 25  26  24    Calcium 8.7 - 10.2 mg/dL 9.5  40.9  9.6   Total Protein 6.0 -  8.5 g/dL 6.9  7.2  6.7   Total Bilirubin 0.0 - 1.2 mg/dL 0.5  0.7  0.3   Alkaline Phos 44 - 121 IU/L 120  108  87   AST 0 - 40 IU/L 23  22  16    ALT 0 - 32 IU/L 19  18  15     Lipid Panel     Component Value Date/Time   CHOL 223 (H) 12/10/2023 0838   TRIG 128 12/10/2023 0838   HDL 65 12/10/2023 0838   CHOLHDL 3.4 12/10/2023 0838   CHOLHDL 4.1 04/21/2014 0753   VLDL 25 04/21/2014 0753   LDLCALC 135 (H) 12/10/2023 0838   LABVLDL 23 12/10/2023 0838   Lab Results  Component Value Date   HGBA1C 5.8 (H) 12/10/2023    Notable Signs/Symptoms: None  Lifestyle & Dietary Hx Lives with her husband and son.  Estimated daily fluid intake: 60 oz Supplements: Lsine, Ashagonda, MVI Sleep: 3-5 hrs Stress / self-care: none Current average weekly physical activity: Just started chair yoga  24-Hr Dietary Recall First Meal: Boiled eggs 2,  or yogurt with fruit, Coffee with  honey Snack:  Second Meal: Chicken, veggies, fruit, water  Snack: fruit Third Meal: Meat and vegetables, water  Beverages:  water ,  Estimated Energy Needs Calories: 1200 Carbohydrate: 135g Protein: 90g Fat: 33g   NUTRITION DIAGNOSIS  NB-1.1 Food and nutrition-related knowledge deficit As related to Pre Dm and obesity.  As evidenced by A1C 5.8% and BMI > 30.   NUTRITION INTERVENTION  Nutrition education (E-1) on the following topics:  Nutrition and  Pre Diabetes education provided on My Plate, CHO counting, meal planning, portion sizes, timing of meals, avoiding snacks between meals unless having a low blood sugar, target ranges for A1C and blood sugars, signs/symptoms and treatment of hyper/hypoglycemia, monitoring blood sugars, taking medications as prescribed, benefits of exercising 30 minutes per day and prevention of complications of DM. Lifestyle Medicine  - Whole Food, Plant Predominant Nutrition is highly recommended: Eat Plenty of vegetables, Mushrooms, fruits, Legumes, Whole Grains, Nuts, seeds in lieu of processed meats, processed snacks/pastries red meat, poultry, eggs.    -It is better to avoid simple carbohydrates including: Cakes, Sweet Desserts, Ice Cream, Soda (diet and regular), Sweet Tea, Candies, Chips, Cookies, Store Bought Juices, Alcohol in Excess of  1-2 drinks a day, Lemonade,  Artificial Sweeteners, Doughnuts, Coffee Creamers, "Sugar-free" Products, etc, etc.  This is not a complete list.....  Exercise: If you are able: 30 -60 minutes a day ,4 days a week, or 150 minutes a week.  The longer the better.  Combine stretch, strength, and aerobic activities.  If you were told in the past that you have high risk for cardiovascular diseases, you may seek evaluation by your heart doctor prior to initiating moderate to intense exercise programs.    Handouts Provided Include  Lifestyle Medicine handouts  Learning Style & Readiness for Change Teaching method utilized:  Visual & Auditory  Demonstrated degree of understanding via: Teach Back  Barriers to learning/adherence to lifestyle change: none  Goals Established by Pt Goals Aim to 30 grams of fiber per day Download MyFItnesspa and Mapmywalk apps Aim for 4 vegetables and 3 servings of fruit per day Do the FullPlateliving program online. Lose 3-4 lbs per month A1C less than 5.8% LDL Less than 100 and TG Less than 100.  MONITORING & EVALUATION Dietary intake, weekly physical activity, and weight in 3 month.  Next Steps  Patient is to work on meal planning and  meal prepping with more whole plant based foods.Aaron Aas

## 2024-03-24 NOTE — Patient Instructions (Addendum)
 Goals Aim to 30 grams of fiber per day Download MyFItnesspa and Mapmywalk apps Aim for 4 vegetables and 3 servings of fruit per day Do the FullPlateliving program online. Lose 3-4 lbs per month A1C less than 5.8% LDL Less than 100 and TG Less than 100.

## 2024-05-10 ENCOUNTER — Ambulatory Visit
Admission: EM | Admit: 2024-05-10 | Discharge: 2024-05-10 | Disposition: A | Discharge status: Home / Self Care | Attending: Nurse Practitioner | Admitting: Nurse Practitioner

## 2024-05-10 DIAGNOSIS — J069 Acute upper respiratory infection, unspecified: Secondary | ICD-10-CM | POA: Diagnosis not present

## 2024-05-10 DIAGNOSIS — Z8709 Personal history of other diseases of the respiratory system: Secondary | ICD-10-CM | POA: Diagnosis not present

## 2024-05-10 MED ORDER — METHYLPREDNISOLONE SODIUM SUCC 125 MG IJ SOLR
125.0000 mg | Freq: Once | INTRAMUSCULAR | Status: AC
  Administered 2024-05-10: 125 mg via INTRAMUSCULAR

## 2024-05-10 MED ORDER — PREDNISONE 20 MG PO TABS: 40.0000 mg | ORAL_TABLET | Freq: Every day | ORAL | 0 refills | Status: AC

## 2024-05-10 MED ORDER — FLUTICASONE PROPIONATE 50 MCG/ACT NA SUSP: 2.0000 | Freq: Every day | NASAL | 0 refills | Status: AC

## 2024-05-10 MED ORDER — PSEUDOEPH-BROMPHEN-DM 30-2-10 MG/5ML PO SYRP: 5.0000 mL | ORAL_SOLUTION | Freq: Four times a day (QID) | ORAL | 0 refills | Status: AC | PRN

## 2024-05-10 NOTE — ED Triage Notes (Signed)
 Pt reports she has a cough that is "deep down in her chest" and head congestion x 3 days     Took covid/flu test but neg results

## 2024-05-10 NOTE — ED Provider Notes (Signed)
 [facility]    CSN: [id number] Arrival date & time: 05/10/24  1128      History   Chief Complaint Chief Complaint  Patient presents with   Dizziness   Cough    HPI Brenda [name] is a 53 y.o. female.   The history is provided by the patient.   Patient presents with a 3-day history of cough, chest congestion, headache, and head congestion.  Patient denies fever, chills, ear pain, ear drainage, wheezing, difficulty breathing, chest pain, abdominal pain, nausea, vomiting, diarrhea, or rash.  Patient reports underlying history of asthma and seasonal allergies.  States she has been taking Oscillococcinum for her symptoms.  Reports that she took a home COVID/flu test which was negative.  Past Medical History:  Diagnosis Date   Asthma    GERD (gastroesophageal reflux disease)    Seasonal allergies     Patient Active Problem List   Diagnosis Date Noted   Strep pharyngitis 03/06/2024   Hx of hysterectomy 09/02/2021   Asthma due to seasonal allergies 12/22/2020   Gastroesophageal reflux disease without esophagitis 05/29/2017   Atypical ductal hyperplasia of right breast 11/04/2015   Morbid obesity (HCC) 04/30/2015   Chronic upper back pain 04/30/2015   Asthma, chronic obstructive, with acute exacerbation (HCC) 05/08/2014   Anxiety and depression 04/08/2014   Allergic rhinitis 04/01/2014   SPONDYLOLITHESIS 05/12/2009    Past Surgical History:  Procedure Laterality Date   ABDOMINAL HYSTERECTOMY     BACK SURGERY     3-4 yrs ago.   BIOPSY  03/01/2020   Procedure: BIOPSY;  Surgeon: Ruby Corporal, MD;  Location: [facility];  Service: Endoscopy;;   BIOPSY  06/29/2022   Procedure: BIOPSY;  Surgeon: Urban Garden, MD;  Location: [facility];  Service: Gastroenterology;;   BLADDER SURGERY     incontinence   BREAST REDUCTION SURGERY Bilateral 08/30/2015   Procedure: BREAST REDUCTION WITH LIPOSUCTION;  Surgeon: Phyllis Breeze, MD;  Location:   [facility];  Service: Plastics;  Laterality: Bilateral;   COLONOSCOPY N/A 03/01/2020   Procedure: COLONOSCOPY;  Surgeon: Ruby Corporal, MD;  Location: [facility];  Service: Endoscopy;  Laterality: N/A;  730   EGD/ED     5 yr ago by Dr. Homero Luster   ESOPHAGEAL DILATION N/A 09/05/2017   Procedure: ESOPHAGEAL DILATION;  Surgeon: Ruby Corporal, MD;  Location: [facility];  Service: Endoscopy;  Laterality: N/A;   ESOPHAGOGASTRODUODENOSCOPY N/A 09/05/2017   Procedure: ESOPHAGOGASTRODUODENOSCOPY (EGD);  Surgeon: Ruby Corporal, MD;  Location: [facility];  Service: Endoscopy;  Laterality: N/A;  1:00   ESOPHAGOGASTRODUODENOSCOPY (EGD) WITH PROPOFOL  N/A 06/29/2022   Procedure: ESOPHAGOGASTRODUODENOSCOPY (EGD) WITH PROPOFOL ;  Surgeon: Urban Garden, MD;  Location: [facility];  Service: Gastroenterology;  Laterality: N/A;  205 ASA 1   HIGH RISK BREAST EXCISION Right 2011   REDUCTION MAMMAPLASTY Bilateral 2016    OB History     Gravida  3   Para  2   Term  2   Preterm      AB  1   Living  2      SAB      IAB      Ectopic      Multiple      Live Births               Home Medications    Prior to Admission medications   Medication Sig Start Date End Date Taking? Authorizing  Provider  albuterol  (PROAIR  HFA) 108 (90 Base) MCG/ACT inhaler Inhale 2 puffs into the lungs every 4 (four) hours as needed for wheezing or shortness of breath. 12/11/23   Grooms, Courtney, PA-C  ASHWAGANDHA PO Take by mouth. Takes one a day    [provider]  fluticasone  (FLOVENT  HFA) 44 MCG/ACT inhaler Inhale 2 puffs into the lungs daily. 12/11/23   Grooms, Courtney, PA-C  ibuprofen  (ADVIL ) 800 MG tablet Take 1 tablet (800 mg total) by mouth every 8 (eight) hours as needed. 06/12/23   Pleasant Brilliant, MD  loratadine-pseudoephedrine (CLARITIN-D 24-HOUR) 10-240 MG 24 hr tablet Take 1 tablet by mouth daily.    [provider]  Lysine HCl 1000 MG TABS Take by mouth. One  daily    [provider]  Multiple Vitamin (MULTIVITAMIN) tablet Take 1 tablet by mouth daily.    [provider]  pantoprazole  (PROTONIX ) 40 MG tablet Take 1 tablet (40 mg total) by mouth daily. 01/17/24   Patterson Bora, NP  Spacer/Aero-Holding Ismael Maria For use with prescribed inhalers 12/11/23   Grooms, [place], PA-C    Family History Family History  Problem Relation Age of Onset   Cancer Mother 72       endometrial cancer and cervical cancer    Cancer Maternal Grandmother        prostate cancer    Social History Social History   Tobacco Use   Smoking status: Never    Passive exposure: Never   Smokeless tobacco: Never  Vaping Use   Vaping status: Never Used  Substance Use Topics   Alcohol use: Yes    Comment: rarely    Drug use: No     Allergies   Omeprazole , Other, Adhesive [tape], and Latex   Review of Systems Review of Systems Per HPI  Physical Exam Triage Vital Signs ED Triage Vitals  Encounter Vitals Group     BP 05/10/24 1134 130/80     Systolic BP Percentile --      Diastolic BP Percentile --      Pulse Rate 05/10/24 1134 87     Resp 05/10/24 1134 18     Temp 05/10/24 1134 98.4 F (36.9 C)     Temp Source 05/10/24 1134 Oral     SpO2 05/10/24 1134 97 %     Weight --      Height --      Head Circumference --      Peak Flow --      Pain Score 05/10/24 1136 8     Pain Loc --      Pain Education --      Exclude from Growth Chart --    No data found.  Updated Vital Signs BP 130/80 (BP Location: Right Arm)   Pulse 87   Temp 98.4 F (36.9 C) (Oral)   Resp 18   LMP 07/06/2004   SpO2 97%   Visual Acuity Right Eye Distance:   Left Eye Distance:   Bilateral Distance:    Right Eye Near:   Left Eye Near:    Bilateral Near:     Physical Exam Vitals and nursing note reviewed.  Constitutional:      General: She is not in acute distress.    Appearance: Normal appearance.  HENT:     Head: Normocephalic.     Right  Ear: Tympanic membrane, ear canal and external ear normal.     Left Ear: Tympanic membrane, ear canal and external ear  normal.     Nose: Congestion present.     Right Turbinates: Enlarged and swollen.     Left Turbinates: Enlarged and swollen.     Right Sinus: No maxillary sinus tenderness or frontal sinus tenderness.     Left Sinus: No maxillary sinus tenderness or frontal sinus tenderness.     Mouth/Throat:     Lips: Pink.     Mouth: Mucous membranes are moist.     Pharynx: Postnasal drip present. No pharyngeal swelling, oropharyngeal exudate, posterior oropharyngeal erythema or uvula swelling.     Comments: Cobblestoning present to posterior oropharynx  Eyes:     Extraocular Movements: Extraocular movements intact.     Conjunctiva/sclera: Conjunctivae normal.     Pupils: Pupils are equal, round, and reactive to light.  Cardiovascular:     Rate and Rhythm: Normal rate and regular rhythm.     Pulses: Normal pulses.     Heart sounds: Normal heart sounds.  Pulmonary:     Effort: Pulmonary effort is normal. No respiratory distress.     Breath sounds: Normal breath sounds. No stridor. No wheezing, rhonchi or rales.  Abdominal:     General: Bowel sounds are normal.     Palpations: Abdomen is soft.     Tenderness: There is no abdominal tenderness.  Musculoskeletal:     Cervical back: Normal range of motion.  Lymphadenopathy:     Cervical: No cervical adenopathy.  Skin:    General: Skin is warm and dry.  Neurological:     General: No focal deficit present.     Mental Status: She is alert and oriented to person, place, and time.  Psychiatric:        Mood and Affect: Mood normal.        Behavior: Behavior normal.      UC Treatments / Results  Labs (all labs ordered are listed, but only abnormal results are displayed) Labs Reviewed - No data to display  EKG   Radiology No results found.  Procedures Procedures (including critical care time)  Medications Ordered in  UC Medications - No data to display  Initial Impression / Assessment and Plan / UC Course  I have reviewed the triage vital signs and the nursing notes.  Pertinent labs & imaging results that were available during my care of the patient were reviewed by me and considered in my medical decision making (see chart for details).  On exam, lung sounds are clear throughout, room air sats at 97%.  Patient with underlying history of asthma.  Symptoms consistent with viral URI with cough that may be exacerbating her asthma.  Solu-Medrol  125 mg IM administered.  Will start patient on Bromfed-DM for the cough, fluticasone  50 micro nasal spray for nasal congestion, and prednisone  40 mg for the next 5 days.  Supportive care recommendations were provided and discussed with the patient to include fluids, rest, normal saline nasal spray, use of a humidifier during sleep.  Discussed indications with patient regarding follow-up.  Patient was in agreement with this plan of care and verbalizes understanding.  All questions were answered.  Patient stable for discharge.   Final Clinical Impressions(s) / UC Diagnoses   Final diagnoses:  None   Discharge Instructions   None    ED Prescriptions   None    PDMP not reviewed this encounter.   Hardy Lia, NP 05/10/24 1154

## 2024-05-10 NOTE — Discharge Instructions (Addendum)
 You were given an injection of Solu-Medrol  125 mg today.  Start the prednisone  on 05/11/2024. Take medication as prescribed. Increase fluids and allow for plenty of rest. May take over-the-counter Tylenol  or Ibuprofen  as needed for pain, fever, or general discomfort. Normal saline nasal spray is recommended throughout the day to help with nasal congestion. Recommend using a humidifier in the bedroom at nighttime during sleep and sleeping elevated on pillows while cough symptoms persist. If symptoms are not improving over next several days, or suddenly worsen, please follow-up in this clinic or with your primary care physician for further evaluation. Follow-up as needed.

## 2024-06-02 ENCOUNTER — Telehealth: Payer: Self-pay

## 2024-06-02 ENCOUNTER — Other Ambulatory Visit: Payer: Self-pay | Admitting: Physician Assistant

## 2024-06-02 DIAGNOSIS — Z13220 Encounter for screening for lipoid disorders: Secondary | ICD-10-CM

## 2024-06-02 DIAGNOSIS — Z0000 Encounter for general adult medical examination without abnormal findings: Secondary | ICD-10-CM

## 2024-06-02 DIAGNOSIS — R7303 Prediabetes: Secondary | ICD-10-CM

## 2024-06-02 NOTE — Telephone Encounter (Signed)
 Reason for CRM: patient called to see if she could come in for her labs before her appt on 7/7. Please f/u with patient

## 2024-06-09 ENCOUNTER — Ambulatory Visit: Payer: 59 | Admitting: Physician Assistant

## 2024-06-09 ENCOUNTER — Encounter: Payer: Self-pay | Admitting: Physician Assistant

## 2024-06-09 VITALS — BP 106/64 | HR 66 | Temp 97.2°F | Ht 66.5 in | Wt 198.0 lb

## 2024-06-09 DIAGNOSIS — R7303 Prediabetes: Secondary | ICD-10-CM

## 2024-06-09 MED ORDER — IBUPROFEN 800 MG PO TABS: 800.0000 mg | ORAL_TABLET | Freq: Three times a day (TID) | ORAL | 5 refills | Status: AC | PRN

## 2024-06-09 NOTE — Assessment & Plan Note (Signed)
 Lab work ordered, recheck A1c. Continue with healthy diet and exercise. Advised whole foods and Mediterranean diet. Discussed increasing physical activity to 150 minutes per week. Follow up in 6 months.

## 2024-06-09 NOTE — Progress Notes (Signed)
   Established Patient Office Visit  Subjective   Patient ID: Brenda [name], female    DOB: 1971-06-29  Age: 53 y.o. MRN: [id number]  Chief Complaint  Patient presents with   Follow-up    6 month f/u     Patient presents today for follow up regarding prediabetes and hyperlipidemia. She reports increased dietary efforts to include eating and healthier diet. She has recently seen diabetic nutrition education for guidance on managing prediabetes. She admits she has not been exercising as frequently as she would like. However, does participates in walking and anaerobic exercise. She does not have concerns or complaints today.      Review of Systems  Constitutional:  Negative for chills, fever and malaise/fatigue.  Eyes:  Negative for blurred vision and double vision.  Respiratory:  Negative for cough and shortness of breath.   Cardiovascular:  Negative for chest pain and palpitations.  Musculoskeletal:  Negative for joint pain and myalgias.  Neurological:  Negative for dizziness and headaches.  Psychiatric/Behavioral:  Negative for depression. The patient is not nervous/anxious.       Objective:     BP 106/64   Pulse 66   Temp (!) 97.2 F (36.2 C)   Ht 5' 6.5 (1.689 m)   Wt 198 lb (89.8 kg)   LMP 07/06/2004   SpO2 99%   BMI 31.48 kg/m    Physical Exam Constitutional:      Appearance: Normal appearance.  HENT:     Head: Normocephalic.     Mouth/Throat:     Mouth: Mucous membranes are moist.     Pharynx: Oropharynx is clear.  Eyes:     Extraocular Movements: Extraocular movements intact.     Conjunctiva/sclera: Conjunctivae normal.  Cardiovascular:     Rate and Rhythm: Normal rate and regular rhythm.     Heart sounds: Normal heart sounds. No murmur heard. Pulmonary:     Effort: Pulmonary effort is normal.     Breath sounds: Normal breath sounds. No wheezing, rhonchi or rales.  Musculoskeletal:     Right lower leg: No edema.     Left lower leg: No edema.   Skin:    General: Skin is warm and dry.  Neurological:     General: No focal deficit present.     Mental Status: She is alert and oriented to person, place, and time.  Psychiatric:        Mood and Affect: Mood normal.        Behavior: Behavior normal.     No results found for any visits on 06/09/24.  The 10-year ASCVD risk score (Arnett DK, et al., 2019) is: 1.2%    Assessment & Plan:   Return in about 6 months (around 12/10/2024).   Prediabetes Assessment & Plan: Lab work ordered, recheck A1c. Continue with healthy diet and exercise. Advised whole foods and Mediterranean diet. Discussed increasing physical activity to 150 minutes per week. Follow up in 6 months.    Other orders -     Ibuprofen ; Take 1 tablet (800 mg total) by mouth every 8 (eight) hours as needed.  Dispense: 90 tablet; Refill: 5    Teddi Badalamenti, PA-C

## 2024-06-12 ENCOUNTER — Ambulatory Visit: Payer: Self-pay | Admitting: Physician Assistant

## 2024-06-12 LAB — CMP14+EGFR
ALT: 14 IU/L (ref 0–32)
AST: 17 IU/L (ref 0–40)
Albumin: 3.9 g/dL (ref 3.8–4.9)
Alkaline Phosphatase: 87 IU/L (ref 44–121)
BUN/Creatinine Ratio: 10 (ref 9–23)
BUN: 8 mg/dL (ref 6–24)
Bilirubin Total: 0.5 mg/dL (ref 0.0–1.2)
CO2: 24 mmol/L (ref 20–29)
Calcium: 9.1 mg/dL (ref 8.7–10.2)
Chloride: 105 mmol/L (ref 96–106)
Creatinine, Ser: 0.84 mg/dL (ref 0.57–1.00)
Globulin, Total: 1.9 g/dL (ref 1.5–4.5)
Glucose: 87 mg/dL (ref 70–99)
Potassium: 4.3 mmol/L (ref 3.5–5.2)
Sodium: 144 mmol/L (ref 134–144)
Total Protein: 5.8 g/dL — ABNORMAL LOW (ref 6.0–8.5)
eGFR: 83 mL/min/1.73 (ref >59)

## 2024-06-12 LAB — CBC WITH DIFFERENTIAL/PLATELET
Basophils Absolute: 0.1 x10E3/uL (ref 0.0–0.2)
Basos: 1 %
EOS (ABSOLUTE): 0.3 x10E3/uL (ref 0.0–0.4)
Eos: 5 %
Hematocrit: 44.2 % (ref 34.0–46.6)
Hemoglobin: 13.7 g/dL (ref 11.1–15.9)
Immature Grans (Abs): 0 x10E3/uL (ref 0.0–0.1)
Immature Granulocytes: 0 %
Lymphocytes Absolute: 1.9 x10E3/uL (ref 0.7–3.1)
Lymphs: 30 %
MCH: 28 pg (ref 26.6–33.0)
MCHC: 31 g/dL — ABNORMAL LOW (ref 31.5–35.7)
MCV: 90 fL (ref 79–97)
Monocytes Absolute: 0.4 x10E3/uL (ref 0.1–0.9)
Monocytes: 6 %
Neutrophils Absolute: 3.6 x10E3/uL (ref 1.4–7.0)
Neutrophils: 58 %
Platelets: 320 x10E3/uL (ref 150–450)
RBC: 4.9 x10E6/uL (ref 3.77–5.28)
RDW: 13.2 % (ref 11.7–15.4)
WBC: 6.3 x10E3/uL (ref 3.4–10.8)

## 2024-06-12 LAB — LIPID PANEL
Chol/HDL Ratio: 3.1 ratio (ref 0.0–4.4)
Cholesterol, Total: 181 mg/dL (ref 100–199)
HDL: 58 mg/dL (ref >39)
LDL Chol Calc (NIH): 106 mg/dL — ABNORMAL HIGH (ref 0–99)
Triglycerides: 94 mg/dL (ref 0–149)
VLDL Cholesterol Cal: 17 mg/dL (ref 5–40)

## 2024-06-12 LAB — HEMOGLOBIN A1C
Est. average glucose Bld gHb Est-mCnc: 108 mg/dL
Hgb A1c MFr Bld: 5.4 % (ref 4.8–5.6)

## 2024-07-21 ENCOUNTER — Encounter: Attending: Physician Assistant | Admitting: Nutrition

## 2024-07-21 ENCOUNTER — Encounter: Payer: Self-pay | Admitting: Nutrition

## 2024-07-21 VITALS — Ht 66.5 in | Wt 199.8 lb

## 2024-07-21 DIAGNOSIS — E6609 Other obesity due to excess calories: Secondary | ICD-10-CM | POA: Insufficient documentation

## 2024-07-21 DIAGNOSIS — E66811 Obesity, class 1: Secondary | ICD-10-CM | POA: Insufficient documentation

## 2024-07-21 DIAGNOSIS — R7303 Prediabetes: Secondary | ICD-10-CM | POA: Insufficient documentation

## 2024-07-21 DIAGNOSIS — E782 Mixed hyperlipidemia: Secondary | ICD-10-CM | POA: Insufficient documentation

## 2024-07-21 NOTE — Progress Notes (Signed)
 Medical Nutrition Therapy 0945 End 10 am.  Primary concerns today: Pre Diabetes, Obesity  Referral diagnosis: R73.09, E66.9 Preferred learning style: Hands on.  Learning readiness: Ready    NUTRITION ASSESSMENT Follow up My clothes are fitting looser. I haven't lost much weight but I feel a lot better. I need to stop weighing every day. It's frustrating.  I donate plasma twice a week. Has been exercising in her pool Was walking before but slowed down due to heat. A1C down to 5.4% from 5.8%. LDL Down to 106 from 135. TCHOL down from 223 to 181 mg/dl. Improvements in labs from changes in lifestyle with diet, exercise and mindful eating.  Has lost 22-30 lbs in the last year.  Goals set previously. Aim to 30 grams of fiber per day-  done Download MyFItnesspa and Mapmywalk apps- done Aim for 4 vegetables and 3 servings of fruit per day-done Do the FullPlateliving program online.-wil ldo it. Lose 3-4 lbs per month- hit a plateau. A1C less than 5.8%-- A1C 5.4% LDL Less than 100 and TG Less than 100; 106 now. Clinical Medical Hx:  Past Medical History:  Diagnosis Date   Asthma    GERD (gastroesophageal reflux disease)    Seasonal allergies    Wt Readings from Last 3 Encounters:  06/09/24 198 lb (89.8 kg)  03/24/24 198 lb (89.8 kg)  03/06/24 201 lb 6.4 oz (91.4 kg)   Ht Readings from Last 3 Encounters:  06/09/24 5' 6.5 (1.689 m)  03/24/24 5' 6.5 (1.689 m)  03/06/24 5' 6.5 (1.689 m)   There is no height or weight on file to calculate BMI. @BMIFA @ Facility age limit for growth %iles is 20 years. Facility age limit for growth %iles is 20 years.  Medications:  Current Outpatient Medications on File Prior to Visit  Medication Sig Dispense Refill   albuterol  (PROAIR  HFA) 108 (90 Base) MCG/ACT inhaler Inhale 2 puffs into the lungs every 4 (four) hours as needed for wheezing or shortness of breath. 16 g 3   ASHWAGANDHA PO Take by mouth. Takes one a day      brompheniramine-pseudoephedrine-DM 30-2-10 MG/5ML syrup Take 5 mLs by mouth 4 (four) times daily as needed. 140 mL 0   fluticasone  (FLONASE ) 50 MCG/ACT nasal spray Place 2 sprays into both nostrils daily. 16 g 0   fluticasone  (FLOVENT  HFA) 44 MCG/ACT inhaler Inhale 2 puffs into the lungs daily. 1 each 5   ibuprofen  (ADVIL ) 800 MG tablet Take 1 tablet (800 mg total) by mouth every 8 (eight) hours as needed. 90 tablet 5   loratadine-pseudoephedrine (CLARITIN-D 24-HOUR) 10-240 MG 24 hr tablet Take 1 tablet by mouth daily.     Lysine HCl 1000 MG TABS Take by mouth. One daily     Multiple Vitamin (MULTIVITAMIN) tablet Take 1 tablet by mouth daily.     pantoprazole  (PROTONIX ) 40 MG tablet Take 1 tablet (40 mg total) by mouth daily. 90 tablet 3   Spacer/Aero-Holding Raguel FRENCH For use with prescribed inhalers 1 each 0   No current facility-administered medications on file prior to visit.    Labs:     Latest Ref Rng & Units 06/11/2024    8:09 AM 12/10/2023    8:38 AM 09/15/2020    8:12 AM  CMP  Glucose 70 - 99 mg/dL 87  92  89   BUN 6 - 24 mg/dL 8  8  10    Creatinine 0.57 - 1.00 mg/dL 9.15  9.07  9.04   Sodium 134 -  144 mmol/L 144  140  141   Potassium 3.5 - 5.2 mmol/L 4.3  4.5  4.5   Chloride 96 - 106 mmol/L 105  100  102   CO2 20 - 29 mmol/L 24  25  26    Calcium 8.7 - 10.2 mg/dL 9.1  9.5  89.8   Total Protein 6.0 - 8.5 g/dL 5.8  6.9  7.2   Total Bilirubin 0.0 - 1.2 mg/dL 0.5  0.5  0.7   Alkaline Phos 44 - 121 IU/L 87  120  108   AST 0 - 40 IU/L 17  23  22    ALT 0 - 32 IU/L 14  19  18     Lipid Panel     Component Value Date/Time   CHOL 181 06/11/2024 0809   TRIG 94 06/11/2024 0809   HDL 58 06/11/2024 0809   CHOLHDL 3.1 06/11/2024 0809   CHOLHDL 4.1 04/21/2014 0753   VLDL 25 04/21/2014 0753   LDLCALC 106 (H) 06/11/2024 0809   LABVLDL 17 06/11/2024 0809   Lab Results  Component Value Date   HGBA1C 5.4 06/11/2024    Notable Signs/Symptoms: None  Lifestyle & Dietary Hx Lives  with her husband and son.  Estimated daily fluid intake: 60 oz Supplements: Lsine, Ashagonda, MVI Sleep: 3-5 hrs Stress / self-care: none Current average weekly physical activity: Just started chair yoga  24-Hr Dietary Recall First Meal: Boiled eggs 2,  or yogurt with fruit, Coffee with honey Snack:  Second Meal: Chicken, veggies, fruit, water  Snack: fruit Third Meal: Meat and vegetables, water  Beverages:  water ,  Estimated Energy Needs Calories: 1200 Carbohydrate: 135g Protein: 90g Fat: 33g   NUTRITION DIAGNOSIS  NB-1.1 Food and nutrition-related knowledge deficit As related to Pre Dm and obesity.  As evidenced by A1C 5.8% and BMI > 30.   NUTRITION INTERVENTION  Nutrition education (E-1) on the following topics:  Nutrition and  Pre Diabetes education provided on My Plate, CHO counting, meal planning, portion sizes, timing of meals, avoiding snacks between meals unless having a low blood sugar, target ranges for A1C and blood sugars, signs/symptoms and treatment of hyper/hypoglycemia, monitoring blood sugars, taking medications as prescribed, benefits of exercising 30 minutes per day and prevention of complications of DM. Lifestyle Medicine  - Whole Food, Plant Predominant Nutrition is highly recommended: Eat Plenty of vegetables, Mushrooms, fruits, Legumes, Whole Grains, Nuts, seeds in lieu of processed meats, processed snacks/pastries red meat, poultry, eggs.    -It is better to avoid simple carbohydrates including: Cakes, Sweet Desserts, Ice Cream, Soda (diet and regular), Sweet Tea, Candies, Chips, Cookies, Store Bought Juices, Alcohol in Excess of  1-2 drinks a day, Lemonade,  Artificial Sweeteners, Doughnuts, Coffee Creamers, Sugar-free Products, etc, etc.  This is not a complete list.....  Exercise: If you are able: 30 -60 minutes a day ,4 days a week, or 150 minutes a week.  The longer the better.  Combine stretch, strength, and aerobic activities.  If you were told in  the past that you have high risk for cardiovascular diseases, you may seek evaluation by your heart doctor prior to initiating moderate to intense exercise programs.    Handouts Provided Include  Lifestyle Medicine handouts  Learning Style & Readiness for Change Teaching method utilized: Visual & Auditory  Demonstrated degree of understanding via: Teach Back  Barriers to learning/adherence to lifestyle change: none  Goals Established by Pt Goals  Work on meal prepping more Plan better for the holidays Eat 5 fruits  and vegetables per day Do the Full Plate Living Program to get re focused. Don't weigh daily- only once per week if desired. More consistent exercising Donate plasma only once a week Deatrice Shoulder- Get out of debt  MONITORING & EVALUATION Dietary intake, weekly physical activity, and weight in 3 month.  Next Steps  Patient is to work on meal planning and meal prepping with more whole plant based foods.SABRA

## 2024-07-21 NOTE — Patient Instructions (Addendum)
 Goals  Work on meal prepping more Plan better for the holidays Eat 5 fruits and vegetables per day Do the Full Plate Living Program to get re focused. Don't weigh daily- only once per week if desired. More consistent exercising Donate plasma only once a week Brenda [name] Shoulder- Get out of debt

## 2024-07-25 ENCOUNTER — Encounter: Payer: Self-pay | Admitting: Radiology

## 2024-08-03 ENCOUNTER — Other Ambulatory Visit (INDEPENDENT_AMBULATORY_CARE_PROVIDER_SITE_OTHER): Payer: Self-pay | Admitting: Gastroenterology

## 2024-09-17 ENCOUNTER — Encounter (INDEPENDENT_AMBULATORY_CARE_PROVIDER_SITE_OTHER): Payer: Self-pay | Admitting: Gastroenterology

## 2024-10-06 ENCOUNTER — Encounter: Payer: Self-pay | Admitting: Radiology

## 2024-11-17 ENCOUNTER — Ambulatory Visit: Admitting: Nutrition
